# Patient Record
Sex: Male | Born: 1950 | Race: Black or African American | Hispanic: No | Marital: Single | Smoking: Current every day smoker
Health system: Southern US, Community
[De-identification: ages and names within clinical notes are randomized; demographics above are authoritative.]

## PROBLEM LIST (undated history)

## (undated) DIAGNOSIS — I10 Essential (primary) hypertension: Secondary | ICD-10-CM

## (undated) DIAGNOSIS — Z972 Presence of dental prosthetic device (complete) (partial): Secondary | ICD-10-CM

## (undated) DIAGNOSIS — E119 Type 2 diabetes mellitus without complications: Secondary | ICD-10-CM

## (undated) DIAGNOSIS — Z8673 Personal history of transient ischemic attack (TIA), and cerebral infarction without residual deficits: Secondary | ICD-10-CM

## (undated) DIAGNOSIS — E785 Hyperlipidemia, unspecified: Secondary | ICD-10-CM

## (undated) DIAGNOSIS — J449 Chronic obstructive pulmonary disease, unspecified: Secondary | ICD-10-CM

## (undated) DIAGNOSIS — K219 Gastro-esophageal reflux disease without esophagitis: Secondary | ICD-10-CM

## (undated) DIAGNOSIS — G4733 Obstructive sleep apnea (adult) (pediatric): Secondary | ICD-10-CM

## (undated) HISTORY — DX: Essential (primary) hypertension: I10

## (undated) HISTORY — DX: Gastro-esophageal reflux disease without esophagitis: K21.9

## (undated) HISTORY — DX: Type 2 diabetes mellitus without complications: E11.9

## (undated) HISTORY — DX: Obstructive sleep apnea (adult) (pediatric): G47.33

## (undated) HISTORY — DX: Personal history of transient ischemic attack (TIA), and cerebral infarction without residual deficits: Z86.73

## (undated) HISTORY — DX: Hyperlipidemia, unspecified: E78.5

---

## 2001-08-03 DIAGNOSIS — Z8673 Personal history of transient ischemic attack (TIA), and cerebral infarction without residual deficits: Secondary | ICD-10-CM

## 2001-08-03 HISTORY — DX: Personal history of transient ischemic attack (TIA), and cerebral infarction without residual deficits: Z86.73

## 2002-04-27 ENCOUNTER — Inpatient Hospital Stay (HOSPITAL_COMMUNITY): Admission: EM | Admit: 2002-04-27 | Discharge: 2002-05-02 | Payer: Self-pay | Admitting: Emergency Medicine

## 2002-04-27 ENCOUNTER — Encounter: Payer: Self-pay | Admitting: *Deleted

## 2002-04-27 ENCOUNTER — Encounter: Payer: Self-pay | Admitting: Emergency Medicine

## 2002-04-28 ENCOUNTER — Encounter: Payer: Self-pay | Admitting: Neurology

## 2002-05-01 ENCOUNTER — Encounter: Payer: Self-pay | Admitting: *Deleted

## 2002-05-02 ENCOUNTER — Inpatient Hospital Stay (HOSPITAL_COMMUNITY)
Admission: RE | Admit: 2002-05-02 | Discharge: 2002-05-10 | Payer: Self-pay | Admitting: Physical Medicine & Rehabilitation

## 2003-03-07 ENCOUNTER — Encounter: Payer: Self-pay | Admitting: Emergency Medicine

## 2003-03-07 ENCOUNTER — Emergency Department (HOSPITAL_COMMUNITY): Admission: EM | Admit: 2003-03-07 | Discharge: 2003-03-07 | Payer: Self-pay | Admitting: Emergency Medicine

## 2004-04-11 ENCOUNTER — Ambulatory Visit: Payer: Self-pay | Admitting: Internal Medicine

## 2004-04-29 ENCOUNTER — Ambulatory Visit: Payer: Self-pay | Admitting: Internal Medicine

## 2004-05-28 ENCOUNTER — Encounter
Admission: RE | Admit: 2004-05-28 | Discharge: 2004-08-26 | Payer: Self-pay | Admitting: Physical Medicine & Rehabilitation

## 2004-05-30 ENCOUNTER — Ambulatory Visit: Payer: Self-pay | Admitting: Physical Medicine & Rehabilitation

## 2004-06-09 ENCOUNTER — Ambulatory Visit: Payer: Self-pay | Admitting: Physical Medicine & Rehabilitation

## 2004-07-08 ENCOUNTER — Ambulatory Visit: Payer: Self-pay | Admitting: Internal Medicine

## 2004-09-30 ENCOUNTER — Ambulatory Visit: Payer: Self-pay | Admitting: Internal Medicine

## 2005-04-21 ENCOUNTER — Ambulatory Visit (HOSPITAL_COMMUNITY): Admission: RE | Admit: 2005-04-21 | Discharge: 2005-04-21 | Payer: Self-pay | Admitting: Gastroenterology

## 2005-05-20 ENCOUNTER — Encounter: Admission: RE | Admit: 2005-05-20 | Discharge: 2005-05-20 | Payer: Self-pay | Admitting: Gastroenterology

## 2005-06-04 ENCOUNTER — Ambulatory Visit (HOSPITAL_COMMUNITY): Admission: RE | Admit: 2005-06-04 | Discharge: 2005-06-04 | Payer: Self-pay | Admitting: Gastroenterology

## 2010-03-20 ENCOUNTER — Encounter: Admission: RE | Admit: 2010-03-20 | Discharge: 2010-03-20 | Payer: Self-pay | Admitting: Internal Medicine

## 2010-12-19 NOTE — Group Therapy Note (Signed)
Terry Andrews is a 60 year old adult male referred to this office by  [facility], specifically Dr. Philipp Deputy, for evaluation and treatment of left  foot numbness.  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  A April 28, 2002, MRI scan showed a moderate sized,  acute, non-hemorrhagic infarct of the right pons.  He was hospitalized  April 27, 2002 through May 10, 2002.  He was discharged on aspirin  and Plavix.  He has not been seen in this office since June 09, 2002.  The patient has been receiving health care through [facility] with Dr.  Philipp Deputy.  He has persisted with lateral foot numbness on the left side since  the time of the stroke.  He was started on Neurontin, by Dr. Philipp Deputy,  approximately five weeks ago and has increased to 300 mg t.i.d.  That has  been present for about a month now but his numbness is questionably  improved.  He is really not a good historian in terms of noting any  improvement with the Neurontin medication.  He is interested in evaluation  for that numbness.  He is also interested in possible nerve conduction  studies, although he is not interested in an EMG study.  He has been told by  Dr. Philipp Deputy that the nerve conduction study could be done.  He seems to have  an understanding that the nerve conduction study would be a therapeutic  test, but I explained to him that it is only diagnostic.  The numbness is  located over his left lateral foot, over the dorsum of his foot, and he  reports that it is somewhat bothersome.  Again, he is not sure if he has had  any improvement on the Neurontin medication used 900 mg a day.  He has had a  recent hemoglobin A1c measured at 6.6, November 2004.   PAST MEDICAL HISTORY:  1.  Hypertension.  2.  Positive tobacco abuse.  3.  Positive drug use.  4.  Borderline diabetes mellitus.   MEDICATIONS:  1.  Neurontin 300 mg t.i.d.  2.  Plavix 75 mg every day.  3.  Norvasc 10 mg every  day.  4.  Enalapril 10 mg every day.  5.  Hydrochlorothiazide 25 mg every day.  6.  Aspirin 81 mg every day.   SOCIAL HISTORY:  The patient lives with his fiancee in [place].  He was  independent prior to this stroke.  He is not employed at the present time.  He previously installed floors.   ALLERGIES:  No known drug allergies.   PHYSICAL EXAMINATION:  GENERAL:  A well-appearing adult male.  VITAL SIGNS:  Blood pressure 154/81, with a pulse of 75, respiratory rate  16, and O2 saturation 97% on room air.  MUSCULOSKELETAL:  Right sided strength was 5/5 throughout.  Bulk and tone  were normal and reflexes were 2+ and symmetrical.  Left arm strength was 5-  to 5 over 5 throughout.  Bulk and tone were normal and reflexes were 2+ and  symmetrical.  Left lower extremity strength was 5-/5 in hip flexion and knee  extension.  Sensation was intact to light touch throughout the entire left  lower extremity  with the exception of the dorsum of his left foot.  Reflexes were 2+ and symmetrical.  He ambulates without any assistive  device.   IMPRESSION:  Left lateral foot numbness, questionably related to prior  stroke with borderline diabetes mellitus noted.  At  the present time we have  requested a nerve conduction study of his left lower extremity  to rule out  small fiber neuropathy.  He does have a hemoglobin A1c with only mildly  borderline back in November.  It does appear that the numbness has been  present since the time of the stroke and probably is related to that.   In any event, we will proceed with treatment at the present time.  I have  asked him to stop the Neurontin, and we are going to try Lyrica at 75 mg  twice a day.  It is a new medication that may help him with his neuropathy,  may help the numbness.  We will see how he does, and I have asked him to  monitor the status of his numbness well over the next month to see if it is  any better or worse compared to the Neurontin  medication.  No other  treatment is recommended at the present time.  We will see what the results  of the nerve conduction study are.  We have not suggested EMG study as he is  not in need of the study and would not tolerate that well.  We have given  him samples of the Lyrica in the office today.  This should cover him for  the next month and we will plan on seeing him in followup in approximately  one to two month's time.      DC/MedQ  D:  05/30/2004 12:24:00  T:  05/30/2004 15:38:59  Job #:  [id number]

## 2013-06-05 ENCOUNTER — Ambulatory Visit
Admission: RE | Admit: 2013-06-05 | Discharge: 2013-06-05 | Disposition: A | Discharge status: Home / Self Care | Payer: Medicare Other | Source: Ambulatory Visit | Attending: Internal Medicine | Admitting: Internal Medicine

## 2013-06-05 ENCOUNTER — Other Ambulatory Visit: Payer: Self-pay | Admitting: Internal Medicine

## 2013-06-05 DIAGNOSIS — R05 Cough: Secondary | ICD-10-CM

## 2014-09-19 DIAGNOSIS — R7309 Other abnormal glucose: Secondary | ICD-10-CM | POA: Diagnosis not present

## 2014-09-19 DIAGNOSIS — F17200 Nicotine dependence, unspecified, uncomplicated: Secondary | ICD-10-CM | POA: Diagnosis not present

## 2014-09-19 DIAGNOSIS — I119 Hypertensive heart disease without heart failure: Secondary | ICD-10-CM | POA: Diagnosis not present

## 2014-10-15 DIAGNOSIS — I10 Essential (primary) hypertension: Secondary | ICD-10-CM | POA: Diagnosis not present

## 2014-11-15 ENCOUNTER — Encounter: Payer: Self-pay | Admitting: Neurology

## 2014-11-15 ENCOUNTER — Ambulatory Visit (INDEPENDENT_AMBULATORY_CARE_PROVIDER_SITE_OTHER): Payer: Medicare Other | Admitting: Neurology

## 2014-11-15 VITALS — BP 122/80 | HR 74 | Resp 18 | Ht 75.0 in | Wt 268.0 lb

## 2014-11-15 DIAGNOSIS — Z720 Tobacco use: Secondary | ICD-10-CM

## 2014-11-15 DIAGNOSIS — I6350 Cerebral infarction due to unspecified occlusion or stenosis of unspecified cerebral artery: Secondary | ICD-10-CM | POA: Diagnosis not present

## 2014-11-15 DIAGNOSIS — F17200 Nicotine dependence, unspecified, uncomplicated: Secondary | ICD-10-CM

## 2014-11-15 DIAGNOSIS — E669 Obesity, unspecified: Secondary | ICD-10-CM | POA: Diagnosis not present

## 2014-11-15 DIAGNOSIS — G4733 Obstructive sleep apnea (adult) (pediatric): Secondary | ICD-10-CM

## 2014-11-15 NOTE — Patient Instructions (Addendum)
You have obstructive sleep apnea or OSA, and I think we should proceed with a sleep study to determine how severe it is. If you have more than mild OSA, I want you to consider continuation with CPAP. Please remember, the risks and ramifications of moderate to severe obstructive sleep apnea or OSA are: Cardiovascular disease, including congestive heart failure, stroke, difficult to control hypertension, arrhythmias, and even type 2 diabetes has been linked to untreated OSA. Sleep apnea causes disruption of sleep and sleep deprivation in most cases, which, in turn, can cause recurrent headaches, problems with memory, mood, concentration, focus, and vigilance. Most people with untreated sleep apnea report excessive daytime sleepiness, which can affect their ability to drive. Please do not drive if you feel sleepy.  I will see you back after your sleep study to go over the test results and where to go from there. We will call you after your sleep study and to set up an appointment at the time.   Please remember to try to maintain good sleep hygiene, which means: Keep a regular sleep and wake schedule, try not to exercise or have a meal within 2 hours of your bedtime, try to keep your bedroom conducive for sleep, that is, cool and dark, without light distractors such as an illuminated alarm clock, and refrain from watching TV right before sleep or in the middle of the night and do not keep the TV or radio on during the night. Also, try not to use or play on electronic devices at bedtime, such as your cell phone, tablet PC or laptop. If you like to read at bedtime on an electronic device, try to dim the background light as much as possible. Do not eat in the middle of the night.

## 2014-11-15 NOTE — Progress Notes (Signed)
Subjective:    [name] is a 64 y.o. male.  HPI     Star Age, MD, PhD [facility] [address], [address] [place], [place] [address]  Dear Ulice Dash,   I saw your patient, Terry Andrews, upon your kind request in my neurologic clinic today for initial consultation of his obstructive sleep apnea which has been treated with CPAP. The patient is unaccompanied today. As you know, Terry Andrews is a very pleasant 64 year old right-handed gentleman with an underlying medical history of reflux disease, hypertension, smoking, heart disease and history of right pontine stroke in 2003 in the context of medical noncompliance and cocaine use (per patient), with no residual weakness, as well as obesity, who was diagnosed with obstructive sleep apnea about 10 years ago. He was placed on CPAP therapy. He has been compliant with treatment. About 2 or 3 months ago he started noticing that the machine was making a lot of noise and he actually quit using it. You encouraged him to restart using it and he also had a bad cough and bout of congestion which since then has improved but it made it hard for him to be compliant with CPAP therapy at the time. All in all he estimates that he has over the course of 10 years always been compliant with treatment about 85% of the time. He uses nasal pillows. He did not bring his machine today.  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 He is single. He has 2 grown daughters. He is the youngest of a total of 7 children. One sister died of breast cancer. Another sister has breast cancer. His brother has sleep apnea and encouraged him to get tested in the past. He still smokes and is not motivated to quit. He lost some weight with your encouragement. He does not have a set bedtime and wake time routine. He drinks caffeine in the form of coffee, tea and sodas throughout the day. He has  a TV on at night throughout the night typically. He likes to sleep on his sides. He likes to take a nap in the afternoons. He is not known to have any parasomnias. He denies restless leg symptoms, morning headaches and nocturia. He wakes up typically quite well rested when he is able to use CPAP but again lately, his machine has been defective.  His DME company is [facility].   Prior sleep test results are not available for my review today. I reviewed your office note from 10/15/2014 which you kindly included. Prior test results were also reviewed including carotid Doppler studies from 04/13/2011 which showed no significant stenosis of either internal carotid artery. Echocardiogram from 04/16/2011 showed normal EF, moderate LVH, grade 2 diastolic dysfunction. According to your office note he was diagnosed with obstructive sleep apnea on 05/21/2015 with a sleep study. Labs from 06/07/2014 showed normal TSH, A1c of 6.6, total cholesterol of 130, LDL at 71, CMP and CBC normal. EKG from 05/03/2014 showed normal sinus rhythm with borderline first-degree AV block and some PVCs. His CPAP has been nearly 62 years old and he has not had re-evaluation of his OSA and nearly 10 years.  His Past Medical History Is Significant For: Past Medical History  Diagnosis Date  . Hypertension   . GERD (gastroesophageal reflux disease)   . OSA (obstructive sleep apnea)   . H/O: CVA (cerebrovascular accident)  His Past Surgical History Is Significant For: No past surgical history on file.  His Family History Is Significant For: Family History  Problem Relation Age of Onset  . Cancer Mother   . Stroke Father   . Heart disease Father     His Social History Is Significant For: History   Social History  . Marital Status: Single    Spouse Name: N/A  . Number of Children: 2  . Years of Education: [employer]   Social History Main Topics  . Smoking status: Current Every Day Smoker  . Smokeless tobacco:  Not on file  . Alcohol Use: 0.0 oz/week    0 Standard drinks or equivalent per week     Comment: Occasional   . Drug Use: No  . Sexual Activity: Not on file   Other Topics Concern  . None   Social History Narrative   Some caffeine use     His Allergies Are:  No Known Allergies:   His Current Medications Are:  Outpatient Encounter Prescriptions as of 11/15/2014  Medication Sig  . amLODipine (NORVASC) 10 MG tablet   . aspirin 81 MG tablet Take 81 mg by mouth daily.  . Azilsartan-Chlorthalidone 40-25 MG TABS Take by mouth.  . metoprolol succinate (TOPROL-XL) 25 MG 24 hr tablet Take 25 mg by mouth daily.  Marland Kitchen spironolactone (ALDACTONE) 25 MG tablet Take 25 mg by mouth daily.  . [DISCONTINUED] acebutolol (SECTRAL) 400 MG capsule Take 400 mg by mouth 2 (two) times daily.  . [DISCONTINUED] amLODipine-benazepril (LOTREL) 10-20 MG per capsule Take 1 capsule by mouth daily.  . [DISCONTINUED] lansoprazole (PREVACID) 30 MG capsule Take 30 mg by mouth daily at 12 noon.  . [DISCONTINUED] Multiple Vitamin (MULTIVITAMIN) capsule Take 1 capsule by mouth daily.  . [DISCONTINUED] Omega-3 Fatty Acids (FISH OIL PO) Take by mouth.  . [DISCONTINUED] verapamil (COVERA HS) 240 MG (CO) 24 hr tablet Take 240 mg by mouth at bedtime.  :  Review of Systems:  Out of a complete 14 point review of systems, all are reviewed and negative with the exception of these symptoms as listed below:   Review of Systems  Respiratory:       Snoring   Cardiovascular:       Heart murmur    Objective:  Neurologic Exam  Physical Exam Physical Examination:   Filed Vitals:   11/15/14 0832  BP: 122/80  Pulse: 74  Resp: 18   General Examination: The patient is a very pleasant 64 y.o. male in no acute distress. He appears well-developed and well-nourished and well groomed.   HEENT: Normocephalic, atraumatic, pupils are equal, round and reactive to light and accommodation. Funduscopic exam is normal with sharp disc  margins noted. Extraocular tracking is good without limitation to gaze excursion or nystagmus noted. Normal smooth pursuit is noted. Hearing is grossly intact. Tympanic membranes are clear bilaterally. Face is symmetric with normal facial animation and normal facial sensation. Speech is clear with no dysarthria noted. There is no hypophonia. There is no lip, neck/head, jaw or voice tremor. Neck is supple with full range of passive and active motion. There are no carotid bruits on auscultation. Oropharynx exam reveals: mild mouth dryness, adequate dental hygiene and marked airway crowding, due to large tongue, thick soft palate and large uvula. He has a sensitive gag and tonsils are not visualized. Mallampati is class III. Tongue protrudes centrally and palate elevates symmetrically. Neck size is 18.25 inches. He has a Mild overbite. Nasal inspection reveals  no significant nasal mucosal bogginess or redness and no septal deviation.   Chest: Clear to auscultation without wheezing, rhonchi or crackles noted.  Heart: S1+S2+0, regular and normal without murmurs, rubs or gallops noted.   Abdomen: Soft, non-tender and non-distended with normal bowel sounds appreciated on auscultation.  Extremities: There is no pitting edema in the distal lower extremities bilaterally. Pedal pulses are intact.  Skin: Warm and dry without trophic changes noted. There are no varicose veins. Signs of acne/rosacea.  Musculoskeletal: exam reveals no obvious joint deformities, tenderness or joint swelling or erythema.   Neurologically:  Mental status: The patient is awake, alert and oriented in all 4 spheres. His immediate and remote memory, attention, language skills and fund of knowledge are appropriate. There is no evidence of aphasia, agnosia, apraxia or anomia. Speech is clear with normal prosody and enunciation. Thought process is linear. Mood is normal and affect is normal.  Cranial nerves II - XII are as described above  under HEENT exam. In addition: shoulder shrug is normal with equal shoulder height noted. Motor exam: Normal bulk, strength and tone is noted. There is no drift, tremor or rebound. Romberg is negative. Reflexes are 2+ throughout. Babinski: Toes are flexor bilaterally. Fine motor skills and coordination: intact with normal finger taps, normal hand movements, normal rapid alternating patting, normal foot taps and normal foot agility.  Cerebellar testing: No dysmetria or intention tremor on finger to nose testing. Heel to shin is unremarkable bilaterally. There is no truncal or gait ataxia.  Sensory exam: intact to light touch, pinprick, vibration, temperature sense in the upper and lower extremities.  Gait, station and balance: He stands easily. No veering to one side is noted. No leaning to one side is noted. Posture is age-appropriate and stance is narrow based. Gait shows normal stride length and normal pace. No problems turning are noted. He turns en bloc. Tandem walk is  slightly difficult for him  Assessment and Plan:   In summary, Terry Andrews is a very pleasant 64 y.o.-year old male with an underlying medical history of reflux disease, hypertension, smoking, heart disease and history of right pontine stroke in 2003 in the context of medical noncompliance and cocaine use (per patient), with no residual weakness, as well as obesity, who was diagnosed with obstructive sleep apnea about 10 years ago. He was placed on CPAP therapy. He has been compliant with treatment. However, his machinist effective and he needs a new evaluation since he has not had a sleep study and almost 10 years and a new machine because it is almost 62 years old. He is accustomed to using nasal pillows.  I had a long chat with the patient about my findings and the diagnosis of OSA, its prognosis and treatment options. We talked about medical treatments, surgical interventions and non-pharmacological approaches. I explained in  particular the risks and ramifications of untreated moderate to severe OSA, especially with respect to developing cardiovascular disease down the Road, including congestive heart failure, difficult to treat hypertension, cardiac arrhythmias, or stroke. Even type 2 diabetes has, in part, been linked to untreated OSA. Symptoms of untreated OSA include daytime sleepiness, memory problems, mood irritability and mood disorder such as depression and anxiety, lack of energy, as well as recurrent headaches, especially morning headaches. We talked about smoking cessation and trying to maintain a healthy lifestyle in general, as well as the importance of weight control. I encouraged the patient to eat healthy, exercise daily and keep well hydrated, to  keep a scheduled bedtime and wake time routine, to not skip any meals and eat healthy snacks in between meals. I advised the patient not to drive when feeling sleepy.  I recommended the following at this time: sleep study with potential positive airway pressure titration. (We will score hypopneas at 4% and split the sleep study into diagnostic and treatment portion, if the estimated. 2 hour AHI is >20/h).   I explained the sleep test procedure to the patient and also outlined possible surgical and non-surgical treatment options of OSA, including the use of a custom-made dental device (which would require a referral to a specialist dentist or oral surgeon), upper airway surgical options, such as pillar implants, radiofrequency surgery, tongue base surgery, and UPPP (which would involve a referral to an ENT surgeon). Rarely, jaw surgery such as mandibular advancement may be considered.  I also explained the CPAP treatment option to the patient, who indicated that he would be willing to continue with CPAP therapy. I explained the importance of being compliant with PAP treatment, not only for insurance purposes but primarily to improve His symptoms, and for the patient's long  term health benefit, including to reduce His cardiovascular risks. I answered all his questions today and the patient was in agreement. I would like to see him back after the sleep study is completed and encouraged him to call with any interim questions, concerns, problems or updates.   Thank you very much for allowing me to participate in the care of this nice patient. If I can be of any further assistance to you please do not hesitate to call me at [phone].  Sincerely,   Star Age, MD, PhD

## 2014-11-22 ENCOUNTER — Ambulatory Visit (INDEPENDENT_AMBULATORY_CARE_PROVIDER_SITE_OTHER): Payer: Medicare Other | Admitting: Neurology

## 2014-11-22 VITALS — BP 135/73 | HR 86 | Ht 75.0 in | Wt 268.0 lb

## 2014-11-22 DIAGNOSIS — G4733 Obstructive sleep apnea (adult) (pediatric): Secondary | ICD-10-CM | POA: Diagnosis not present

## 2014-11-22 DIAGNOSIS — G4720 Circadian rhythm sleep disorder, unspecified type: Secondary | ICD-10-CM

## 2014-11-22 DIAGNOSIS — G478 Other sleep disorders: Secondary | ICD-10-CM

## 2014-11-22 DIAGNOSIS — G479 Sleep disorder, unspecified: Secondary | ICD-10-CM

## 2014-11-22 DIAGNOSIS — R9431 Abnormal electrocardiogram [ECG] [EKG]: Secondary | ICD-10-CM

## 2014-11-23 NOTE — Sleep Study (Signed)
See scanned document in Encounters tab

## 2014-12-05 ENCOUNTER — Telehealth: Payer: Self-pay | Admitting: Neurology

## 2014-12-05 NOTE — Telephone Encounter (Signed)
Please call and notify the patient that the recent sleep study did not show any significant obstructive sleep apnea. Please inform patient that I would like to go over the details of the study during a follow up appointment and if not already previously scheduled, arrange a followup appointment (please utilize a followu-up slot). Also, route or fax report to Dr. Einar Gip.  Once you have spoken to patient, you can close this encounter.   Thanks,  Star Age, MD, PhD [facility])

## 2014-12-06 NOTE — Telephone Encounter (Signed)
I spoke with patient. He is aware of results and made appt for 6/3. I will fax report to Dr. Einar Gip.

## 2015-01-04 ENCOUNTER — Ambulatory Visit (INDEPENDENT_AMBULATORY_CARE_PROVIDER_SITE_OTHER): Payer: Medicare Other | Admitting: Neurology

## 2015-01-04 ENCOUNTER — Encounter: Payer: Self-pay | Admitting: Neurology

## 2015-01-04 VITALS — BP 128/72 | HR 78 | Resp 16 | Ht 75.0 in | Wt 259.0 lb

## 2015-01-04 DIAGNOSIS — E669 Obesity, unspecified: Secondary | ICD-10-CM

## 2015-01-04 DIAGNOSIS — G4733 Obstructive sleep apnea (adult) (pediatric): Secondary | ICD-10-CM

## 2015-01-04 DIAGNOSIS — I6350 Cerebral infarction due to unspecified occlusion or stenosis of unspecified cerebral artery: Secondary | ICD-10-CM

## 2015-01-04 DIAGNOSIS — Z9989 Dependence on other enabling machines and devices: Principal | ICD-10-CM

## 2015-01-04 NOTE — Patient Instructions (Signed)
I will prescribe a new CPAP machine for you. I have placed an order in the chart and we will fax the order to [facility].   I will see you back in 3 month check up.   Please bring your SD card for [name] to read next week. Leave it with the front desk and put Attn: Dr. Rexene Alberts and Beverlee Nims.

## 2015-01-04 NOTE — Progress Notes (Signed)
Subjective:    [name] is a 64 y.o. male.  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 The patient is unaccompanied today. I first met him on 11/15/2014 at the request of his cardiologist, at which time the patient reported a prior diagnosis of obstructive sleep apnea and treatment with CPAP for the past 10 years. He had noticed problems with his machine. Prior sleep test results were not available for me. His sleep study was over 10 years ago. I suggested he return for a potential split-night sleep study. He ended up having a baseline sleep study on 11/22/2014 and I went over his test results with him in detail today. His sleep efficiency was reduced markedly at 53%. Of note the patient had difficulty maintaining sleep and particularly had a hard time going back to sleep after 2:15 AM. Sleep latency was normal but wake after sleep onset was nearly 3 hours. There was mild to moderate sleep fragmentation. He had an increased percentage of stage II sleep, and a decreased percentage of REM sleep at 9.7%. He had no significant PLMS. He had occasional PVCs on single-lead EKG. He had mild intermittent snoring. Total AHI was borderline at 4.5 per hour, rising to 32.2 per hour during REM sleep and 6.7 per hour in the supine position. He slept mostly in the supine position. Average oxygen saturation was 90% but there were faulty readings as well on the pulse oximeter throughout the night.   Today, 01/04/2015: He reports feeling about the same. He did not bring his machine or SD card. He has no new sleep  related complaints. He has lost a few pounds. He feels better using CPAP. His brother has severe OSA and uses a CPAP too.   Previously:   He was diagnosed with obstructive sleep apnea about 10 years ago. He was placed on CPAP therapy. He has been compliant with treatment. About 2 or 3 months ago he started noticing that the machine was making a lot of noise and he actually quit using it. You encouraged him to restart using it and he also had a bad cough and bout of congestion which since then has improved but it made it hard for him to be compliant with CPAP therapy at the time. All in all he estimates that he has over the course of 10 years always been compliant with treatment about 85% of the time. He uses nasal pillows. He did not bring his machine today.   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 He is single. He has 2 grown daughters. He is the youngest of a total of 7 children. One sister died of breast cancer. Another sister has breast cancer. His brother has sleep apnea and encouraged him to get tested in the past. He still smokes and is not motivated to quit. He lost some weight with your encouragement. He does not have a set bedtime and wake time routine. He drinks caffeine in the form of coffee, tea and sodas throughout the day. He has a TV  on at night throughout the night typically. He likes to sleep on his sides. He likes to take a nap in the afternoons. He is not known to have any parasomnias. He denies restless leg symptoms, morning headaches and nocturia. He wakes up typically quite well rested when he is able to use CPAP but again lately, his machine has been defective.   His DME company is [facility].   Prior sleep test results are not available for my review today. I reviewed your office note from 10/15/2014 which you kindly included. Prior test results were also reviewed including carotid Doppler studies from 04/13/2011  which showed no significant stenosis of either internal carotid artery. Echocardiogram from 04/16/2011 showed normal EF, moderate LVH, grade 2 diastolic dysfunction. According to your office note he was diagnosed with obstructive sleep apnea on 05/21/2015 with a sleep study. Labs from 06/07/2014 showed normal TSH, A1c of 6.6, total cholesterol of 130, LDL at 71, CMP and CBC normal. EKG from 05/03/2014 showed normal sinus rhythm with borderline first-degree AV block and some PVCs. His CPAP has been nearly 62 years old and he has not had re-evaluation of his OSA and nearly 10 years.  His Past Medical History Is Significant For: Past Medical History  Diagnosis Date  . Hypertension   . GERD (gastroesophageal reflux disease)   . OSA (obstructive sleep apnea)   . H/O: CVA (cerebrovascular accident)     Her Past Surgical History Is Significant For: No past surgical history on file.  His Family History Is Significant For: Family History  Problem Relation Age of Onset  . Cancer Mother   . Stroke Father   . Heart disease Father     His Social History Is Significant For: History   Social History  . Marital Status: Single    Spouse Name: N/A  . Number of Children: 2  . Years of Education: [employer]   Social History Main Topics  . Smoking status: Current Every Day Smoker  . Smokeless tobacco: Not on file  . Alcohol Use: 0.0 oz/week    0 Standard drinks or equivalent per week     Comment: Occasional   . Drug Use: No  . Sexual Activity: Not on file   Other Topics Concern  . None   Social History Narrative   Some caffeine use     His Allergies Are:  No Known Allergies:   His Current Medications Are:  Outpatient Encounter Prescriptions as of 01/04/2015  Medication Sig  . amLODipine (NORVASC) 10 MG tablet   . aspirin 81 MG tablet Take 81 mg by mouth daily.  . Azilsartan-Chlorthalidone 40-25 MG TABS Take by mouth.  . multivitamin-iron-minerals-folic acid (CENTRUM) chewable tablet  Chew 1 tablet by mouth daily.  Marland Kitchen spironolactone (ALDACTONE) 25 MG tablet Take 25 mg by mouth daily.  . verapamil (CALAN-SR) 240 MG CR tablet   . [DISCONTINUED] metoprolol succinate (TOPROL-XL) 25 MG 24 hr tablet Take 25 mg by mouth daily.   No facility-administered encounter medications on file as of 01/04/2015.  :  Review of Systems:  Out of a complete 14 point review of systems, all are reviewed and negative with the exception of these symptoms as listed below:  Review of Systems  All other systems reviewed and are negative.   Objective:  Neurologic Exam  Physical Exam Physical Examination:   Filed Vitals:   01/04/15 1137  BP: 128/72  Pulse: 78  Resp: 16   General Examination: The patient is a very  pleasant 64 y.o. male in no acute distress. He appears well-developed and well-nourished and well groomed. He is in good spirits today.  HEENT: Normocephalic, atraumatic, pupils are equal, round and reactive to light and accommodation. Funduscopic exam is normal with sharp disc margins noted. Extraocular tracking is good without limitation to gaze excursion or nystagmus noted. Normal smooth pursuit is noted. Hearing is grossly intact. Face is symmetric with normal facial animation and normal facial sensation. Speech is clear with no dysarthria noted. There is no hypophonia. There is no lip, neck/head, jaw or voice tremor. Neck is supple with full range of passive and active motion. There are no carotid bruits on auscultation. Oropharynx exam reveals: mild mouth dryness, adequate dental hygiene and marked airway crowding, due to large tongue, thick soft palate and large uvula. He has a sensitive gag and tonsils are not visualized. Mallampati is class III. Tongue protrudes centrally and palate elevates symmetrically. He has a Mild overbite.   Chest: Clear to auscultation without wheezing, rhonchi or crackles noted.  Heart: S1+S2+0, regular and normal without murmurs, rubs or gallops noted.    Abdomen: Soft, non-tender and non-distended with normal bowel sounds appreciated on auscultation.  Extremities: There is no pitting edema in the distal lower extremities bilaterally. Pedal pulses are intact.  Skin: Warm and dry without trophic changes noted. There are no varicose veins. Signs of acne/rosacea.  Musculoskeletal: exam reveals no obvious joint deformities, tenderness or joint swelling or erythema.   Neurologically:  Mental status: The patient is awake, alert and oriented in all 4 spheres. His immediate and remote memory, attention, language skills and fund of knowledge are appropriate. There is no evidence of aphasia, agnosia, apraxia or anomia. Speech is clear with normal prosody and enunciation. Thought process is linear. Mood is normal and affect is normal.  Cranial nerves II - XII are as described above under HEENT exam. In addition: shoulder shrug is normal with equal shoulder height noted. Motor exam: Normal bulk, strength and tone is noted. There is no drift, tremor or rebound. Romberg is negative. Reflexes are 2+ throughout. Babinski: Toes are flexor bilaterally. Fine motor skills and coordination: intact with normal finger taps, normal hand movements, normal rapid alternating patting, normal foot taps and normal foot agility.  Cerebellar testing: No dysmetria or intention tremor on finger to nose testing. Heel to shin is unremarkable bilaterally. There is no truncal or gait ataxia.  Sensory exam: intact to light touch.  Gait, station and balance: He stands easily. No veering to one side is noted. No leaning to one side is noted. Posture is age-appropriate and stance is narrow based. Gait shows normal stride length and normal pace. No problems turning are noted. He turns en bloc. Tandem walk is  slightly difficult for him, unchanged.  Assessment and Plan:   In summary, Terry Andrews is a very pleasant 64 year old male with an underlying medical history of reflux disease,  hypertension, smoking, heart disease and history of right pontine stroke in 2003 in the context of medical noncompliance and cocaine use (per patient's report), without evidence of residual weakness, as well as obesity, who  Presents for follow-up consultation of his obstructive sleep apnea. This was originally diagnosed over 10 years ago. He was issued a CPAP machine at the time and he still has the original machine. While he has been compliant with treatment, his machine no longer works properly. He needs a replacement machine. Unfortunately, his latest sleep study did not show enough sleep and  we could not titrate him with CPAP during the study. Today, I suggested we placed him on AutoPap therapy at home and I will see him back in follow-up with a compliance download. I will see him back in about 3 months, sooner if the need arises. I answered all his questions today and he was in agreement. I spent 20 minutes in total face-to-face time with the patient, more than 50% of which was spent in counseling and coordination of care, reviewing test results, reviewing medication and discussing or reviewing the diagnosis of OSA, its prognosis and treatment options.

## 2015-01-08 ENCOUNTER — Encounter: Payer: Self-pay | Admitting: Neurology

## 2015-04-17 ENCOUNTER — Ambulatory Visit: Payer: Medicare Other | Admitting: Neurology

## 2015-05-06 ENCOUNTER — Ambulatory Visit (INDEPENDENT_AMBULATORY_CARE_PROVIDER_SITE_OTHER): Payer: Medicare Other | Admitting: Neurology

## 2015-05-06 ENCOUNTER — Encounter: Payer: Self-pay | Admitting: Neurology

## 2015-05-06 VITALS — BP 122/70 | HR 76 | Resp 18 | Ht 75.0 in | Wt 270.0 lb

## 2015-05-06 DIAGNOSIS — E669 Obesity, unspecified: Secondary | ICD-10-CM | POA: Diagnosis not present

## 2015-05-06 DIAGNOSIS — Z720 Tobacco use: Secondary | ICD-10-CM | POA: Diagnosis not present

## 2015-05-06 DIAGNOSIS — Z9989 Dependence on other enabling machines and devices: Principal | ICD-10-CM

## 2015-05-06 DIAGNOSIS — G4733 Obstructive sleep apnea (adult) (pediatric): Secondary | ICD-10-CM | POA: Diagnosis not present

## 2015-05-06 DIAGNOSIS — F17200 Nicotine dependence, unspecified, uncomplicated: Secondary | ICD-10-CM

## 2015-05-06 DIAGNOSIS — IMO0001 Reserved for inherently not codable concepts without codable children: Secondary | ICD-10-CM

## 2015-05-06 NOTE — Progress Notes (Signed)
Subjective:    [name] is a 64 y.o. male.  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 The patient is unaccompanied today. I last saw him on 01/04/2015, at which time he reported feeling about the same. He did not bring his machine or SD card. He had no new sleep related complaints. He had lost a few pounds. He was feeling better with CPAP and was using his old machine. I suggested we start him on AutoPap therapy and provide him with a new machine. He reported that his brother has severe OSA and uses a CPAP too.   Today, 05/06/2015: I reviewed his CPAP compliance data from 04/01/2015 through 04/30/2015 which is a total of 30 days during which time he used his machine every night except for 1, with percent used days greater than 4 hours at 97%, indicating excellent compliance with an average usage of 6 hours and 38 minutes, residual AHI low at 0.5 per hour, leak acceptable with the 95th percentile at 21.9 L/m, borderline, and 95th percentile of pressure at 8.8 cm, setting of AutoPap pap with a range of 5-15 cm with EPR.  Today, 05/06/2015: He reports doing rather well. He loves his new machine. He is compliant with treatment. He has no new complaints regarding his sleep. He has joint pain particularly in both knees and lower back. He does not see an orthopedic doctor. He sees a Restaurant manager, fast food regularly and his primary care physician he states. He recently saw his cardiologist, Dr. Einar Gip in August and was told to follow-up in one year. Unfortunately, he still smokes. He also smoked some marijuana and  drinks liquor almost on a daily basis but does not get drunk and does not drink during the day he states. He is not ready to quit smoking he states.   Previously:   I first met him on 11/15/2014 at the request of his cardiologist, at which time the patient reported a prior diagnosis of obstructive sleep apnea and treatment with CPAP for the past 10 years. He had noticed problems with his machine. Prior sleep test results were not available for me. His sleep study was over 10 years ago. I suggested he return for a potential split-night sleep study. He ended up having a baseline sleep study on 11/22/2014 and I went over his test results with him in detail today. His sleep efficiency was reduced markedly at 53%. Of note the patient had difficulty maintaining sleep and particularly had a hard time going back to sleep after 2:15 AM. Sleep latency was normal but wake after sleep onset was nearly 3 hours. There was mild to moderate sleep fragmentation. He had an increased percentage of stage II sleep, and a decreased percentage of REM sleep at 9.7%. He had no significant PLMS. He had occasional PVCs on single-lead EKG. He had mild intermittent snoring. Total AHI was borderline at 4.5 per hour, rising to 32.2 per hour during REM sleep and 6.7 per hour in the supine position. He slept mostly in the supine position. Average oxygen saturation was 90% but there were faulty readings as well on the  pulse oximeter throughout the night.    He was diagnosed with obstructive sleep apnea about 10 years ago. He was placed on CPAP therapy. He has been compliant with treatment. About 2 or 3 months ago he started noticing that the machine was making a lot of noise and he actually quit using it. You encouraged him to restart using it and he also had a bad cough and bout of congestion which since then has improved but it made it hard for him to be compliant with CPAP therapy at the time. All in all he estimates that he has over the  course of 10 years always been compliant with treatment about 85% of the time. He uses nasal pillows. He did not bring his machine today.   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 He is single. He has 2 grown daughters. He is the youngest of a total of 7 children. One sister died of breast cancer. Another sister has breast cancer. His brother has sleep apnea and encouraged him to get tested in the past. He still smokes and is not motivated to quit. He lost some weight with your encouragement. He does not have a set bedtime and wake time routine. He drinks caffeine in the form of coffee, tea and sodas throughout the day. He has a TV on at night throughout the night typically. He likes to sleep on his sides. He likes to take a nap in the afternoons. He is not known to have any parasomnias. He denies restless leg symptoms, morning headaches and nocturia. He wakes up typically quite well rested when he is able to use CPAP but again lately, his machine has been defective.   His DME company is [facility].    Prior sleep test results are not available for my review today. I reviewed your office note from 10/15/2014 which you kindly included. Prior test results were also reviewed including carotid Doppler studies from 04/13/2011 which showed no significant stenosis of either internal carotid artery. Echocardiogram from 04/16/2011 showed normal EF, moderate LVH, grade 2 diastolic dysfunction. According to your office note he was diagnosed with obstructive sleep apnea on 05/21/2015 with a sleep study. Labs from 06/07/2014 showed normal TSH, A1c of 6.6, total cholesterol of 130, LDL at 71, CMP and CBC normal. EKG from 05/03/2014 showed normal sinus rhythm with borderline first-degree AV block and some PVCs. His CPAP has been nearly 62 years old and he has not had re-evaluation of his OSA and nearly 10 years.  His Past Medical History Is Significant  For: Past Medical History  Diagnosis Date  . Hypertension   . GERD (gastroesophageal reflux disease)   . OSA (obstructive sleep apnea)   . H/O: CVA (cerebrovascular accident)     His Past Surgical History Is Significant For: No past surgical history on file.  His Family History Is Significant For: Family History  Problem Relation Age of Onset  . Cancer Mother   . Stroke Father   . Heart disease Father     His Social History Is Significant For: Social History   Social History  . Marital Status: Single    Spouse Name: N/A  . Number of Children: 2  . Years of Education: [employer]   Social History Main Topics  . Smoking status: Current Every Day Smoker  . Smokeless tobacco: None  . Alcohol Use: 0.0 oz/week    0 Standard  drinks or equivalent per week     Comment: Occasional   . Drug Use: No  . Sexual Activity: Not Asked   Other Topics Concern  . None   Social History Narrative   Some caffeine use     His Allergies Are:  No Known Allergies:   His Current Medications Are:  Outpatient Encounter Prescriptions as of 05/06/2015  Medication Sig  . aspirin 81 MG tablet Take 81 mg by mouth daily.  . Azilsartan-Chlorthalidone (EDARBYCLOR) 40-25 MG TABS Take by mouth.  . multivitamin-iron-minerals-folic acid (CENTRUM) chewable tablet Chew 1 tablet by mouth daily.  Marland Kitchen spironolactone (ALDACTONE) 25 MG tablet Take 25 mg by mouth daily.  . verapamil (CALAN-SR) 240 MG CR tablet   . [DISCONTINUED] amLODipine (NORVASC) 10 MG tablet   . [DISCONTINUED] Azilsartan-Chlorthalidone 40-25 MG TABS Take by mouth.   No facility-administered encounter medications on file as of 05/06/2015.  :  Review of Systems:  Out of a complete 14 point review of systems, all are reviewed and negative with the exception of these symptoms as listed below:   Review of Systems  Musculoskeletal:       Bilateral knee pain   Neurological:       Patient states that he has gotten a new CPAP machine and is  doing real well on it. No new complaints.     Objective:  Neurologic Exam  Physical Exam Physical Examination:   Filed Vitals:   05/06/15 1049  BP: 122/70  Pulse: 76  Resp: 18   General Examination: The patient is a very pleasant 64 y.o. male in no acute distress. He appears well-developed and well-nourished and well groomed. He is in good spirits today.  HEENT: Normocephalic, atraumatic, pupils are equal, round and reactive to light and accommodation. Extraocular tracking is good without limitation to gaze excursion or nystagmus noted. Normal smooth pursuit is noted. Hearing is grossly intact. Face is symmetric with normal facial animation and normal facial sensation. Speech is clear with no dysarthria noted. There is no hypophonia. There is no lip, neck/head, jaw or voice tremor. Neck is supple with full range of passive and active motion. There are no carotid bruits on auscultation. Oropharynx exam reveals: mild mouth dryness, adequate dental hygiene and marked airway crowding, due to large tongue, thick soft palate and large uvula. He has a sensitive gag and tonsils are not visualized. Mallampati is class III. Tongue protrudes centrally and palate elevates symmetrically. He has a Mild overbite.   Chest: Clear to auscultation without wheezing, rhonchi or crackles noted.  Heart: S1+S2+0, regular and normal without murmurs, rubs or gallops noted.   Abdomen: Soft, non-tender and non-distended with normal bowel sounds appreciated on auscultation.  Extremities: There is no pitting edema in the distal lower extremities bilaterally. Pedal pulses are intact.  Skin: Warm and dry without trophic changes noted. There are no varicose veins. Signs of acne/rosacea.  Musculoskeletal: exam reveals bilateral knee pain. He wears Velcro braces. He has a compression sleeve around his left elbow as he has tenderness in his left elbow he states.    Neurologically:  Mental status: The patient is awake,  alert and oriented in all 4 spheres. His immediate and remote memory, attention, language skills and fund of knowledge are appropriate. There is no evidence of aphasia, agnosia, apraxia or anomia. Speech is clear with normal prosody and enunciation. Thought process is linear. Mood is normal and affect is normal.  Cranial nerves II - XII are as described above under  HEENT exam. In addition: shoulder shrug is normal with equal shoulder height noted. Motor exam: Normal bulk, strength and tone is noted. There is no drift, tremor or rebound. Romberg is negative. Reflexes are 2+ throughout. Fine motor skills and coordination: intact. There is no truncal or gait ataxia.  Sensory exam: intact to light touch.  Gait, station and balance: He stands with mild difficulty secondary to left more than right knee pain. He can stand narrow based. Gait shows normal stride length and normal pace, but a slight limp in the beginning on the left.   Assessment and Plan:   In summary, ABDIAZIZ KLAHN is a very pleasant 64 year old male with an underlying medical history of reflux disease, hypertension, smoking, heart disease and history of right pontine stroke in 2003 in the context of medical noncompliance and cocaine use (per patient's report), without evidence of residual weakness, as well as obesity, who presents for follow-up consultation of his obstructive sleep apnea, now on treatment with AutoPap with a new machine. He was issued to CPAP machine in the past but needed a replacement machine. He had a sleep study in April. He has been compliant with treatment. His physical exam is stable. He is again counseled extensively today. In particular, he is advised to refrain from illicit drugs, stop smoking, drink more water, stay active physically, and be cautious with alcohol use. He is advised to continue using his machine regularly and he is commended for being compliant with treatment. At this juncture, I can see him back in a  year from now. He is encouraged to call with any interim questions or concerns. I answered all his questions today and he was in agreement.  I spent 20 minutes in total face-to-face time with the patient, more than 50% of which was spent in counseling and coordination of care, reviewing test results, reviewing medication and discussing or reviewing the diagnosis of OSA, its prognosis and treatment options.

## 2015-05-06 NOTE — Patient Instructions (Addendum)
Please continue using your auto-PAP regularly. While your insurance requires that you use PAP at least 4 hours each night on 70% of the nights, I recommend, that you not skip any nights and use it throughout the night if you can. Getting used to CPAP and staying with the treatment long term does take time and patience and discipline. Untreated obstructive sleep apnea when it is moderate to severe can have an adverse impact on cardiovascular health and raise her risk for heart disease, arrhythmias, hypertension, congestive heart failure, stroke and diabetes. Untreated obstructive sleep apnea causes sleep disruption, nonrestorative sleep, and sleep deprivation. This can have an impact on your day to day functioning and cause daytime sleepiness and impairment of cognitive function, memory loss, mood disturbance, and problems focussing. Using PAP regularly can improve these symptoms.  Keep up the good work! I will see you back in 12 months for sleep apnea check up.   Please stop smoking, and refrain from illicit drugs, be cautious with alcohol.

## 2015-05-19 DIAGNOSIS — G4733 Obstructive sleep apnea (adult) (pediatric): Secondary | ICD-10-CM | POA: Diagnosis not present

## 2015-06-06 DIAGNOSIS — H2513 Age-related nuclear cataract, bilateral: Secondary | ICD-10-CM | POA: Diagnosis not present

## 2015-06-06 DIAGNOSIS — H43812 Vitreous degeneration, left eye: Secondary | ICD-10-CM | POA: Diagnosis not present

## 2015-06-06 DIAGNOSIS — H25013 Cortical age-related cataract, bilateral: Secondary | ICD-10-CM | POA: Diagnosis not present

## 2015-06-12 DIAGNOSIS — I119 Hypertensive heart disease without heart failure: Secondary | ICD-10-CM | POA: Diagnosis not present

## 2015-06-12 DIAGNOSIS — E669 Obesity, unspecified: Secondary | ICD-10-CM | POA: Diagnosis not present

## 2015-06-12 DIAGNOSIS — Z6835 Body mass index (BMI) 35.0-35.9, adult: Secondary | ICD-10-CM | POA: Diagnosis not present

## 2015-06-12 DIAGNOSIS — R7303 Prediabetes: Secondary | ICD-10-CM | POA: Diagnosis not present

## 2015-06-19 DIAGNOSIS — G4733 Obstructive sleep apnea (adult) (pediatric): Secondary | ICD-10-CM | POA: Diagnosis not present

## 2015-07-19 DIAGNOSIS — G4733 Obstructive sleep apnea (adult) (pediatric): Secondary | ICD-10-CM | POA: Diagnosis not present

## 2015-08-16 DIAGNOSIS — L03312 Cellulitis of back [any part except buttock]: Secondary | ICD-10-CM | POA: Diagnosis not present

## 2015-08-16 DIAGNOSIS — L0390 Cellulitis, unspecified: Secondary | ICD-10-CM | POA: Diagnosis not present

## 2015-08-19 DIAGNOSIS — G4733 Obstructive sleep apnea (adult) (pediatric): Secondary | ICD-10-CM | POA: Diagnosis not present

## 2015-09-13 DIAGNOSIS — R7309 Other abnormal glucose: Secondary | ICD-10-CM | POA: Diagnosis not present

## 2015-09-13 DIAGNOSIS — I119 Hypertensive heart disease without heart failure: Secondary | ICD-10-CM | POA: Diagnosis not present

## 2015-09-13 DIAGNOSIS — R7303 Prediabetes: Secondary | ICD-10-CM | POA: Diagnosis not present

## 2015-09-19 DIAGNOSIS — G4733 Obstructive sleep apnea (adult) (pediatric): Secondary | ICD-10-CM | POA: Diagnosis not present

## 2015-10-17 DIAGNOSIS — G4733 Obstructive sleep apnea (adult) (pediatric): Secondary | ICD-10-CM | POA: Diagnosis not present

## 2015-11-17 DIAGNOSIS — G4733 Obstructive sleep apnea (adult) (pediatric): Secondary | ICD-10-CM | POA: Diagnosis not present

## 2015-12-17 DIAGNOSIS — G4733 Obstructive sleep apnea (adult) (pediatric): Secondary | ICD-10-CM | POA: Diagnosis not present

## 2016-01-02 DIAGNOSIS — I10 Essential (primary) hypertension: Secondary | ICD-10-CM | POA: Diagnosis not present

## 2016-01-02 DIAGNOSIS — E669 Obesity, unspecified: Secondary | ICD-10-CM | POA: Diagnosis not present

## 2016-01-02 DIAGNOSIS — Z0000 Encounter for general adult medical examination without abnormal findings: Secondary | ICD-10-CM | POA: Diagnosis not present

## 2016-01-02 DIAGNOSIS — I119 Hypertensive heart disease without heart failure: Secondary | ICD-10-CM | POA: Diagnosis not present

## 2016-01-02 DIAGNOSIS — R7303 Prediabetes: Secondary | ICD-10-CM | POA: Diagnosis not present

## 2016-02-03 DIAGNOSIS — G4733 Obstructive sleep apnea (adult) (pediatric): Secondary | ICD-10-CM | POA: Diagnosis not present

## 2016-03-13 DIAGNOSIS — I10 Essential (primary) hypertension: Secondary | ICD-10-CM | POA: Diagnosis not present

## 2016-05-05 ENCOUNTER — Encounter: Payer: Self-pay | Admitting: Neurology

## 2016-05-05 ENCOUNTER — Ambulatory Visit (INDEPENDENT_AMBULATORY_CARE_PROVIDER_SITE_OTHER): Payer: Medicare Other | Admitting: Neurology

## 2016-05-05 VITALS — BP 120/62 | HR 70 | Resp 18 | Ht 75.0 in | Wt 268.0 lb

## 2016-05-05 DIAGNOSIS — Z9989 Dependence on other enabling machines and devices: Secondary | ICD-10-CM | POA: Diagnosis not present

## 2016-05-05 DIAGNOSIS — F17200 Nicotine dependence, unspecified, uncomplicated: Secondary | ICD-10-CM

## 2016-05-05 DIAGNOSIS — G4733 Obstructive sleep apnea (adult) (pediatric): Secondary | ICD-10-CM

## 2016-05-05 DIAGNOSIS — I6350 Cerebral infarction due to unspecified occlusion or stenosis of unspecified cerebral artery: Secondary | ICD-10-CM | POA: Diagnosis not present

## 2016-05-05 DIAGNOSIS — E669 Obesity, unspecified: Secondary | ICD-10-CM

## 2016-05-05 NOTE — Patient Instructions (Signed)

## 2016-05-05 NOTE — Progress Notes (Signed)
Subjective:    [name] is a 65 y.o. male.  HPI     Interim history:   Terry Andrews is a very pleasant 65 year old right-handed gentleman with an underlying medical history of reflux disease, hypertension, smoking, heart disease and history of right pontine stroke in 2003 in the context of medical noncompliance and cocaine use (per patient, was hospitalized in 2003), with no residual weakness, as well as obesity, who presents for follow-up consultation of his OSA, on treatment with AutoPap therapy at home. The patient is unaccompanied today. I last saw him on 05/06/2015, at which time he reported doing well. He liked his new CPAP machine. He had some issues with knee pain and back pain. He was seen a chiropractor for this. His cardiologist wanted to see him in one year. He was not quite ready to quit smoking. He was fully compliant with CPAP therapy.  Today, 05/05/2016: I reviewed his autoPAP compliance data from 04/03/2016 through 05/02/2016 which is a total of 30 days, during which time he used his machine 29 days with percent used days greater than 4 hours at 97%, indicating excellent compliance with an average usage of 7 hours and 15 minutes, residual AHI low at 0.3 per hour. 95th percentile pressure at 8.6 cm, leak acceptable with the 95th percentile at 19.1 L/m, pressure setting of 5-15 cm with EPR.  Today, 05/05/2016: He reports doing okay, no problems with autoPAP. Saw Dr. Einar Gip a couple of months ago, good to go for a year.Still smokes about half a pack per day, states he will quit when he is ready. He also smokes marijuana and drinks alcohol regularly, nearly daily. Had a checkup with his primary care physician, is stable, limited medications, takes his baby aspirin. No new stroke symptoms or warning signs. Has lost a little bit of weight. Is working on weight loss.     Previously:    I saw him on 01/04/2015, at which time he reported feeling about the same. He did not  bring his machine or SD card. He had no new sleep related complaints. He had lost a few pounds. He was feeling better with CPAP and was using his old machine. I suggested we start him on AutoPap therapy and provide him with a new machine. He reported that his brother has severe OSA and uses a CPAP too.    I reviewed his CPAP compliance data from 04/01/2015 through 04/30/2015 which is a total of 30 days during which time he used his machine every night except for 1, with percent used days greater than 4 hours at 97%, indicating excellent compliance with an average usage of 6 hours and 38 minutes, residual AHI low at 0.5 per hour, leak acceptable with the 95th percentile at 21.9 L/m, borderline, and 95th percentile of pressure at 8.8 cm, setting of AutoPap pap with a range of 5-15 cm with EPR.     I first met him on 11/15/2014 at the request of his cardiologist, at which time the patient reported a prior diagnosis of obstructive sleep apnea and treatment with CPAP for the past 10 years. He had noticed problems with his machine. Prior sleep test results were not available for me. His sleep study was over 10 years ago. I suggested he return for a potential split-night sleep study. He ended up having a baseline sleep study on 11/22/2014 and I went over his test results with him in detail today. His sleep efficiency was reduced markedly at  53%. Of note the patient had difficulty maintaining sleep and particularly had a hard time going back to sleep after 2:15 AM. Sleep latency was normal but wake after sleep onset was nearly 3 hours. There was mild to moderate sleep fragmentation. He had an increased percentage of stage II sleep, and a decreased percentage of REM sleep at 9.7%. He had no significant PLMS. He had occasional PVCs on single-lead EKG. He had mild intermittent snoring. Total AHI was borderline at 4.5 per hour, rising to 32.2 per hour during REM sleep and 6.7 per hour in the supine position. He slept  mostly in the supine position. Average oxygen saturation was 90% but there were faulty readings as well on the pulse oximeter throughout the night.      He was diagnosed with obstructive sleep apnea about 10 years ago. He was placed on CPAP therapy. He has been compliant with treatment. About 2 or 3 months ago he started noticing that the machine was making a lot of noise and he actually quit using it. You encouraged him to restart using it and he also had a bad cough and bout of congestion which since then has improved but it made it hard for him to be compliant with CPAP therapy at the time. All in all he estimates that he has over the course of 10 years always been compliant with treatment about 85% of the time. He uses nasal pillows. He did not bring his machine today.   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 He is single. He has 2 grown daughters. He is the youngest of a total of 7 children. One sister died of breast cancer. Another sister has breast cancer. His brother has sleep apnea and encouraged him to get tested in the past. He still smokes and is not motivated to quit. He lost some weight with your encouragement. He does not have a set bedtime and wake time routine. He drinks caffeine in the form of coffee, tea and sodas throughout the day. He has a TV on at night throughout the night typically. He likes to sleep on his sides. He likes to take a nap in the afternoons. He is not known to have any parasomnias. He denies restless leg symptoms, morning headaches and nocturia. He wakes up typically quite well rested when he is able to use CPAP but again lately, his machine has been defective.   His DME company is [facility].    Prior sleep test results are not available for my review today. I reviewed your office note from 10/15/2014 which you kindly included. Prior test results were also reviewed including carotid Doppler studies  from 04/13/2011 which showed no significant stenosis of either internal carotid artery. Echocardiogram from 04/16/2011 showed normal EF, moderate LVH, grade 2 diastolic dysfunction. According to your office note he was diagnosed with obstructive sleep apnea on 05/21/2015 with a sleep study. Labs from 06/07/2014 showed normal TSH, A1c of 6.6, total cholesterol of 130, LDL at 71, CMP and CBC normal. EKG from 05/03/2014 showed normal sinus rhythm with borderline first-degree AV block and some PVCs. His CPAP has been nearly 63 years old and he has not had re-evaluation of his OSA and nearly 10 years.   His Past Medical History Is Significant For: Past Medical History:  Diagnosis Date  . GERD (gastroesophageal reflux disease)   . H/O: CVA (cerebrovascular accident)   .  Hypertension   . OSA (obstructive sleep apnea)     His Past Surgical History Is Significant For: No past surgical history on file.  His Family History Is Significant For: Family History  Problem Relation Age of Onset  . Cancer Mother   . Stroke Father   . Heart disease Father     His Social History Is Significant For: Social History   Social History  . Marital status: Single    Spouse name: N/A  . Number of children: 2  . Years of education: College   Social History Main Topics  . Smoking status: Current Every Day Smoker  . Smokeless tobacco: Not on file  . Alcohol use 0.0 oz/week     Comment: Occasional   . Drug use: No  . Sexual activity: Not on file   Other Topics Concern  . Not on file   Social History Narrative   Some caffeine use     His Allergies Are:  No Known Allergies:   His Current Medications Are:  Outpatient Encounter Prescriptions as of 05/05/2016  Medication Sig  . aspirin 81 MG tablet Take 81 mg by mouth daily.  . Azilsartan-Chlorthalidone (EDARBYCLOR) 40-25 MG TABS Take by mouth.  . multivitamin-iron-minerals-folic acid (CENTRUM) chewable tablet Chew 1 tablet by mouth daily.  Marland Kitchen  spironolactone (ALDACTONE) 25 MG tablet Take 25 mg by mouth daily.  . verapamil (CALAN-SR) 240 MG CR tablet    No facility-administered encounter medications on file as of 05/05/2016.   :  Review of Systems:  Out of a complete 14 point review of systems, all are reviewed and negative with the exception of these symptoms as listed below:  Review of Systems  Neurological:       Patient states that he is doing well with new CPAP machine. No new concerns.     Objective:  Neurologic Exam  Physical Exam Physical Examination:   Vitals:   05/05/16 0944  BP: 120/62  Pulse: 70  Resp: 18   General Examination: The patient is a very pleasant 65 y.o. male in no acute distress. He appears well-developed and well-nourished and well groomed. He is in good spirits today.  HEENT: Normocephalic, atraumatic, pupils are equal, round and reactive to light and accommodation. Extraocular tracking is good without limitation to gaze excursion or nystagmus noted. Normal smooth pursuit is noted. Hearing is grossly intact. Face is symmetric with normal facial animation and normal facial sensation. Speech is clear with no dysarthria noted. There is no hypophonia. There is no lip, neck/head, jaw or voice tremor. Neck is supple with full range of passive and active motion. There are no carotid bruits on auscultation. Oropharynx exam reveals: mild mouth dryness, adequate dental hygiene and marked airway crowding, due to large tongue, thick soft palate and large uvula. He has a sensitive gag and tonsils are not visualized. Mallampati is class III. Tongue protrudes centrally and palate elevates symmetrically. He has a Mild overbite.   Chest: Clear to auscultation without wheezing, rhonchi or crackles noted.  Heart: S1+S2+0, regular and normal without murmurs, rubs or gallops noted.   Abdomen: Soft, non-tender and non-distended with normal bowel sounds appreciated on auscultation.  Extremities: There is no pitting  edema in the distal lower extremities bilaterally. Pedal pulses are intact.  Skin: Warm and dry without trophic changes noted. There are no varicose veins. Signs of acne/rosacea.  Musculoskeletal: exam reveals no significant joint pain, some back pain.   Neurologically:  Mental status: The patient is awake,  alert and oriented in all 4 spheres. His immediate and remote memory, attention, language skills and fund of knowledge are appropriate. There is no evidence of aphasia, agnosia, apraxia or anomia. Speech is clear with normal prosody and enunciation. Thought process is linear. Mood is normal and affect is normal.  Cranial nerves II - XII are as described above under HEENT exam. In addition: shoulder shrug is normal with equal shoulder height noted. Motor exam: Normal bulk, strength and tone is noted. There is no drift, tremor or rebound. Romberg is negative. Reflexes are 2+ throughout. Fine motor skills and coordination: intact. There is no truncal or gait ataxia.  Sensory exam: intact to light touch.  Gait, station and balance: He stands with mild difficulty. He can stand narrow based. Gait shows normal stride length and normal pace, difficulty with tandem walk. .   Assessment and Plan:   In summary, CURTISS MAHMOOD is a very pleasant 65 year old male with an underlying medical history of reflux disease, hypertension, smoking, heart disease and history of right pontine stroke in 2003 in the context of medical noncompliance and cocaine use (per patient's own report), without evidence of residual weakness, as well as obesity, who presents for follow-up consultation of his obstructive sleep apnea, well-established on AutoPap with a new machine since 2016. He was issued to CPAP machine in the past but needed a replacement machine. He had a sleep study in April 2016. He has been compliant with treatment. His physical exam is stable. He is again counseled extensively today. In particular, he is advised to  refrain from illicit drugs, stop smoking, drink more water, stay active physically, and be cautious with alcohol use - all discussed in the past and again today. He is advised to continue using his machine regularly and he is commended for being compliant with treatment.  he is advised to follow-up with me in one year. I renewed his CPAP supply order. He is encouraged to call with any interim questions or concerns. I answered all his questions today and he was in agreement.  I spent 25 minutes in total face-to-face time with the patient, more than 50% of which was spent in counseling and coordination of care, reviewing test results, reviewing medication and discussing or reviewing the diagnosis of OSA, its prognosis and treatment options.

## 2016-07-03 DIAGNOSIS — Z720 Tobacco use: Secondary | ICD-10-CM | POA: Diagnosis not present

## 2016-07-03 DIAGNOSIS — R7303 Prediabetes: Secondary | ICD-10-CM | POA: Diagnosis not present

## 2016-07-03 DIAGNOSIS — I10 Essential (primary) hypertension: Secondary | ICD-10-CM | POA: Diagnosis not present

## 2016-07-03 DIAGNOSIS — R7309 Other abnormal glucose: Secondary | ICD-10-CM | POA: Diagnosis not present

## 2016-08-21 DIAGNOSIS — E876 Hypokalemia: Secondary | ICD-10-CM | POA: Diagnosis not present

## 2016-08-28 DIAGNOSIS — H25013 Cortical age-related cataract, bilateral: Secondary | ICD-10-CM | POA: Diagnosis not present

## 2016-08-28 DIAGNOSIS — H43812 Vitreous degeneration, left eye: Secondary | ICD-10-CM | POA: Diagnosis not present

## 2016-08-28 DIAGNOSIS — H2513 Age-related nuclear cataract, bilateral: Secondary | ICD-10-CM | POA: Diagnosis not present

## 2016-11-06 DIAGNOSIS — G4733 Obstructive sleep apnea (adult) (pediatric): Secondary | ICD-10-CM | POA: Diagnosis not present

## 2017-01-04 DIAGNOSIS — J069 Acute upper respiratory infection, unspecified: Secondary | ICD-10-CM | POA: Diagnosis not present

## 2017-01-04 DIAGNOSIS — Z1211 Encounter for screening for malignant neoplasm of colon: Secondary | ICD-10-CM | POA: Diagnosis not present

## 2017-01-04 DIAGNOSIS — Z0000 Encounter for general adult medical examination without abnormal findings: Secondary | ICD-10-CM | POA: Diagnosis not present

## 2017-01-04 DIAGNOSIS — R7309 Other abnormal glucose: Secondary | ICD-10-CM | POA: Diagnosis not present

## 2017-01-04 DIAGNOSIS — I10 Essential (primary) hypertension: Secondary | ICD-10-CM | POA: Diagnosis not present

## 2017-02-01 ENCOUNTER — Telehealth: Payer: Self-pay | Admitting: Neurology

## 2017-02-01 NOTE — Telephone Encounter (Signed)
Pt said the top of his CPAP broke about 4 days ago, [facility] said they sent a request. Pt is wanting to know if could be expedited bc he has not slept since it broke. Please call the pt

## 2017-02-01 NOTE — Telephone Encounter (Signed)
Order faxed back to [facility]. Received a receipt of confirmation.

## 2017-02-02 DIAGNOSIS — G4733 Obstructive sleep apnea (adult) (pediatric): Secondary | ICD-10-CM | POA: Diagnosis not present

## 2017-02-18 ENCOUNTER — Encounter (HOSPITAL_COMMUNITY): Payer: Self-pay | Admitting: Emergency Medicine

## 2017-02-18 ENCOUNTER — Ambulatory Visit (HOSPITAL_COMMUNITY)
Admission: EM | Admit: 2017-02-18 | Discharge: 2017-02-18 | Disposition: A | Discharge status: Home / Self Care | Payer: Medicare Other | Attending: Family Medicine | Admitting: Family Medicine

## 2017-02-18 DIAGNOSIS — W260XXA Contact with knife, initial encounter: Secondary | ICD-10-CM | POA: Diagnosis not present

## 2017-02-18 DIAGNOSIS — S61011A Laceration without foreign body of right thumb without damage to nail, initial encounter: Secondary | ICD-10-CM

## 2017-02-18 DIAGNOSIS — Z23 Encounter for immunization: Secondary | ICD-10-CM

## 2017-02-18 MED ORDER — TETANUS-DIPHTH-ACELL PERTUSSIS 5-2.5-18.5 LF-MCG/0.5 IM SUSP
INTRAMUSCULAR | Status: AC
  Filled 2017-02-18: qty 0.5

## 2017-02-18 MED ORDER — TETANUS-DIPHTH-ACELL PERTUSSIS 5-2.5-18.5 LF-MCG/0.5 IM SUSP
0.5000 mL | Freq: Once | INTRAMUSCULAR | Status: AC
  Administered 2017-02-18: 0.5 mL via INTRAMUSCULAR

## 2017-02-18 NOTE — Discharge Instructions (Signed)
Please return for suture removal in 7 days. You may use over the counter ibuprofen or acetaminophen as needed.   You received a tetanus shot today.

## 2017-02-18 NOTE — ED Provider Notes (Addendum)
  [place]   [id number] 02/18/17 Arrival Time: 1202  ASSESSMENT & PLAN:  1. Laceration of right thumb without foreign body without damage to nail, initial encounter    #3 5-0 Prolene sutures to close wound. Wound care instructions given. Td given. To return for suture removal in 7 days.  Also notice increased BP. Will f/u with PCP.  Reviewed expectations re: course of current medical issues. Questions answered. Outlined signs and symptoms indicating need for more acute intervention. Patient verbalized understanding. After Visit Summary given.   SUBJECTIVE:  Terry Andrews is a 66 y.o. male who presents with complaint of R thumb laceration approx 45 min ago. Cutting food and knife slipped. Moderate bleeding. Takes daily ASA. No ROM loss reported. No sensation changes reported. Flushed with water at home. Last Td approx 30 years go.  ROS: As per HPI.   OBJECTIVE:  Vitals:   02/18/17 1229  BP: (!) 148/104  Pulse: 85  Temp: 98.3 F (36.8 C)  TempSrc: Oral  SpO2: 95%     General appearance: alert; no distress R thumb with approx 1 cm proximal laceration. FROM. No foreign body.  Procedure: Verbal consent was obtained. Patient provided with risks and alternatives to the procedure. Wound copiously irrigated with NS then cleansed with betadine. Anesthetized with 2 mL of lidocaine without epinephrine. Wound carefully explored and no foreign body, tendon injury, or nonviable tissue were noted. Using sterile technique 3 interrupted 5-0 Prolene sutures were placed to reapproximate the wound. Patient tolerated procedure well. No complications. Minimal bleeding. Patient advised to look for and return for any signs of infection such as redness, swelling, discharge, or worsening pain. Patient advised to return for suture removal within 7 days.  No Known Allergies  PMHx, SurgHx, SocialHx, Medications, and Allergies were reviewed in the Visit Navigator and updated as  appropriate.      Vanessa Kick, MD 02/18/17 [place]    Vanessa Kick, MD 02/18/17 1255

## 2017-02-18 NOTE — ED Triage Notes (Signed)
Pt states he was cutting ribs with a knife when he sliced his thumb.  He states this happened about 45 min ago.

## 2017-02-25 ENCOUNTER — Encounter (HOSPITAL_COMMUNITY): Payer: Self-pay

## 2017-02-25 ENCOUNTER — Ambulatory Visit (HOSPITAL_COMMUNITY)
Admission: EM | Admit: 2017-02-25 | Discharge: 2017-02-25 | Disposition: A | Discharge status: Home / Self Care | Payer: Medicare Other

## 2017-02-25 DIAGNOSIS — Z4802 Encounter for removal of sutures: Secondary | ICD-10-CM | POA: Diagnosis not present

## 2017-02-25 DIAGNOSIS — S61011A Laceration without foreign body of right thumb without damage to nail, initial encounter: Secondary | ICD-10-CM | POA: Diagnosis not present

## 2017-02-25 NOTE — ED Provider Notes (Signed)
CSN: [id number]     Arrival date & time 02/25/17  1210 History   None    Chief Complaint  Patient presents with  . Suture / Staple Removal   (Consider location/radiation/quality/duration/timing/severity/associated sxs/prior Treatment) Laceration right thumb with sutures and patient is here for suture removal.   The history is provided by the patient.  Suture / Staple Removal  This is a new problem. The problem occurs constantly. The problem has not changed since onset.Nothing aggravates the symptoms.    Past Medical History:  Diagnosis Date  . GERD (gastroesophageal reflux disease)   . H/O: CVA (cerebrovascular accident)   . Hypertension   . OSA (obstructive sleep apnea)    History reviewed. No pertinent surgical history. Family History  Problem Relation Age of Onset  . Cancer Mother   . Stroke Father   . Heart disease Father    Social History  Substance Use Topics  . Smoking status: Current Every Day Smoker    Packs/day: 0.50    Types: Cigarettes  . Smokeless tobacco: Never Used  . Alcohol use 0.0 oz/week     Comment: Occasional     Review of Systems  Constitutional: Negative.   HENT: Negative.   Eyes: Negative.   Respiratory: Negative.   Cardiovascular: Negative.   Gastrointestinal: Negative.   Endocrine: Negative.   Genitourinary: Negative.   Musculoskeletal: Negative.   Skin: Positive for wound.  Allergic/Immunologic: Negative.   Neurological: Negative.   Hematological: Negative.   Psychiatric/Behavioral: Negative.     Allergies  Patient has no known allergies.  Home Medications   Prior to Admission medications   Medication Sig Start Date End Date Taking? Authorizing Provider  aspirin 81 MG tablet Take 81 mg by mouth daily.   Yes [provider]  Azilsartan-Chlorthalidone (EDARBYCLOR) 40-25 MG TABS Take by mouth.   Yes [provider]  multivitamin-iron-minerals-folic acid (CENTRUM) chewable tablet Chew 1 tablet by mouth daily.    Yes [provider]  spironolactone (ALDACTONE) 25 MG tablet Take 25 mg by mouth daily.   Yes [provider]  verapamil (CALAN-SR) 240 MG CR tablet  11/28/14  Yes [provider]   Meds Ordered and Administered this Visit  Medications - No data to display  BP 126/67 (BP Location: Left Arm)   Pulse 86   Temp 99.7 F (37.6 C) (Oral)   Resp 16   Ht 6\' 3"  (1.905 m)   Wt 271 lb (122.9 kg)   SpO2 100%   BMI 33.87 kg/m  No data found.   Physical Exam  Constitutional: He appears well-developed and well-nourished.  HENT:  Head: Normocephalic and atraumatic.  Eyes: Pupils are equal, round, and reactive to light. Conjunctivae and EOM are normal.  Neck: Normal range of motion. Neck supple.  Cardiovascular: Normal rate, regular rhythm and normal heart sounds.   Pulmonary/Chest: Effort normal and breath sounds normal.  Skin:  Laceration right thumb well approximated with sutures.  Nursing note and vitals reviewed.   Urgent Care Course     Procedures (including critical care time)  Labs Review Labs Reviewed - No data to display  Imaging Review No results found.   Visual Acuity Review  Right Eye Distance:   Left Eye Distance:   Bilateral Distance:    Right Eye Near:   Left Eye Near:    Bilateral Near:         MDM   1. Laceration of right thumb without foreign body without damage to nail, initial  encounter    3 sutures removed by Nurse      Lysbeth Penner, FNP 02/25/17 1244

## 2017-02-25 NOTE — Discharge Instructions (Addendum)
Please return for any new or worsening symptoms.

## 2017-02-25 NOTE — ED Triage Notes (Signed)
Pt. Here for suture removal from laceration 7 days ago. Pt. Had 3 sutures and 1 fell out on its own. Laceration wound edges approximated and healed. Pt. Denies pain.

## 2017-02-25 NOTE — ED Notes (Signed)
2x sutures removed by RN from left thumb.

## 2017-03-18 DIAGNOSIS — R7309 Other abnormal glucose: Secondary | ICD-10-CM | POA: Diagnosis not present

## 2017-03-18 DIAGNOSIS — I119 Hypertensive heart disease without heart failure: Secondary | ICD-10-CM | POA: Diagnosis not present

## 2017-03-18 DIAGNOSIS — I10 Essential (primary) hypertension: Secondary | ICD-10-CM | POA: Diagnosis not present

## 2017-04-15 DIAGNOSIS — R9431 Abnormal electrocardiogram [ECG] [EKG]: Secondary | ICD-10-CM | POA: Diagnosis not present

## 2017-04-15 DIAGNOSIS — I10 Essential (primary) hypertension: Secondary | ICD-10-CM | POA: Diagnosis not present

## 2017-05-04 ENCOUNTER — Telehealth: Payer: Self-pay

## 2017-05-04 NOTE — Progress Notes (Signed)
[facility]  PATIENT: Terry Andrews DOB: 06-Jul-1951   REASON FOR VISIT: Obstructive sleep apnea here for CPAPcompliance HISTORY FROM: Patient    HISTORY OF PRESENT ILLNESS:UPDATE 05/05/2017 CM Terry Andrews, 66 year old male returns for follow-up. He has obstructive sleep apnea and is using CPAP. 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  On treatment with AutoPap therapy at home. Compliance data dated 04/04/2017- 05/03/2017 shows greater than 4 hours at 87%. Average usage 7 hours 27 minutes. Pressure setting 5-15 cm of water. EPR 3. AHI 0.5. 95 percentile leak at 19.4 which is acceptable. He returns for reevaluation  SAToday, 05/05/2016: I reviewed his autoPAP compliance data from 04/03/2016 through 05/02/2016 which is a total of 30 days, during which time he used his machine 29 days with percent used days greater than 4 hours at 97%, indicating excellent compliance with an average usage of 7 hours and 15 minutes, residual AHI low at 0.3 per hour. 95th percentile pressure at 8.6 cm, leak acceptable with the 95th percentile at 19.1 L/m, pressure setting of 5-15 cm with EPR.  Today, 05/05/2016: He reports doing okay, no problems with autoPAP. Saw Dr. Einar Gip a couple of months ago, good to go for a year.Still smokes about half a pack per day, states he will quit when he is ready. He also smokes marijuana and drinks alcohol regularly, nearly daily. Had a checkup with his primary care physician, is stable, limited medications, takes his baby aspirin. No new stroke symptoms or warning signs. Has lost a little bit of weight. Is working on weight loss.    REVIEW OF SYSTEMS: Full 14 system review of systems performed and notable only for those listed, all others are neg:  Constitutional: neg  Cardiovascular:  neg Ear/Nose/Throat: neg  Skin: neg Eyes: neg Respiratory: neg Gastroitestinal: neg  Hematology/Lymphatic: neg  Endocrine: neg Musculoskeletal:neg Allergy/Immunology: neg Neurological: neg Psychiatric: neg Sleep : Obstructive sleep apnea with CPAP   ALLERGIES: No Known Allergies  HOME MEDICATIONS: Outpatient Medications Prior to Visit  Medication Sig Dispense Refill  . aspirin 81 MG tablet Take 81 mg by mouth daily.    . Azilsartan-Chlorthalidone (EDARBYCLOR) 40-25 MG TABS Take by mouth.    . multivitamin-iron-minerals-folic acid (CENTRUM) chewable tablet Chew 1 tablet by mouth daily.    Marland Kitchen spironolactone (ALDACTONE) 25 MG tablet Take 25 mg by mouth daily.    . verapamil (CALAN-SR) 240 MG CR tablet      No facility-administered medications prior to visit.     PAST MEDICAL HISTORY: Past Medical History:  Diagnosis Date  . GERD (gastroesophageal reflux disease)   . H/O: CVA (cerebrovascular accident)   . Hypertension   . OSA (obstructive sleep apnea)     PAST SURGICAL HISTORY: History reviewed. No pertinent surgical history.  FAMILY HISTORY: Family History  Problem Relation Age of Onset  . Cancer Mother   . Stroke Father   . Heart disease Father     SOCIAL HISTORY: Social History   Social History  . Marital status: Single    Spouse name: N/A  . Number of children: 2  . Years of education: College   Occupational History  . Not on file.   Social History Main Topics  . Smoking status: Current Every Day Smoker    Packs/day: 0.45    Types: Cigarettes  .  Smokeless tobacco: Never Used  . Alcohol use 0.0 oz/week     Comment: Occasional   . Drug use: Yes    Types: Marijuana     Comment: every once in a while  . Sexual activity: Not on file   Other Topics Concern  . Not on file   Social History Narrative   Some caffeine use      PHYSICAL EXAM  Vitals:   05/05/17 0836  BP: 127/76  Pulse: 86  Weight: 265 lb (120.2 kg)  Height: 5\' 11"  (1.803 m)    Body mass index is 36.96 kg/m.  Generalized: Well developed, in no acute distress  Head: normocephalic and atraumatic,. Oropharynx benign  Neck: Supple, no carotid bruits  Cardiac: Regular rate rhythm, no murmur  Musculoskeletal: No deformity   Neurological examination   Mentation: Alert oriented to time, place, history taking. Attention span and concentration appropriate. Recent and remote memory intact.  Follows all commands speech and language fluent. ESS 10. FSS 19  Cranial nerve II-XII: Pupils were equal round reactive to light extraocular movements were full, visual field were full on confrontational test. Facial sensation and strength were normal. hearing was intact to finger rubbing bilaterally. Uvula tongue midline. head turning and shoulder shrug were normal and symmetric.Tongue protrusion into cheek strength was normal. Motor: normal bulk and tone, full strength in the BUE, BLE, fine finger movements normal,  Sensory: normal and symmetric to light touch, in the upper and lower extremity Coordination: finger-nose-finger, heel-to-shin bilaterally, no dysmetria Reflexes: Symmetric upper and lower, plantar responses were flexor bilaterally. Gait and Station: Rising up from seated position without assistance, normal stance,  moderate stride, good arm swing, smooth turning, able to perform tiptoe, and heel walking without difficulty. Tandem gait is steady  DIAGNOSTIC DATA (LABS, IMAGING, TESTING) - I reviewed patient records, labs, notes, testing and imaging myself where available.     ASSESSMENT AND PLAN  66 y.o. year old male  has a past medical history of GERD (gastroesophageal reflux disease); H/O: CVA (cerebrovascular accident); Hypertension; and OSA (obstructive sleep apnea). here  to follow-up for CPAP compliance.Compliance data dated 04/04/2017- 05/03/2017 shows greater than 4 hours at 87%. Average usage 7 hours 27 minutes. Pressure setting 5-15 cm of water. EPR 3. AHI  0.5. 95 percentile leak at 19.4 which is acceptable. The patient is a current patient of Dr. Rexene Alberts  who is out of the office today . This note is sent to the work in doctor.      CPAP compliance 87% reviewed with patient  Follow-up yearly and when necessary I spent 20 min in total face to face time with the patient more than 50% of which was spent counseling and coordination of care, reviewing test results reviewing medications and discussing and reviewing the diagnosis of obstructive sleep apnea and compliance report Dennie Bible, [facility], [facility], APRN  [facility] [address] [place], [place] [address] [phone]

## 2017-05-04 NOTE — Telephone Encounter (Signed)
I called Terry Andrews. I advised him that Dr. Rexene Alberts is out of the office sick tomorrow and his appt will need to be rescheduled. Terry Andrews is agreeable to seeing [name], NP tomorrow for his cpap follow up. Terry Andrews verbalized understanding of his new appt date and time and to arrive at 8:30am.

## 2017-05-05 ENCOUNTER — Encounter: Payer: Self-pay | Admitting: Nurse Practitioner

## 2017-05-05 ENCOUNTER — Ambulatory Visit (INDEPENDENT_AMBULATORY_CARE_PROVIDER_SITE_OTHER): Payer: Medicare Other | Admitting: Nurse Practitioner

## 2017-05-05 ENCOUNTER — Ambulatory Visit: Payer: Medicare Other | Admitting: Neurology

## 2017-05-05 DIAGNOSIS — G4733 Obstructive sleep apnea (adult) (pediatric): Secondary | ICD-10-CM | POA: Diagnosis not present

## 2017-05-05 DIAGNOSIS — Z9989 Dependence on other enabling machines and devices: Secondary | ICD-10-CM | POA: Diagnosis not present

## 2017-05-05 NOTE — Patient Instructions (Signed)
CPAP compliance 87% Follow-up yearly and when necessary

## 2017-05-18 NOTE — Progress Notes (Signed)
I reviewed note and agree with plan.   Kaesha Kirsch R. Xaidyn Kepner, MD  Certified in Neurology, Neurophysiology and Neuroimaging  [facility] [address], [address] [place],  [address] [phone]   

## 2017-05-26 DIAGNOSIS — R7309 Other abnormal glucose: Secondary | ICD-10-CM | POA: Diagnosis not present

## 2017-05-26 DIAGNOSIS — E876 Hypokalemia: Secondary | ICD-10-CM | POA: Diagnosis not present

## 2017-05-26 DIAGNOSIS — R21 Rash and other nonspecific skin eruption: Secondary | ICD-10-CM | POA: Diagnosis not present

## 2017-06-09 DIAGNOSIS — Z720 Tobacco use: Secondary | ICD-10-CM | POA: Diagnosis not present

## 2017-06-09 DIAGNOSIS — R21 Rash and other nonspecific skin eruption: Secondary | ICD-10-CM | POA: Diagnosis not present

## 2017-07-07 DIAGNOSIS — I10 Essential (primary) hypertension: Secondary | ICD-10-CM | POA: Diagnosis not present

## 2017-10-06 DIAGNOSIS — I10 Essential (primary) hypertension: Secondary | ICD-10-CM | POA: Diagnosis not present

## 2017-10-06 DIAGNOSIS — K219 Gastro-esophageal reflux disease without esophagitis: Secondary | ICD-10-CM | POA: Diagnosis not present

## 2017-10-06 DIAGNOSIS — Z86010 Personal history of colonic polyps: Secondary | ICD-10-CM | POA: Diagnosis not present

## 2017-10-28 DIAGNOSIS — L299 Pruritus, unspecified: Secondary | ICD-10-CM | POA: Diagnosis not present

## 2017-10-28 DIAGNOSIS — L309 Dermatitis, unspecified: Secondary | ICD-10-CM | POA: Diagnosis not present

## 2017-10-28 DIAGNOSIS — L308 Other specified dermatitis: Secondary | ICD-10-CM | POA: Diagnosis not present

## 2017-11-02 DIAGNOSIS — K635 Polyp of colon: Secondary | ICD-10-CM | POA: Diagnosis not present

## 2017-11-02 DIAGNOSIS — Z86010 Personal history of colonic polyps: Secondary | ICD-10-CM | POA: Diagnosis not present

## 2017-11-02 DIAGNOSIS — K5730 Diverticulosis of large intestine without perforation or abscess without bleeding: Secondary | ICD-10-CM | POA: Diagnosis not present

## 2017-11-02 DIAGNOSIS — D120 Benign neoplasm of cecum: Secondary | ICD-10-CM | POA: Diagnosis not present

## 2017-11-12 DIAGNOSIS — L309 Dermatitis, unspecified: Secondary | ICD-10-CM | POA: Diagnosis not present

## 2017-11-12 DIAGNOSIS — G4733 Obstructive sleep apnea (adult) (pediatric): Secondary | ICD-10-CM | POA: Diagnosis not present

## 2017-11-12 DIAGNOSIS — L299 Pruritus, unspecified: Secondary | ICD-10-CM | POA: Diagnosis not present

## 2017-12-10 DIAGNOSIS — L299 Pruritus, unspecified: Secondary | ICD-10-CM | POA: Diagnosis not present

## 2017-12-10 DIAGNOSIS — L309 Dermatitis, unspecified: Secondary | ICD-10-CM | POA: Diagnosis not present

## 2018-05-05 ENCOUNTER — Encounter: Payer: Self-pay | Admitting: Neurology

## 2018-05-05 ENCOUNTER — Ambulatory Visit: Payer: Medicare Other | Admitting: Neurology

## 2018-05-05 VITALS — BP 123/71 | HR 74 | Ht 75.0 in | Wt 257.0 lb

## 2018-05-05 DIAGNOSIS — G4733 Obstructive sleep apnea (adult) (pediatric): Secondary | ICD-10-CM | POA: Diagnosis not present

## 2018-05-05 DIAGNOSIS — Z9989 Dependence on other enabling machines and devices: Secondary | ICD-10-CM

## 2018-05-05 NOTE — Progress Notes (Signed)
Subjective:    [name] is a 67 y.o. male.  HPI     Interim history:   Terry Andrews is a very pleasant 67 year old right-handed gentleman with an underlying medical history of reflux disease, hypertension, smoking, heart disease, remote Hx of right pontine stroke in 2003, remote history of substance abuse (2003), and obesity, who presents for follow-up consultation of his OSA, on treatment with AutoPap therapy at home. The patient is unaccompanied today. I last saw Terry Andrews on 05/05/2016, at which time he was compliant with his AutoPap. He was advised to be fully compliant with AutoPap, and encouraged to quit smoking cigarettes and Marijuana, and limit his alcohol intake.  He saw Cecille Rubin, nurse practitioner in the interim on 05/05/2017, at which time he was compliant with his AutoPap and advised to follow-up in one year routinely.  Today, 05/05/2018: I reviewed his AutoPap compliance data from 04/03/2018 through 05/02/2018 which is a total of 30 days, during which time he used his AutoPap 28 days with percent used days greater than 4 hours at 90%, indicating excellent compliance with an average usage of 8 hours and 8 minutes, residual AHI at goal at 0.4 per hour, leak high with the 95th percentile at 28.1 L/m, 95th percentile of pressure at 7.4 cm with a range of 5 cm to 15 cm with EPR. He reports doing well with his CPAP. Had a yearly check up with cardiologist. Is trying to lose weight and has lost a little bit of weight. Has had some recent stressor, fianc had a hemorrhagic stroke and is currently in inpatient rehabilitation at [facility]. Thankfully, she is doing better.  Previously:    I saw Terry Andrews on 05/06/2015, at which time he reported doing well. He liked his new CPAP machine. He had some issues with knee pain and back pain. He was seen a chiropractor for this. His cardiologist wanted to see Terry Andrews in one year. He was not quite ready to quit smoking. He was fully compliant  with CPAP therapy.   05/05/2016: I reviewed his autoPAP compliance data from 04/03/2016 through 05/02/2016 which is a total of 30 days, during which time he used his machine 29 days with percent used days greater than 4 hours at 97%, indicating excellent compliance with an average usage of 7 hours and 15 minutes, residual AHI low at 0.3 per hour. 95th percentile pressure at 8.6 cm, leak acceptable with the 95th percentile at 19.1 L/m, pressure setting of 5-15 cm with EPR.       I saw Terry Andrews on 01/04/2015, at which time he reported feeling about the same. He did not bring his machine or SD card. He had no new sleep related complaints. He had lost a few pounds. He was feeling better with CPAP and was using his old machine. I suggested we start Terry Andrews on AutoPap therapy and provide Terry Andrews with a new machine. He reported that his brother has severe OSA and uses a CPAP too.    I reviewed his CPAP compliance data from 04/01/2015 through 04/30/2015 which is a total of 30 days during which time he used his machine every night except for 1, with percent used days greater than 4 hours at 97%, indicating excellent compliance with an average usage of 6 hours and 38 minutes, residual AHI low at 0.5 per hour, leak acceptable with the 95th percentile at 21.9 L/m, borderline, and 95th percentile of pressure at 8.8 cm, setting of AutoPap pap with a range  of 5-15 cm with EPR.     I first met Terry Andrews on 11/15/2014 at the request of his cardiologist, at which time the patient reported a prior diagnosis of obstructive sleep apnea and treatment with CPAP for the past 10 years. He had noticed problems with his machine. Prior sleep test results were not available for me. His sleep study was over 10 years ago. I suggested he return for a potential split-night sleep study. He ended up having a baseline sleep study on 11/22/2014 and I went over his test results with Terry Andrews in detail today. His sleep efficiency was reduced markedly at 53%. Of note  the patient had difficulty maintaining sleep and particularly had a hard time going back to sleep after 2:15 AM. Sleep latency was normal but wake after sleep onset was nearly 3 hours. There was mild to moderate sleep fragmentation. He had an increased percentage of stage II sleep, and a decreased percentage of REM sleep at 9.7%. He had no significant PLMS. He had occasional PVCs on single-lead EKG. He had mild intermittent snoring. Total AHI was borderline at 4.5 per hour, rising to 32.2 per hour during REM sleep and 6.7 per hour in the supine position. He slept mostly in the supine position. Average oxygen saturation was 90% but there were faulty readings as well on the pulse oximeter throughout the night.      He was diagnosed with obstructive sleep apnea about 10 years ago. He was placed on CPAP therapy. He has been compliant with treatment. About 2 or 3 months ago he started noticing that the machine was making a lot of noise and he actually quit using it. You encouraged Terry Andrews to restart using it and he also had a bad cough and bout of congestion which since then has improved but it made it hard for Terry Andrews to be compliant with CPAP therapy at the time. All in all he estimates that he has over the course of 10 years always been compliant with treatment about 85% of the time. He uses nasal pillows. He did not bring his machine today.   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 He is single. He has 2 grown daughters. He is the youngest of a total of 7 children. One sister died of breast cancer. Another sister has breast cancer. His brother has sleep apnea and encouraged Terry Andrews to get tested in the past. He still smokes and is not motivated to quit. He lost some weight with your encouragement. He does not have a set bedtime and wake time routine. He drinks caffeine in the form of coffee, tea and sodas throughout the day. He has a TV on at night throughout the  night typically. He likes to sleep on his sides. He likes to take a nap in the afternoons. He is not known to have any parasomnias. He denies restless leg symptoms, morning headaches and nocturia. He wakes up typically quite well rested when he is able to use CPAP but again lately, his machine has been defective.   His DME company is [facility].    Prior sleep test results are not available for my review today. I reviewed your office note from 10/15/2014 which you kindly included. Prior test results were also reviewed including carotid Doppler studies from 04/13/2011 which showed no significant stenosis of either internal carotid artery. Echocardiogram from 04/16/2011 showed normal EF, moderate LVH, grade 2  diastolic dysfunction. According to your office note he was diagnosed with obstructive sleep apnea on 05/21/2015 with a sleep study. Labs from 06/07/2014 showed normal TSH, A1c of 6.6, total cholesterol of 130, LDL at 71, CMP and CBC normal. EKG from 05/03/2014 showed normal sinus rhythm with borderline first-degree AV block and some PVCs. His CPAP has been nearly 65 years old and he has not had re-evaluation of his OSA and nearly 10 years.   His Past Medical History Is Significant For: Past Medical History:  Diagnosis Date  . GERD (gastroesophageal reflux disease)   . H/O: CVA (cerebrovascular accident)   . Hypertension   . OSA (obstructive sleep apnea)     His Past Surgical History Is Significant For: No past surgical history on file.  His Family History Is Significant For: Family History  Problem Relation Age of Onset  . Cancer Mother   . Stroke Father   . Heart disease Father     His Social History Is Significant For: Social History   Socioeconomic History  . Marital status: Single    Spouse name: Not on file  . Number of children: 2  . Years of education: College  . Highest education level: Not on file  Occupational History  . Not on file  Social Needs  .  Financial resource strain: Not on file  . Food insecurity:    Worry: Not on file    Inability: Not on file  . Transportation needs:    Medical: Not on file    Non-medical: Not on file  Tobacco Use  . Smoking status: Current Every Day Smoker    Packs/day: 0.45    Types: Cigarettes  . Smokeless tobacco: Never Used  Substance and Sexual Activity  . Alcohol use: Yes    Alcohol/week: 0.0 standard drinks    Comment: Occasional   . Drug use: Yes    Types: Marijuana    Comment: every once in a while  . Sexual activity: Not on file  Lifestyle  . Physical activity:    Days per week: Not on file    Minutes per session: Not on file  . Stress: Not on file  Relationships  . Social connections:    Talks on phone: Not on file    Gets together: Not on file    Attends religious service: Not on file    Active member of club or organization: Not on file    Attends meetings of clubs or organizations: Not on file    Relationship status: Not on file  Other Topics Concern  . Not on file  Social History Narrative   Some caffeine use     His Allergies Are:  No Known Allergies:   His Current Medications Are:  Outpatient Encounter Medications as of 05/05/2018  Medication Sig  . aspirin 81 MG tablet Take 81 mg by mouth daily.  . Azilsartan-Chlorthalidone (EDARBYCLOR) 40-25 MG TABS Take by mouth.  . multivitamin-iron-minerals-folic acid (CENTRUM) chewable tablet Chew 1 tablet by mouth daily.  Marland Kitchen spironolactone (ALDACTONE) 25 MG tablet Take 25 mg by mouth daily.  . verapamil (CALAN-SR) 240 MG CR tablet    No facility-administered encounter medications on file as of 05/05/2018.   :  Review of Systems:  Out of a complete 14 point review of systems, all are reviewed and negative with the exception of these symptoms as listed below:  Review of Systems  Neurological:       Pt presents today to discuss his cpap.  Pt reports that it is going well.    Objective:  Neurological Exam  Physical  Exam Physical Examination:   Vitals:   05/05/18 0912  BP: 123/71  Pulse: 74   General Examination: The patient is a very pleasant 67 y.o. male in no acute distress. He appears well-developed and well-nourished and well groomed.   HEENT: Normocephalic, atraumatic, pupils are equal, round and reactive to light and accommodation. Extraocular tracking is good without limitation to gaze excursion or nystagmus noted. Normal smooth pursuit is noted. Hearing is grossly intact. Face is symmetric with normal facial animation and normal facial sensation. Speech is clear with no dysarthria noted. There is no hypophonia. There is no lip, neck/head, jaw or voice tremor. Neck with FROM. Oropharynx exam reveals: mild mouth dryness, adequate dental hygiene and marked airway crowding. Tongue protrudes centrally and palate elevates symmetrically.   Chest: Clear to auscultation without wheezing, rhonchi or crackles noted.  Heart: S1+S2+0, regular and normal without murmurs, rubs or gallops noted.   Abdomen: Soft, non-tender and non-distended with normal bowel sounds appreciated on auscultation.  Extremities: There is no pitting edema in the distal lower extremities bilaterally.   Skin: Warm and dry without trophic changes noted. There are no varicose veins. Signs of acne/rosacea.  Musculoskeletal: exam reveals no significant joint pain.  Neurologically:  Mental status: The patient is awake, alert and oriented in all 4 spheres. His immediate and remote memory, attention, language skills and fund of knowledge are appropriate. There is no evidence of aphasia, agnosia, apraxia or anomia. Speech is clear with normal prosody and enunciation. Thought process is linear. Mood is normal and affect is normal.  Cranial nerves II - XII are as described above under HEENT exam.  Motor exam: Normal bulk, strength and tone is noted. There is no tremor or rebound, Romberg is negative. Fine motor skills and coordination:  intact. There is no truncal or gait ataxia.  Sensory exam: intact to light touch.  Gait, station and balance: He stands with mild difficulty. He can stand narrow based. Gait shows normal stride length and normal pace, difficulty with tandem walk.   Assessment and Plan:   In summary, DONTRAE MORINI is a very pleasant 67 year old male with an underlying medical history of reflux disease, hypertension, smoking, heart disease, remote Hx of right pontine stroke in 2003, remote history of substance abuse (2003), and obesity, who presents for follow-up consultation of his obstructive sleep apnea, well-established on AutoPap (new machine since 2016). He was issued to CPAP machine in the past but needed a replacement machine. He had a sleep study in April 2016. He has been compliant with treatment. His physical exam is stable. He is commended for his treatment adherence. He is advised to continue to strive for weight loss, stable hydrated and well rested. He is advised to follow-up routinely in one year, I renewed his prescription for CPAP supplies. I answered all his questions today and he was in agreement. I spent 20 minutes in total face-to-face time with the patient, more than 50% of which was spent in counseling and coordination of care, reviewing test results, reviewing medication and discussing or reviewing the diagnosis of OSA, its prognosis and treatment options. Pertinent laboratory and imaging test results that were available during this visit with the patient were reviewed by me and considered in my medical decision making (see chart for details).

## 2018-05-05 NOTE — Patient Instructions (Addendum)
Please continue using your CPAP regularly. While your insurance requires that you use CPAP at least 4 hours each night on 70% of the nights, I recommend, that you not skip any nights and use it throughout the night if you can. Getting used to CPAP and staying with the treatment long term does take time and patience and discipline. Untreated obstructive sleep apnea when it is moderate to severe can have an adverse impact on cardiovascular health and raise her risk for heart disease, arrhythmias, hypertension, congestive heart failure, stroke and diabetes. Untreated obstructive sleep apnea causes sleep disruption, nonrestorative sleep, and sleep deprivation. This can have an impact on your day to day functioning and cause daytime sleepiness and impairment of cognitive function, memory loss, mood disturbance, and problems focussing. Using CPAP regularly can improve these symptoms.  Keep up the good work! I will see you back in one year! 

## 2018-08-09 DIAGNOSIS — L0101 Non-bullous impetigo: Secondary | ICD-10-CM | POA: Diagnosis not present

## 2018-08-09 DIAGNOSIS — L0109 Other impetigo: Secondary | ICD-10-CM | POA: Diagnosis not present

## 2018-08-09 DIAGNOSIS — L308 Other specified dermatitis: Secondary | ICD-10-CM | POA: Diagnosis not present

## 2018-08-09 DIAGNOSIS — L0889 Other specified local infections of the skin and subcutaneous tissue: Secondary | ICD-10-CM | POA: Diagnosis not present

## 2018-08-11 ENCOUNTER — Encounter: Payer: Self-pay | Admitting: Internal Medicine

## 2018-08-11 ENCOUNTER — Ambulatory Visit (INDEPENDENT_AMBULATORY_CARE_PROVIDER_SITE_OTHER): Payer: Medicare Other | Admitting: Internal Medicine

## 2018-08-11 VITALS — BP 112/68 | HR 93 | Temp 98.7°F | Ht 75.0 in | Wt 259.8 lb

## 2018-08-11 DIAGNOSIS — I110 Hypertensive heart disease with heart failure: Secondary | ICD-10-CM

## 2018-08-11 DIAGNOSIS — Z9989 Dependence on other enabling machines and devices: Secondary | ICD-10-CM

## 2018-08-11 DIAGNOSIS — F17210 Nicotine dependence, cigarettes, uncomplicated: Secondary | ICD-10-CM

## 2018-08-11 DIAGNOSIS — G4733 Obstructive sleep apnea (adult) (pediatric): Secondary | ICD-10-CM | POA: Diagnosis not present

## 2018-08-11 DIAGNOSIS — Z6832 Body mass index (BMI) 32.0-32.9, adult: Secondary | ICD-10-CM

## 2018-08-11 DIAGNOSIS — E6609 Other obesity due to excess calories: Secondary | ICD-10-CM

## 2018-08-11 DIAGNOSIS — R7309 Other abnormal glucose: Secondary | ICD-10-CM | POA: Diagnosis not present

## 2018-08-11 DIAGNOSIS — I509 Heart failure, unspecified: Secondary | ICD-10-CM

## 2018-08-11 NOTE — Patient Instructions (Signed)
Prediabetes Eating Plan  Prediabetes is a condition that causes blood sugar (glucose) levels to be higher than normal. This increases the risk for developing diabetes. In order to prevent diabetes from developing, your health care provider may recommend a diet and other lifestyle changes to help you:  · Control your blood glucose levels.  · Improve your cholesterol levels.  · Manage your blood pressure.  Your health care provider may recommend working with a diet and nutrition specialist (dietitian) to make a meal plan that is best for you.  What are tips for following this plan?  Lifestyle  · Set weight loss goals with the help of your health care team. It is recommended that most people with prediabetes lose 7% of their current body weight.  · Exercise for at least 30 minutes at least 5 days a week.  · Attend a support group or seek ongoing support from a mental health counselor.  · Take over-the-counter and prescription medicines only as told by your health care provider.  Reading food labels  · Read food labels to check the amount of fat, salt (sodium), and sugar in prepackaged foods. Avoid foods that have:  ? Saturated fats.  ? Trans fats.  ? Added sugars.  · Avoid foods that have more than 300 milligrams (mg) of sodium per serving. Limit your daily sodium intake to less than 2,300 mg each day.  Shopping  · Avoid buying pre-made and processed foods.  Cooking  · Cook with olive oil. Do not use butter, lard, or ghee.  · Bake, broil, grill, or boil foods. Avoid frying.  Meal planning    · Work with your dietitian to develop an eating plan that is right for you. This may include:  ? Tracking how many calories you take in. Use a food diary, notebook, or mobile application to track what you eat at each meal.  ? Using the glycemic index (GI) to plan your meals. The index tells you how quickly a food will raise your blood glucose. Choose low-GI foods. These foods take a longer time to raise blood glucose.  · Consider  following a Mediterranean diet. This diet includes:  ? Several servings each day of fresh fruits and vegetables.  ? Eating fish at least twice a week.  ? Several servings each day of whole grains, beans, nuts, and seeds.  ? Using olive oil instead of other fats.  ? Moderate alcohol consumption.  ? Eating small amounts of red meat and whole-fat dairy.  · If you have high blood pressure, you may need to limit your sodium intake or follow a diet such as the DASH eating plan. DASH is an eating plan that aims to lower high blood pressure.  What foods are recommended?  The items listed below may not be a complete list. Talk with your dietitian about what dietary choices are best for you.  Grains  Whole grains, such as whole-wheat or whole-grain breads, crackers, cereals, and pasta. Unsweetened oatmeal. Bulgur. Barley. Quinoa. Brown rice. Corn or whole-wheat flour tortillas or taco shells.  Vegetables  Lettuce. Spinach. Peas. Beets. Cauliflower. Cabbage. Broccoli. Carrots. Tomatoes. Squash. Eggplant. Herbs. Peppers. Onions. Cucumbers. Brussels sprouts.  Fruits  Berries. Bananas. Apples. Oranges. Grapes. Papaya. Mango. Pomegranate. Kiwi. Grapefruit. Cherries.  Meats and other protein foods  Seafood. Poultry without skin. Lean cuts of pork and beef. Tofu. Eggs. Nuts. Beans.  Dairy  Low-fat or fat-free dairy products, such as yogurt, cottage cheese, and cheese.  Beverages  Water.   Tea. Coffee. Sugar-free or diet soda. Seltzer water. Lowfat or no-fat milk. Milk alternatives, such as soy or almond milk.  Fats and oils  Olive oil. Canola oil. Sunflower oil. Grapeseed oil. Avocado. Walnuts.  Sweets and desserts  Sugar-free or low-fat pudding. Sugar-free or low-fat ice cream and other frozen treats.  Seasoning and other foods  Herbs. Sodium-free spices. Mustard. Relish. Low-fat, low-sugar ketchup. Low-fat, low-sugar barbecue sauce. Low-fat or fat-free mayonnaise.  What foods are not recommended?  The items listed below may not be a  complete list. Talk with your dietitian about what dietary choices are best for you.  Grains  Refined white flour and flour products, such as bread, pasta, snack foods, and cereals.  Vegetables  Canned vegetables. Frozen vegetables with butter or cream sauce.  Fruits  Fruits canned with syrup.  Meats and other protein foods  Fatty cuts of meat. Poultry with skin. Breaded or fried meat. Processed meats.  Dairy  Full-fat yogurt, cheese, or milk.  Beverages  Sweetened drinks, such as sweet iced tea and soda.  Fats and oils  Butter. Lard. Ghee.  Sweets and desserts  Baked goods, such as cake, cupcakes, pastries, cookies, and cheesecake.  Seasoning and other foods  Spice mixes with added salt. Ketchup. Barbecue sauce. Mayonnaise.  Summary  · To prevent diabetes from developing, you may need to make diet and other lifestyle changes to help control blood sugar, improve cholesterol levels, and manage your blood pressure.  · Set weight loss goals with the help of your health care team. It is recommended that most people with prediabetes lose 7 percent of their current body weight.  · Consider following a Mediterranean diet that includes plenty of fresh fruits and vegetables, whole grains, beans, nuts, seeds, fish, lean meat, low-fat dairy, and healthy oils.  This information is not intended to replace advice given to you by your health care provider. Make sure you discuss any questions you have with your health care provider.  Document Released: 12/04/2014 Document Revised: 09/23/2016 Document Reviewed: 09/23/2016  Elsevier Interactive Patient Education © 2019 Elsevier Inc.

## 2018-08-12 LAB — CMP14+EGFR
ALT: 14 IU/L (ref 0–44)
AST: 14 IU/L (ref 0–40)
Albumin/Globulin Ratio: 1 — ABNORMAL LOW (ref 1.2–2.2)
Albumin: 3.4 g/dL — ABNORMAL LOW (ref 3.6–4.8)
Alkaline Phosphatase: 53 IU/L (ref 39–117)
BUN/Creatinine Ratio: 20 (ref 10–24)
BUN: 24 mg/dL (ref 8–27)
Bilirubin Total: <0.2 mg/dL (ref 0.0–1.2)
CO2: 25 mmol/L (ref 20–29)
Calcium: 9.6 mg/dL (ref 8.6–10.2)
Chloride: 103 mmol/L (ref 96–106)
Creatinine, Ser: 1.21 mg/dL (ref 0.76–1.27)
GFR calc Af Amer: 72 mL/min/{1.73_m2} (ref >59)
GFR calc non Af Amer: 62 mL/min/{1.73_m2} (ref >59)
Globulin, Total: 3.3 g/dL (ref 1.5–4.5)
Glucose: 101 mg/dL — ABNORMAL HIGH (ref 65–99)
Potassium: 3.2 mmol/L — ABNORMAL LOW (ref 3.5–5.2)
Sodium: 145 mmol/L — ABNORMAL HIGH (ref 134–144)
Total Protein: 6.7 g/dL (ref 6.0–8.5)

## 2018-08-12 LAB — LIPID PANEL
Chol/HDL Ratio: 2.3 ratio (ref 0.0–5.0)
Cholesterol, Total: 129 mg/dL (ref 100–199)
HDL: 57 mg/dL (ref >39)
LDL Calculated: 62 mg/dL (ref 0–99)
Triglycerides: 48 mg/dL (ref 0–149)
VLDL Cholesterol Cal: 10 mg/dL (ref 5–40)

## 2018-08-12 LAB — HEMOGLOBIN A1C
Est. average glucose Bld gHb Est-mCnc: 117 mg/dL
Hgb A1c MFr Bld: 5.7 % — ABNORMAL HIGH (ref 4.8–5.6)

## 2018-08-12 LAB — HEPATITIS C ANTIBODY: Hep C Virus Ab: <0.1 s/co ratio (ref 0.0–0.9)

## 2018-08-22 NOTE — Progress Notes (Signed)
Subjective:     [name] , male    DOB: 01-14-1951 , 68 y.o.   MRN: [id number]   Chief Complaint  Patient presents with  . Hypertension    HPI  Hypertension  This is a chronic problem. The current episode started more than 1 year ago. The problem has been gradually improving since onset. The problem is controlled. Pertinent negatives include no blurred vision, chest pain, palpitations or shortness of breath. Risk factors for coronary artery disease include smoking/tobacco exposure, obesity, male gender and dyslipidemia.     Past Medical History:  Diagnosis Date  . GERD (gastroesophageal reflux disease)   . H/O: CVA (cerebrovascular accident)   . Hypertension   . OSA (obstructive sleep apnea)      Family History  Problem Relation Age of Onset  . Cancer Mother   . Stroke Father   . Heart disease Father      Current Outpatient Medications:  .  aspirin 81 MG tablet, Take 81 mg by mouth daily., Disp: , Rfl:  .  Azilsartan-Chlorthalidone (EDARBYCLOR) 40-25 MG TABS, Take by mouth., Disp: , Rfl:  .  spironolactone (ALDACTONE) 25 MG tablet, Take 25 mg by mouth daily., Disp: , Rfl:  .  verapamil (CALAN-SR) 240 MG CR tablet, , Disp: , Rfl:  .  multivitamin-iron-minerals-folic acid (CENTRUM) chewable tablet, Chew 1 tablet by mouth daily., Disp: , Rfl:    No Known Allergies   Review of Systems  Constitutional: Negative.   Eyes: Negative for blurred vision.  Respiratory: Negative.  Negative for shortness of breath.   Cardiovascular: Negative.  Negative for chest pain and palpitations.  Gastrointestinal: Negative.   Neurological: Negative.   Psychiatric/Behavioral: Negative.      Today's Vitals   08/11/18 0856  BP: 112/68  Pulse: 93  Temp: 98.7 F (37.1 C)  TempSrc: Oral  Weight: 259 lb 12.8 oz (117.8 kg)  Height: '6\' 3"'  (1.905 m)   Body mass index is 32.47 kg/m.   Objective:  Physical Exam Vitals signs and nursing note reviewed.  Constitutional:       Appearance: Normal appearance. He is obese.  HENT:     Head: Normocephalic and atraumatic.  Cardiovascular:     Rate and Rhythm: Normal rate and regular rhythm.     Heart sounds: Normal heart sounds.  Pulmonary:     Effort: Pulmonary effort is normal.     Breath sounds: Normal breath sounds.  Skin:    General: Skin is warm.  Neurological:     General: No focal deficit present.     Mental Status: He is alert.         Assessment And Plan:     1. Hypertensive heart failure ([place])  Well controlled. She will continue with current meds. She is encouraged to avoid adding salt to her foods.   - Hepatitis C antibody - CMP14+EGFR - Lipid Profile  2. Obstructive sleep apnea treated with continuous positive airway pressure (CPAP)  Chronic. He is encouraged to wear CPAP at least four hours per night. Importance of compliance was discussed with the patient.   3. Other abnormal glucose  HIS A1C HAS BEEN ELEVATED IN THE PAST. I WILL CHECK AN A1C, BMET TODAY. HE IS ENCOURAGED TO AVOID SUGARY BEVERAGES AND PROCESSED FOODS INCLUDNG BREADS, RICE AND PASTA.  - Hemoglobin A1c  4. Cigarette nicotine dependence without complication  Importance of smoking cessation was discussed with the patient for greater than 5 minutes.  He is  now smoking 5 cigs per day. He has been smoking for greater than 40 years.   5. Class 1 obesity due to excess calories with serious comorbidity and body mass index (BMI) of 32.0 to 32.9 in adult  He is encouraged to strive for BMI less than 29 to decrease cardiac risk. He is advised to incorporate more exercise into his daily routine. He is also encouraged to avoid sugary beverages including sodas, juices and sweetened teas.   [name], MD

## 2018-09-13 DIAGNOSIS — L720 Epidermal cyst: Secondary | ICD-10-CM | POA: Diagnosis not present

## 2018-09-13 DIAGNOSIS — L810 Postinflammatory hyperpigmentation: Secondary | ICD-10-CM | POA: Diagnosis not present

## 2018-09-13 DIAGNOSIS — L2089 Other atopic dermatitis: Secondary | ICD-10-CM | POA: Diagnosis not present

## 2018-10-25 ENCOUNTER — Other Ambulatory Visit: Payer: Self-pay

## 2018-10-25 NOTE — Patient Outreach (Signed)
[facility]) Care Management  10/25/2018  REAL CONA Jul 19, 1951 [id number]   Medication Adherence call to [name] left a message for patient to call back patient is due on Edarbyclor 40/25 mg. [place] said patient last pick up was on 09/24/18 for a 30 days supply. Terry Andrews is showing past due under [place].    [place] Management Direct Dial [phone]  Fax [phone] [email or web address]

## 2018-11-07 DIAGNOSIS — G4733 Obstructive sleep apnea (adult) (pediatric): Secondary | ICD-10-CM | POA: Diagnosis not present

## 2018-11-14 ENCOUNTER — Other Ambulatory Visit: Payer: Self-pay | Admitting: Cardiology

## 2018-12-13 ENCOUNTER — Encounter: Payer: Self-pay | Admitting: Internal Medicine

## 2018-12-13 ENCOUNTER — Other Ambulatory Visit: Payer: Self-pay

## 2018-12-13 ENCOUNTER — Ambulatory Visit (INDEPENDENT_AMBULATORY_CARE_PROVIDER_SITE_OTHER): Payer: Medicare Other | Admitting: Internal Medicine

## 2018-12-13 VITALS — BP 112/70 | HR 68 | Temp 98.5°F | Ht 75.0 in | Wt 263.8 lb

## 2018-12-13 DIAGNOSIS — R7309 Other abnormal glucose: Secondary | ICD-10-CM | POA: Diagnosis not present

## 2018-12-13 DIAGNOSIS — Z9989 Dependence on other enabling machines and devices: Secondary | ICD-10-CM

## 2018-12-13 DIAGNOSIS — E6609 Other obesity due to excess calories: Secondary | ICD-10-CM

## 2018-12-13 DIAGNOSIS — I10 Essential (primary) hypertension: Secondary | ICD-10-CM | POA: Diagnosis not present

## 2018-12-13 DIAGNOSIS — G4733 Obstructive sleep apnea (adult) (pediatric): Secondary | ICD-10-CM

## 2018-12-13 DIAGNOSIS — Z6832 Body mass index (BMI) 32.0-32.9, adult: Secondary | ICD-10-CM

## 2018-12-13 DIAGNOSIS — F17210 Nicotine dependence, cigarettes, uncomplicated: Secondary | ICD-10-CM | POA: Diagnosis not present

## 2018-12-13 NOTE — Progress Notes (Signed)
Subjective:     [name] , male    DOB: 1950/08/28 , 68 y.o.   MRN: [id number]   Chief Complaint  Patient presents with  . Hypertension    HPI  Hypertension  This is a chronic problem. The current episode started more than 1 year ago. The problem has been gradually improving since onset. The problem is controlled. Pertinent negatives include no blurred vision, chest pain, palpitations or shortness of breath. Risk factors for coronary artery disease include smoking/tobacco exposure, obesity, male gender and dyslipidemia. Compliance problems include exercise.  Identifiable causes of hypertension include sleep apnea.     Past Medical History:  Diagnosis Date  . GERD (gastroesophageal reflux disease)   . H/O: CVA (cerebrovascular accident)   . Hypertension   . OSA (obstructive sleep apnea)      Family History  Problem Relation Age of Onset  . Cancer Mother   . Stroke Father   . Heart disease Father      Current Outpatient Medications:  .  aspirin 81 MG tablet, Take 81 mg by mouth daily., Disp: , Rfl:  .  EDARBYCLOR 40-25 MG TABS, TAKE (1) TABLET DAILY IN THE MORNING., Disp: 90 tablet, Rfl: 1 .  multivitamin-iron-minerals-folic acid (CENTRUM) chewable tablet, Chew 1 tablet by mouth daily., Disp: , Rfl:  .  spironolactone (ALDACTONE) 25 MG tablet, Take 25 mg by mouth daily., Disp: , Rfl:  .  verapamil (CALAN-SR) 240 MG CR tablet, 2 (two) times daily. , Disp: , Rfl:    No Known Allergies   Review of Systems  Constitutional: Negative.   Eyes: Negative for blurred vision.  Respiratory: Negative.  Negative for shortness of breath.   Cardiovascular: Negative.  Negative for chest pain and palpitations.  Gastrointestinal: Negative.   Neurological: Negative.   Psychiatric/Behavioral: Negative.      Today's Vitals   12/13/18 0836  BP: 112/70  Pulse: 68  Temp: 98.5 F (36.9 C)  TempSrc: Oral  Weight: 263 lb 12.8 oz (119.7 kg)  Height: _0  (1.905 m)   PainSc: 0-No pain   Body mass index is 32.97 kg/m.   Objective:  Physical Exam Vitals signs and nursing note reviewed.  Constitutional:      Appearance: Normal appearance.  Cardiovascular:     Rate and Rhythm: Normal rate and regular rhythm.     Heart sounds: Normal heart sounds.  Pulmonary:     Effort: Pulmonary effort is normal.     Breath sounds: Normal breath sounds.  Skin:    General: Skin is warm.  Neurological:     General: No focal deficit present.     Mental Status: He is alert.  Psychiatric:        Mood and Affect: Mood normal.         Assessment And Plan:     1. Essential hypertension, benign  Well controlled. He will continue with current meds. He is encouraged to avoid adding salt to his foods. I will request his most recent echocardiogram from his cardiologist, Dr. Einar Gip. He will rto in August 2020 for his next AWV.   - BMP8+EGFR  2. Obstructive sleep apnea treated with continuous positive airway pressure (CPAP)  Chronic. He reports compliance with CPAP.   3. Other abnormal glucose  I will recheck his a1c at his next visit. He is encouraged to incorporate more exercise into his daily routine.   4. Cigarette nicotine dependence without complication  He reports a pack now lasts  him 3-5 days. He has cut back on the number of cigs smoked per day.   5. Class 1 obesity due to excess calories with serious comorbidity and body mass index (BMI) of 32.0 to 32.9 in adult  Importance of achieving optimal weight to decrease risk of cardiovascular disease and cancers was discussed with the patient in full detail. He is encouraged to start slowly - start with 10 minutes twice daily at least three to four days per week and to gradually build to 30 minutes five days weekly. He was given tips to incorporate more activity into her daily routine - take stairs when possible, park farther away from grocery stores, etc.    [name], MD    THE PATIENT IS  ENCOURAGED TO PRACTICE SOCIAL DISTANCING DUE TO THE COVID-19 PANDEMIC.

## 2018-12-13 NOTE — Patient Instructions (Signed)

## 2018-12-14 LAB — BMP8+EGFR
BUN/Creatinine Ratio: 15 (ref 10–24)
BUN: 13 mg/dL (ref 8–27)
CO2: 25 mmol/L (ref 20–29)
Calcium: 9.5 mg/dL (ref 8.6–10.2)
Chloride: 101 mmol/L (ref 96–106)
Creatinine, Ser: 0.86 mg/dL (ref 0.76–1.27)
GFR calc Af Amer: 105 mL/min/{1.73_m2} (ref >59)
GFR calc non Af Amer: 91 mL/min/{1.73_m2} (ref >59)
Glucose: 155 mg/dL — ABNORMAL HIGH (ref 65–99)
Potassium: 3.3 mmol/L — ABNORMAL LOW (ref 3.5–5.2)
Sodium: 139 mmol/L (ref 134–144)

## 2018-12-27 ENCOUNTER — Ambulatory Visit: Payer: Medicare Other

## 2019-01-10 ENCOUNTER — Other Ambulatory Visit: Payer: Self-pay

## 2019-01-10 ENCOUNTER — Ambulatory Visit (INDEPENDENT_AMBULATORY_CARE_PROVIDER_SITE_OTHER): Payer: Medicare Other

## 2019-01-10 VITALS — Ht 75.0 in | Wt 263.0 lb

## 2019-01-10 DIAGNOSIS — Z0000 Encounter for general adult medical examination without abnormal findings: Secondary | ICD-10-CM | POA: Diagnosis not present

## 2019-01-10 NOTE — Progress Notes (Signed)
Subjective:   Terry Andrews is a 68 y.o. male who presents for Medicare Annual/Subsequent preventive examination.  This visit type was conducted due to national recommendations for restrictions regarding the COVID-19 Pandemic (e.g. social distancing). This format is felt to be most appropriate for this patient at this time. All issues noted in this document were discussed and addressed. No physical exam was performed (except for noted visual exam findings with Video Visits). This patient, Terry Andrews, has given permission to perform this visit via telephone. Vital signs may be absent or patient reported.  Patient location:  At home  Nurse location:  [place] office     Review of Systems:  n/a Cardiac Risk Factors include: advanced age (>34men, >54 women);hypertension;male gender;smoking/ tobacco exposure;sedentary lifestyle;obesity (BMI >30kg/m2)     Objective:    Vitals: Ht 6\' 3"  (1.905 m) Comment: per patient  Wt 263 lb (119.3 kg) Comment: per patient  BMI 32.87 kg/m   Body mass index is 32.87 kg/m.  Advanced Directives 01/10/2019  Does Patient Have a Medical Advance Directive? Yes  Type of Advance Directive Healthcare Power of Attorney    Tobacco Social History   Tobacco Use  Smoking Status Current Every Day Smoker  . Packs/day: 0.25  . Years: 30.00  . Pack years: 7.50  . Types: Cigarettes  Smokeless Tobacco Never Used     Ready to quit: No Counseling given: Yes   Clinical Intake:  Pre-visit preparation completed: Yes  Pain : No/denies pain Pain Score: 0-No pain     Nutritional Status: BMI > 30  Obese Nutritional Risks: None Diabetes: No  How often do you need to have someone help you when you read instructions, pamphlets, or other written materials from your doctor or pharmacy?: 1 - Never What is the last grade level you completed in school?: some college  Interpreter Needed?: No  Information entered by :: NAllen LPN  Past Medical History:   Diagnosis Date  . GERD (gastroesophageal reflux disease)   . H/O: CVA (cerebrovascular accident)   . Hypertension   . OSA (obstructive sleep apnea)    History reviewed. No pertinent surgical history. Family History  Problem Relation Age of Onset  . Cancer Mother   . Stroke Father   . Heart disease Father    Social History   Socioeconomic History  . Marital status: Single    Spouse name: Not on file  . Number of children: 2  . Years of education: College  . Highest education level: Not on file  Occupational History  . Occupation: retired  Scientific laboratory technician  . Financial resource strain: Not hard at all  . Food insecurity:    Worry: Never true    Inability: Never true  . Transportation needs:    Medical: No    Non-medical: No  Tobacco Use  . Smoking status: Current Every Day Smoker    Packs/day: 0.25    Years: 30.00    Pack years: 7.50    Types: Cigarettes  . Smokeless tobacco: Never Used  Substance and Sexual Activity  . Alcohol use: Yes    Alcohol/week: 0.0 standard drinks    Comment: Occasional   . Drug use: Yes    Types: Marijuana    Comment: every once in a while  . Sexual activity: Yes  Lifestyle  . Physical activity:    Days per week: 0 days    Minutes per session: 0 min  . Stress: Not at all  Relationships  .  Social connections:    Talks on phone: Not on file    Gets together: Not on file    Attends religious service: Not on file    Active member of club or organization: Not on file    Attends meetings of clubs or organizations: Not on file    Relationship status: Not on file  Other Topics Concern  . Not on file  Social History Narrative   Some caffeine use     Outpatient Encounter Medications as of 01/10/2019  Medication Sig  . aspirin 81 MG tablet Take 81 mg by mouth daily.  Marland Kitchen EDARBYCLOR 40-25 MG TABS TAKE (1) TABLET DAILY IN THE MORNING.  . multivitamin-iron-minerals-folic acid (CENTRUM) chewable tablet Chew 1 tablet by mouth daily.  Marland Kitchen  spironolactone (ALDACTONE) 25 MG tablet Take 25 mg by mouth daily.  . verapamil (CALAN-SR) 240 MG CR tablet 2 (two) times daily.    No facility-administered encounter medications on file as of 01/10/2019.     Activities of Daily Living In your present state of health, do you have any difficulty performing the following activities: 01/10/2019  Hearing? N  Vision? N  Difficulty concentrating or making decisions? N  Walking or climbing stairs? N  Dressing or bathing? N  Doing errands, shopping? N  Preparing Food and eating ? N  Using the Toilet? N  In the past six months, have you accidently leaked urine? N  Do you have problems with loss of bowel control? N  Managing your Medications? N  Managing your Finances? N  Housekeeping or managing your Housekeeping? N  Some recent data might be hidden    Patient Care Team: Glendale Chard, MD as PCP - General (Internal Medicine) Adrian Prows, MD as PCP - Cardiology (Cardiology)   Assessment:   This is a routine wellness examination for [name].  Exercise Activities and Dietary recommendations Current Exercise Habits: The patient does not participate in regular exercise at present  Goals    . Patient Stated     Stay alive        Fall Risk Fall Risk  01/10/2019 12/13/2018 08/11/2018  Falls in the past year? 0 0 0  Risk for fall due to : Medication side effect - -  Follow up Falls prevention discussed - -   Is the patient's home free of loose throw rugs in walkways, pet beds, electrical cords, etc?   yes      Grab bars in the bathroom? no      Handrails on the stairs?   yes      Adequate lighting?   yes  Timed Get Up and Go Performed: n/a  Depression Screen PHQ 2/9 Scores 01/10/2019 12/13/2018 08/11/2018  PHQ - 2 Score 0 0 0  PHQ- 9 Score 0 - -    Cognitive Function     6CIT Screen 01/10/2019  What Year? 0 points  What month? 0 points  What time? 3 points  Count back from 20 0 points  Months in reverse 0 points  Repeat phrase 0 points   Total Score 3    Immunization History  Administered Date(s) Administered  . Tdap 02/18/2017    Qualifies for Shingles Vaccine? yes  Screening Tests Health Maintenance  Topic Date Due  . HIV Screening  08/12/2019 (Originally 06/01/1966)  . PNA vac Low Risk Adult (1 of 2 - PCV13) 01/10/2020 (Originally 06/01/2018)  . INFLUENZA VACCINE  03/04/2019  . TETANUS/TDAP  02/19/2027  . COLONOSCOPY  11/03/2027  . Hepatitis  C Screening  Completed   Cancer Screenings: Lung: Low Dose CT Chest recommended if Age 54-80 years, 30 pack-year currently smoking OR have quit w/in 15years. Patient does not qualify. Colorectal: up to date  Additional Screenings:  Hepatitis C Screening: 08/11/2018      Plan:    Declines vaccinations.  I have personally reviewed and noted the following in the patient's chart:   . Medical and social history . Use of alcohol, tobacco or illicit drugs  . Current medications and supplements . Functional ability and status . Nutritional status . Physical activity . Advanced directives . List of other physicians . Hospitalizations, surgeries, and ER visits in previous 12 months . Vitals . Screenings to include cognitive, depression, and falls . Referrals and appointments  In addition, I have reviewed and discussed with patient certain preventive protocols, quality metrics, and best practice recommendations. A written personalized care plan for preventive services as well as general preventive health recommendations were provided to patient.     Kellie Simmering, LPN  10/07/1060

## 2019-01-10 NOTE — Patient Instructions (Signed)
Terry Andrews , Thank you for taking time to come for your Medicare Wellness Visit. I appreciate your ongoing commitment to your health goals. Please review the following plan we discussed and let me know if I can assist you in the future.   Screening recommendations/referrals: Colonoscopy: 11/2017 Recommended yearly ophthalmology/optometry visit for glaucoma screening and checkup Recommended yearly dental visit for hygiene and checkup  Vaccinations: Influenza vaccine: declines Pneumococcal vaccine: declines Tdap vaccine: 01/2017 Shingles vaccine: discussed    Advanced directives: copy in chart  Conditions/risks identified: obesity  Next appointment: 03/08/2019 at 9:00  Preventive Care 17 Years and Older, Male Preventive care refers to lifestyle choices and visits with your health care provider that can promote health and wellness. What does preventive care include?  A yearly physical exam. This is also called an annual well check.  Dental exams once or twice a year.  Routine eye exams. Ask your health care provider how often you should have your eyes checked.  Personal lifestyle choices, including:  Daily care of your teeth and gums.  Regular physical activity.  Eating a healthy diet.  Avoiding tobacco and drug use.  Limiting alcohol use.  Practicing safe sex.  Taking low doses of aspirin every day.  Taking vitamin and mineral supplements as recommended by your health care provider. What happens during an annual well check? The services and screenings done by your health care provider during your annual well check will depend on your age, overall health, lifestyle risk factors, and family history of disease. Counseling  Your health care provider may ask you questions about your:  Alcohol use.  Tobacco use.  Drug use.  Emotional well-being.  Home and relationship well-being.  Sexual activity.  Eating habits.  History of falls.  Memory and ability to  understand (cognition).  Work and work Statistician. Screening  You may have the following tests or measurements:  Height, weight, and BMI.  Blood pressure.  Lipid and cholesterol levels. These may be checked every 5 years, or more frequently if you are over 68 years old.  Skin check.  Lung cancer screening. You may have this screening every year starting at age 16 if you have a 30-pack-year history of smoking and currently smoke or have quit within the past 15 years.  Fecal occult blood test (FOBT) of the stool. You may have this test every year starting at age 33.  Flexible sigmoidoscopy or colonoscopy. You may have a sigmoidoscopy every 5 years or a colonoscopy every 10 years starting at age 72.  Prostate cancer screening. Recommendations will vary depending on your family history and other risks.  Hepatitis C blood test.  Hepatitis B blood test.  Sexually transmitted disease (STD) testing.  Diabetes screening. This is done by checking your blood sugar (glucose) after you have not eaten for a while (fasting). You may have this done every 1-3 years.  Abdominal aortic aneurysm (AAA) screening. You may need this if you are a current or former smoker.  Osteoporosis. You may be screened starting at age 40 if you are at high risk. Talk with your health care provider about your test results, treatment options, and if necessary, the need for more tests. Vaccines  Your health care provider may recommend certain vaccines, such as:  Influenza vaccine. This is recommended every year.  Tetanus, diphtheria, and acellular pertussis (Tdap, Td) vaccine. You may need a Td booster every 10 years.  Zoster vaccine. You may need this after age 42.  Pneumococcal 13-valent conjugate (  PCV13) vaccine. One dose is recommended after age 36.  Pneumococcal polysaccharide (PPSV23) vaccine. One dose is recommended after age 39. Talk to your health care provider about which screenings and vaccines you  need and how often you need them. This information is not intended to replace advice given to you by your health care provider. Make sure you discuss any questions you have with your health care provider. Document Released: 08/16/2015 Document Revised: 04/08/2016 Document Reviewed: 05/21/2015 Elsevier Interactive Patient Education  2017 [employer] Prevention in the Home Falls can cause injuries. They can happen to people of all ages. There are many things you can do to make your home safe and to help prevent falls. What can I do on the outside of my home?  Regularly fix the edges of walkways and driveways and fix any cracks.  Remove anything that might make you trip as you walk through a door, such as a raised step or threshold.  Trim any bushes or trees on the path to your home.  Use bright outdoor lighting.  Clear any walking paths of anything that might make someone trip, such as rocks or tools.  Regularly check to see if handrails are loose or broken. Make sure that both sides of any steps have handrails.  Any raised decks and porches should have guardrails on the edges.  Have any leaves, snow, or ice cleared regularly.  Use sand or salt on walking paths during winter.  Clean up any spills in your garage right away. This includes oil or grease spills. What can I do in the bathroom?  Use night lights.  Install grab bars by the toilet and in the tub and shower. Do not use towel bars as grab bars.  Use non-skid mats or decals in the tub or shower.  If you need to sit down in the shower, use a plastic, non-slip stool.  Keep the floor dry. Clean up any water that spills on the floor as soon as it happens.  Remove soap buildup in the tub or shower regularly.  Attach bath mats securely with double-sided non-slip rug tape.  Do not have throw rugs and other things on the floor that can make you trip. What can I do in the bedroom?  Use night lights.  Make sure  that you have a light by your bed that is easy to reach.  Do not use any sheets or blankets that are too big for your bed. They should not hang down onto the floor.  Have a firm chair that has side arms. You can use this for support while you get dressed.  Do not have throw rugs and other things on the floor that can make you trip. What can I do in the kitchen?  Clean up any spills right away.  Avoid walking on wet floors.  Keep items that you use a lot in easy-to-reach places.  If you need to reach something above you, use a strong step stool that has a grab bar.  Keep electrical cords out of the way.  Do not use floor polish or wax that makes floors slippery. If you must use wax, use non-skid floor wax.  Do not have throw rugs and other things on the floor that can make you trip. What can I do with my stairs?  Do not leave any items on the stairs.  Make sure that there are handrails on both sides of the stairs and use them. Fix handrails that  are broken or loose. Make sure that handrails are as long as the stairways.  Check any carpeting to make sure that it is firmly attached to the stairs. Fix any carpet that is loose or worn.  Avoid having throw rugs at the top or bottom of the stairs. If you do have throw rugs, attach them to the floor with carpet tape.  Make sure that you have a light switch at the top of the stairs and the bottom of the stairs. If you do not have them, ask someone to add them for you. What else can I do to help prevent falls?  Wear shoes that:  Do not have high heels.  Have rubber bottoms.  Are comfortable and fit you well.  Are closed at the toe. Do not wear sandals.  If you use a stepladder:  Make sure that it is fully opened. Do not climb a closed stepladder.  Make sure that both sides of the stepladder are locked into place.  Ask someone to hold it for you, if possible.  Clearly mark and make sure that you can see:  Any grab bars or  handrails.  First and last steps.  Where the edge of each step is.  Use tools that help you move around (mobility aids) if they are needed. These include:  Canes.  Walkers.  Scooters.  Crutches.  Turn on the lights when you go into a dark area. Replace any light bulbs as soon as they burn out.  Set up your furniture so you have a clear path. Avoid moving your furniture around.  If any of your floors are uneven, fix them.  If there are any pets around you, be aware of where they are.  Review your medicines with your doctor. Some medicines can make you feel dizzy. This can increase your chance of falling. Ask your doctor what other things that you can do to help prevent falls. This information is not intended to replace advice given to you by your health care provider. Make sure you discuss any questions you have with your health care provider. Document Released: 05/16/2009 Document Revised: 12/26/2015 Document Reviewed: 08/24/2014 Elsevier Interactive Patient Education  2017 [employer].

## 2019-02-10 DIAGNOSIS — G4733 Obstructive sleep apnea (adult) (pediatric): Secondary | ICD-10-CM | POA: Diagnosis not present

## 2019-02-19 ENCOUNTER — Other Ambulatory Visit: Payer: Self-pay | Admitting: Cardiology

## 2019-03-08 ENCOUNTER — Ambulatory Visit: Payer: Medicare Other

## 2019-03-08 ENCOUNTER — Other Ambulatory Visit: Payer: Self-pay

## 2019-03-08 ENCOUNTER — Encounter: Payer: Self-pay | Admitting: Internal Medicine

## 2019-03-08 ENCOUNTER — Encounter: Payer: Medicare Other | Admitting: Internal Medicine

## 2019-03-08 ENCOUNTER — Ambulatory Visit (INDEPENDENT_AMBULATORY_CARE_PROVIDER_SITE_OTHER): Payer: Medicare Other | Admitting: Internal Medicine

## 2019-03-08 VITALS — BP 112/68 | HR 64 | Temp 98.9°F | Ht 73.8 in | Wt 266.0 lb

## 2019-03-08 DIAGNOSIS — R195 Other fecal abnormalities: Secondary | ICD-10-CM | POA: Diagnosis not present

## 2019-03-08 DIAGNOSIS — R7309 Other abnormal glucose: Secondary | ICD-10-CM | POA: Diagnosis not present

## 2019-03-08 DIAGNOSIS — Z125 Encounter for screening for malignant neoplasm of prostate: Secondary | ICD-10-CM | POA: Diagnosis not present

## 2019-03-08 DIAGNOSIS — Z0000 Encounter for general adult medical examination without abnormal findings: Secondary | ICD-10-CM | POA: Diagnosis not present

## 2019-03-08 DIAGNOSIS — I10 Essential (primary) hypertension: Secondary | ICD-10-CM | POA: Diagnosis not present

## 2019-03-08 DIAGNOSIS — Z6832 Body mass index (BMI) 32.0-32.9, adult: Secondary | ICD-10-CM

## 2019-03-08 DIAGNOSIS — E66811 Obesity, class 1: Secondary | ICD-10-CM

## 2019-03-08 DIAGNOSIS — E6609 Other obesity due to excess calories: Secondary | ICD-10-CM

## 2019-03-08 DIAGNOSIS — F17210 Nicotine dependence, cigarettes, uncomplicated: Secondary | ICD-10-CM

## 2019-03-08 LAB — POCT URINALYSIS DIPSTICK
Bilirubin, UA: NEGATIVE
Blood, UA: NEGATIVE
Glucose, UA: NEGATIVE
Ketones, UA: NEGATIVE
Leukocytes, UA: NEGATIVE
Nitrite, UA: NEGATIVE
Protein, UA: NEGATIVE
Spec Grav, UA: 1.02 (ref 1.010–1.025)
Urobilinogen, UA: 0.2 E.U./dL
pH, UA: 7 (ref 5.0–8.0)

## 2019-03-08 LAB — POCT UA - MICROALBUMIN
Albumin/Creatinine Ratio, Urine, POC: <30
Creatinine, POC: 100 mg/dL
Microalbumin Ur, POC: 10 mg/L

## 2019-03-08 LAB — POC HEMOCCULT BLD/STL (OFFICE/1-CARD/DIAGNOSTIC)
Card #1 Date: 8052020
Fecal Occult Blood, POC: POSITIVE — AB

## 2019-03-08 NOTE — Patient Instructions (Signed)

## 2019-03-08 NOTE — Progress Notes (Signed)
Subjective:     [name] , male    DOB: 1951/04/27 , 68 y.o.   MRN: [id number]   Chief Complaint  Patient presents with  . Annual Exam  . Hypertension    HPI  He is here today for a full physical examination.  He denies having any specific concerns or complaints at this time.   Hypertension This is a chronic problem. The current episode started more than 1 year ago. The problem has been gradually improving since onset. The problem is controlled. Pertinent negatives include no blurred vision, chest pain, palpitations or shortness of breath. Risk factors for coronary artery disease include obesity, sedentary lifestyle, smoking/tobacco exposure and male gender. Past treatments include diuretics and angiotensin blockers. Compliance problems include exercise.      Past Medical History:  Diagnosis Date  . GERD (gastroesophageal reflux disease)   . H/O: CVA (cerebrovascular accident)   . Hypertension   . OSA (obstructive sleep apnea)      Family History  Problem Relation Age of Onset  . Cancer Mother   . Stroke Father   . Heart disease Father      Current Outpatient Medications:  .  aspirin 81 MG tablet, Take 81 mg by mouth daily., Disp: , Rfl:  .  EDARBYCLOR 40-25 MG TABS, TAKE (1) TABLET DAILY IN THE MORNING., Disp: 90 tablet, Rfl: 1 .  Omega-3 Fatty Acids (FISH OIL PO), Take by mouth. 1 daily, Disp: , Rfl:  .  spironolactone (ALDACTONE) 25 MG tablet, TAKE 1 TABLET BY MOUTH  EVERY MORNING, Disp: 90 tablet, Rfl: 0 .  verapamil (CALAN-SR) 240 MG CR tablet, 2 (two) times daily. , Disp: , Rfl:    No Known Allergies   Men's preventive visit. Patient Health Questionnaire (PHQ-2) is    Office Visit from 03/08/2019 in [facility]  PHQ-2 Total Score  0    . Patient is on a "healthy" diet. Marital status: Single. Relevant history for alcohol use is:  Social History   Substance and Sexual Activity  Alcohol Use Yes  . Alcohol/week: 0.0  standard drinks   Comment: Occasional   . Relevant history for tobacco use is:  Social History   Tobacco Use  Smoking Status Current Every Day Smoker  . Packs/day: 0.25  . Years: 30.00  . Pack years: 7.50  . Types: Cigarettes  Smokeless Tobacco Never Used  Tobacco Comment   He is not ready to quit.   .  Review of Systems  Constitutional: Negative.   HENT: Negative.   Eyes: Negative.  Negative for blurred vision.  Respiratory: Negative.  Negative for shortness of breath.   Cardiovascular: Negative.  Negative for chest pain and palpitations.  Endocrine: Negative.   Genitourinary: Negative.   Musculoskeletal: Negative.   Skin: Negative.   Allergic/Immunologic: Negative.   Neurological: Negative.   Hematological: Negative.   Psychiatric/Behavioral: Negative.      Today's Vitals   03/08/19 0916  BP: 112/68  Pulse: 64  Temp: 98.9 F (37.2 C)  TempSrc: Oral  Weight: 266 lb (120.7 kg)  Height: 6' 1.8" (1.875 m)   Body mass index is 34.34 kg/m.   Objective:  Physical Exam Vitals signs and nursing note reviewed.  Constitutional:      Appearance: Normal appearance. He is obese.  HENT:     Head: Normocephalic and atraumatic.     Right Ear: Tympanic membrane, ear canal and external ear normal.     Left Ear:  Tympanic membrane, ear canal and external ear normal.     Nose: Nose normal.     Mouth/Throat:     Mouth: Mucous membranes are moist.     Pharynx: Oropharynx is clear.  Eyes:     Extraocular Movements: Extraocular movements intact.     Conjunctiva/sclera: Conjunctivae normal.     Pupils: Pupils are equal, round, and reactive to light.  Neck:     Musculoskeletal: Normal range of motion and neck supple.  Cardiovascular:     Rate and Rhythm: Normal rate and regular rhythm.     Pulses: Normal pulses.     Heart sounds: Normal heart sounds.  Pulmonary:     Effort: Pulmonary effort is normal.     Breath sounds: Normal breath sounds.  Chest:     Breasts:         Right: Normal. No swelling, bleeding, inverted nipple, mass or nipple discharge.        Left: Normal. No swelling, bleeding, inverted nipple, mass or nipple discharge.  Abdominal:     General: Bowel sounds are normal.     Palpations: Abdomen is soft.  Genitourinary:    Prostate: Normal.     Rectum: Normal. Guaiac result positive.  Musculoskeletal: Normal range of motion.  Skin:    General: Skin is warm.     Comments: Scattered areas of hyperpigmentation  Neurological:     General: No focal deficit present.     Mental Status: He is alert.  Psychiatric:        Mood and Affect: Mood normal.        Behavior: Behavior normal.         Assessment And Plan:     1. Routine general medical examination at health care facility  A full exam was performed. DRE performed, stool is heme positive.  PATIENT HAS BEEN ADVISED TO GET 30-45 MINUTES REGULAR EXERCISE NO LESS THAN FOUR TO FIVE DAYS PER WEEK - BOTH WEIGHTBEARING EXERCISES AND AEROBIC ARE RECOMMENDED.  HE IS ADVISED TO FOLLOW A HEALTHY DIET WITH AT LEAST SIX FRUITS/VEGGIES PER DAY, DECREASE INTAKE OF RED MEAT, AND TO INCREASE FISH INTAKE TO TWO DAYS PER WEEK.  MEATS/FISH SHOULD NOT BE FRIED, BAKED OR BROILED IS PREFERABLE.  I SUGGEST WEARING SPF 50 SUNSCREEN ON EXPOSED PARTS AND ESPECIALLY WHEN IN THE DIRECT SUNLIGHT FOR AN EXTENDED PERIOD OF TIME.  PLEASE AVOID FAST FOOD RESTAURANTS AND INCREASE YOUR WATER INTAKE.  - POC Hemoccult Bld/Stl (1-Cd Office Dx)  2. Essential hypertension, benign  Chronic, well controlled. He will continue with current meds. He is encouraged to avoid adding salt to his foods. EKG performed, NSR w/ 1st degree AVB, nonspecific QRS widening. I plan to forward copy of EKGs to his cardiologist, DR. Ganji. Pt is without chest pain and shortness of breath.   - EKG 12-Lead - CMP14+EGFR - CBC - Lipid panel  3. Other abnormal glucose  HIS A1C HAS BEEN ELEVATED IN THE PAST. I WILL CHECK AN A1C, BMET TODAY. HE WAS  ENCOURAGED TO AVOID SUGARY BEVERAGES AND PROCESSED FOODS INCLUDNG BREADS, RICE AND PASTA.  - Hemoglobin A1c  4. Cigarette nicotine dependence without complication  Chronic. He does not wish to quit at this time. Importance of smoking cessation was discussed for greater than 3 minutes. He is encouraged to cut back on number of cigs smoked per day. He is encouraged to avoid smoking inside the home and in his car.    5. Heme positive stool  Last  colonoscopy was April 2019. He does not wish to have GI evaluation at this time. He is encouraged to let me know if his sx recur. I will check CBC today.  6. Class 1 obesity due to excess calories with serious comorbidity and body mass index (BMI) of 32.0 to 32.9 in adult  He is encouraged to strive for BMI less than 29 to decrease cardiac risk. He is encouraged to start walking 30 minutes four to five days per week.  7. Encounter for prostate cancer screening  - PSA        [name], MD    THE PATIENT IS ENCOURAGED TO PRACTICE SOCIAL DISTANCING DUE TO THE COVID-19 PANDEMIC.

## 2019-03-09 LAB — CMP14+EGFR
ALT: 15 IU/L (ref 0–44)
AST: 16 IU/L (ref 0–40)
Albumin/Globulin Ratio: 1.2 (ref 1.2–2.2)
Albumin: 3.7 g/dL — ABNORMAL LOW (ref 3.8–4.8)
Alkaline Phosphatase: 50 IU/L (ref 39–117)
BUN/Creatinine Ratio: 11 (ref 10–24)
BUN: 11 mg/dL (ref 8–27)
Bilirubin Total: <0.2 mg/dL (ref 0.0–1.2)
CO2: 27 mmol/L (ref 20–29)
Calcium: 10 mg/dL (ref 8.6–10.2)
Chloride: 99 mmol/L (ref 96–106)
Creatinine, Ser: 1.01 mg/dL (ref 0.76–1.27)
GFR calc Af Amer: 90 mL/min/{1.73_m2} (ref >59)
GFR calc non Af Amer: 78 mL/min/{1.73_m2} (ref >59)
Globulin, Total: 3 g/dL (ref 1.5–4.5)
Glucose: 102 mg/dL — ABNORMAL HIGH (ref 65–99)
Potassium: 3.4 mmol/L — ABNORMAL LOW (ref 3.5–5.2)
Sodium: 141 mmol/L (ref 134–144)
Total Protein: 6.7 g/dL (ref 6.0–8.5)

## 2019-03-09 LAB — CBC
Hematocrit: 41.6 % (ref 37.5–51.0)
Hemoglobin: 14 g/dL (ref 13.0–17.7)
MCH: 31.3 pg (ref 26.6–33.0)
MCHC: 33.7 g/dL (ref 31.5–35.7)
MCV: 93 fL (ref 79–97)
Platelets: 259 10*3/uL (ref 150–450)
RBC: 4.48 x10E6/uL (ref 4.14–5.80)
RDW: 13.2 % (ref 11.6–15.4)
WBC: 5.8 10*3/uL (ref 3.4–10.8)

## 2019-03-09 LAB — LIPID PANEL
Chol/HDL Ratio: 2.7 ratio (ref 0.0–5.0)
Cholesterol, Total: 128 mg/dL (ref 100–199)
HDL: 47 mg/dL (ref >39)
LDL Calculated: 72 mg/dL (ref 0–99)
Triglycerides: 47 mg/dL (ref 0–149)
VLDL Cholesterol Cal: 9 mg/dL (ref 5–40)

## 2019-03-09 LAB — PSA: Prostate Specific Ag, Serum: 1.2 ng/mL (ref 0.0–4.0)

## 2019-03-09 LAB — HEMOGLOBIN A1C
Est. average glucose Bld gHb Est-mCnc: 128 mg/dL
Hgb A1c MFr Bld: 6.1 % — ABNORMAL HIGH (ref 4.8–5.6)

## 2019-03-10 DIAGNOSIS — H2513 Age-related nuclear cataract, bilateral: Secondary | ICD-10-CM | POA: Diagnosis not present

## 2019-03-10 DIAGNOSIS — H43812 Vitreous degeneration, left eye: Secondary | ICD-10-CM | POA: Diagnosis not present

## 2019-03-10 DIAGNOSIS — H25013 Cortical age-related cataract, bilateral: Secondary | ICD-10-CM | POA: Diagnosis not present

## 2019-03-22 ENCOUNTER — Encounter: Payer: Self-pay | Admitting: Cardiology

## 2019-03-23 ENCOUNTER — Encounter: Payer: Self-pay | Admitting: Cardiology

## 2019-03-23 ENCOUNTER — Ambulatory Visit (INDEPENDENT_AMBULATORY_CARE_PROVIDER_SITE_OTHER): Payer: Medicare Other | Admitting: Cardiology

## 2019-03-23 ENCOUNTER — Other Ambulatory Visit: Payer: Self-pay

## 2019-03-23 VITALS — BP 131/79 | HR 63 | Ht 75.0 in | Wt 262.2 lb

## 2019-03-23 DIAGNOSIS — Z136 Encounter for screening for cardiovascular disorders: Secondary | ICD-10-CM | POA: Diagnosis not present

## 2019-03-23 DIAGNOSIS — G4733 Obstructive sleep apnea (adult) (pediatric): Secondary | ICD-10-CM | POA: Diagnosis not present

## 2019-03-23 DIAGNOSIS — I10 Essential (primary) hypertension: Secondary | ICD-10-CM

## 2019-03-23 DIAGNOSIS — I119 Hypertensive heart disease without heart failure: Secondary | ICD-10-CM

## 2019-03-23 DIAGNOSIS — Z9989 Dependence on other enabling machines and devices: Secondary | ICD-10-CM

## 2019-03-23 DIAGNOSIS — E876 Hypokalemia: Secondary | ICD-10-CM

## 2019-03-23 MED ORDER — POTASSIUM CHLORIDE CRYS ER 20 MEQ PO TBCR: 20.0000 meq | EXTENDED_RELEASE_TABLET | Freq: Every day | ORAL | 3 refills | Status: DC

## 2019-03-23 NOTE — Progress Notes (Signed)
Primary Physician/Referring:  Glendale Chard, MD  [name], male    DOB: 10/15/1950, 68 y.o.   MRN: [id number]  Chief Complaint  Patient presents with  . Hypertension   HPI:    HPI: Terry Andrews  is a 68 y.o. African-American male with hypertension, history of remote stroke in 2003 with no residual defects, who presents here for follow-up of hypertension.   Patient presents for 1 year follow up for hypertension. Presently he is doing well and denies any chest pain, shortness of breath, PND or orthopnea. He denies any syncope, neurological weaknesses, tingling or numbness in the extremities. He is tolerating the medications without any side effects. He continues to smoke and does not wish to quit smoking. He is using CPAP nightly.  Past Medical History:  Diagnosis Date  . GERD (gastroesophageal reflux disease)   . H/O: CVA (cerebrovascular accident)   . Hypertension   . OSA (obstructive sleep apnea)    History reviewed. No pertinent surgical history. Social History   Socioeconomic History  . Marital status: Single    Spouse name: Not on file  . Number of children: 2  . Years of education: College  . Highest education level: Not on file  Occupational History  . Occupation: retired  Scientific laboratory technician  . Financial resource strain: Not hard at all  . Food insecurity    Worry: Never true    Inability: Never true  . Transportation needs    Medical: No    Non-medical: No  Tobacco Use  . Smoking status: Current Every Day Smoker    Packs/day: 0.25    Years: 30.00    Pack years: 7.50    Types: Cigarettes  . Smokeless tobacco: Never Used  . Tobacco comment: He is not ready to quit.   Substance and Sexual Activity  . Alcohol use: Yes    Alcohol/week: 0.0 standard drinks    Comment: Occasional   . Drug use: Yes    Types: Marijuana    Comment: every once in a while  . Sexual activity: Yes  Lifestyle  . Physical activity    Days per week: 0 days     Minutes per session: 0 min  . Stress: Not at all  Relationships  . Social Herbalist on phone: Not on file    Gets together: Not on file    Attends religious service: Not on file    Active member of club or organization: Not on file    Attends meetings of clubs or organizations: Not on file    Relationship status: Not on file  . Intimate partner violence    Fear of current or ex partner: No    Emotionally abused: No    Physically abused: No    Forced sexual activity: No  Other Topics Concern  . Not on file  Social History Narrative   Some caffeine use    ROS  Review of Systems  Constitution: Negative for decreased appetite, malaise/fatigue, weight gain and weight loss.  Eyes: Negative for visual disturbance.  Cardiovascular: Negative for chest pain, claudication, dyspnea on exertion, leg swelling, orthopnea, palpitations and syncope.  Respiratory: Negative for hemoptysis and wheezing.   Endocrine: Negative for cold intolerance and heat intolerance.  Hematologic/Lymphatic: Does not bruise/bleed easily.  Skin: Negative for nail changes.  Musculoskeletal: Positive for back pain (mild and chronic). Negative for muscle weakness and myalgias.  Gastrointestinal: Negative for abdominal pain, change in bowel habit,  nausea and vomiting.  Neurological: Negative for difficulty with concentration, dizziness, focal weakness and headaches.  Psychiatric/Behavioral: Negative for altered mental status and suicidal ideas.  All other systems reviewed and are negative.  Objective  Blood pressure 131/79, pulse 63, height 6\' 3"  (1.905 m), weight 262 lb 3.2 oz (118.9 kg), SpO2 98 %. Body mass index is 32.77 kg/m.   Physical Exam  Constitutional: He is oriented to person, place, and time. Vital signs are normal. He appears well-developed and well-nourished.  Mildly obese  HENT:  Head: Normocephalic and atraumatic.  Neck: Normal range of motion.  Cardiovascular: Normal rate, regular  rhythm, intact distal pulses and normal pulses.  Murmur heard.  Midsystolic murmur is present with a grade of 2/6 at the upper right sternal border. Pulmonary/Chest: Effort normal and breath sounds normal. No accessory muscle usage. No respiratory distress.  Abdominal: Soft. Bowel sounds are normal.  Musculoskeletal: Normal range of motion.  Neurological: He is alert and oriented to person, place, and time.  Skin: Skin is warm and dry.  Vitals reviewed.  Radiology: No results found.  Laboratory examination:   Recent Labs    08/11/18 0952 12/13/18 1239 03/08/19 1053  NA 145* 139 141  K 3.2* 3.3* 3.4*  CL 103 101 99  CO2 25 25 27   GLUCOSE 101* 155* 102*  BUN 24 13 11   CREATININE 1.21 0.86 1.01  CALCIUM 9.6 9.5 10.0  GFRNONAA 62 91 78  GFRAA 72 105 90   CMP Latest Ref Rng & Units 03/08/2019 12/13/2018 08/11/2018  Glucose 65 - 99 mg/dL 102(H) 155(H) 101(H)  BUN 8 - 27 mg/dL 11 13 24   Creatinine 0.76 - 1.27 mg/dL 1.01 0.86 1.21  Sodium 134 - 144 mmol/L 141 139 145(H)  Potassium 3.5 - 5.2 mmol/L 3.4(L) 3.3(L) 3.2(L)  Chloride 96 - 106 mmol/L 99 101 103  CO2 20 - 29 mmol/L 27 25 25   Calcium 8.6 - 10.2 mg/dL 10.0 9.5 9.6  Total Protein 6.0 - 8.5 g/dL 6.7 - 6.7  Total Bilirubin 0.0 - 1.2 mg/dL <0.2 - <0.2  Alkaline Phos 39 - 117 IU/L 50 - 53  AST 0 - 40 IU/L 16 - 14  ALT 0 - 44 IU/L 15 - 14   CBC Latest Ref Rng & Units 03/08/2019  WBC 3.4 - 10.8 x10E3/uL 5.8  Hemoglobin 13.0 - 17.7 g/dL 14.0  Hematocrit 37.5 - 51.0 % 41.6  Platelets 150 - 450 x10E3/uL 259   Lipid Panel     Component Value Date/Time   CHOL 128 03/08/2019 1053   TRIG 47 03/08/2019 1053   HDL 47 03/08/2019 1053   CHOLHDL 2.7 03/08/2019 1053   LDLCALC 72 03/08/2019 1053   HEMOGLOBIN A1C Lab Results  Component Value Date   HGBA1C 6.1 (H) 03/08/2019   TSH No results for input(s): TSH in the last 8760 hours. Medications   Current Outpatient Medications  Medication Instructions  . aspirin 81 mg, Oral,  Daily  . EDARBYCLOR 40-25 MG TABS TAKE (1) TABLET DAILY IN THE MORNING.  . Omega-3 Fatty Acids (FISH OIL PO) Oral, 1 daily   . potassium chloride SA (K-DUR) 20 MEQ tablet 20 mEq, Oral, Daily  . spironolactone (ALDACTONE) 25 MG tablet TAKE 1 TABLET BY MOUTH  EVERY MORNING  . verapamil (CALAN-SR) 240 MG CR tablet 2 times daily   Cardiac Studies:   Exercise sestamibi 10/16/11: Ex 5 min, 7.3 METs. Small ant infarct vs attenuation. EF 45% Dil LV. Low risk stress test. Patient  asymptomatic, contineu medical therapy.   Carotid duplex 04/13/11: Mild calcific plaque left carotid artery. No stenosis   Renal duplex 04/13/11: Normal renal duplex scan. Comp to prev, right renal A. do no suggest <60% stenosis.  Echocardiogram  04/15/2017: Left ventricle cavity is normal in size. Mild concentric hypertrophy of the left ventricle. Low normal LV systolic function with global hypokinesis. Visual EF is 50-55%. Doppler evidence of grade I (impaired) diastolic dysfunction, elevated LAP. Mild tricuspid regurgitation. No evidence of pulmonary hypertension. Impaired to the study done on 04/16/2011, EF previously was normal.  Assessment     ICD-10-CM   1. Hypertensive heart disease without heart failure  I11.9   2. Essential hypertension  I10   3. Obstructive sleep apnea treated with continuous positive airway pressure (CPAP)  G47.33    Z99.89   4. Screening for AAA (abdominal aortic aneurysm)  Z13.6 PCV AORTA DUPLEX  5. Hypokalemia  E87.6 potassium chloride SA (K-DUR) 20 MEQ tablet    EKG 03/10/2019: From Dr. Baird Cancer reviewed.  No significant change from  EKG 03/24/2018: Normal sinus rhythm at rate of 91 bpm, left atrial enlargement, poor R-wave progression, cannot exclude anterior infarct old. 2 mm ST elevation V2 through V4, consider acute injury pattern. 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  However this is the best combination for his blood pressure control  I will add 20 mEq of KCl daily.  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  I again discussed with him regarding smoking cessation.  In view of vascular disease, he needs AAA screening as he is 68 years of age.  Lipids are under excellent control.  He does have hyperglycemia that is chronic and stable.  I will see him back in a year.  Adrian Prows, MD, [facility] 03/23/2019, 9:12 AM [facility] [place] Pager: [phone] Office: [phone] If no answer Cell [phone]

## 2019-03-30 ENCOUNTER — Other Ambulatory Visit (INDEPENDENT_AMBULATORY_CARE_PROVIDER_SITE_OTHER): Payer: Medicare Other

## 2019-03-30 DIAGNOSIS — Z1211 Encounter for screening for malignant neoplasm of colon: Secondary | ICD-10-CM | POA: Diagnosis not present

## 2019-03-30 LAB — HEMOCCULT GUIAC POC 1CARD (OFFICE)
Card #2 Fecal Occult Blod, POC: NEGATIVE
Card #3 Fecal Occult Blood, POC: NEGATIVE
Fecal Occult Blood, POC: NEGATIVE

## 2019-05-08 ENCOUNTER — Ambulatory Visit: Payer: Medicare Other | Admitting: Neurology

## 2019-05-08 ENCOUNTER — Other Ambulatory Visit: Payer: Self-pay

## 2019-05-08 ENCOUNTER — Encounter: Payer: Self-pay | Admitting: Neurology

## 2019-05-08 VITALS — BP 125/77 | HR 67 | Ht 75.0 in | Wt 272.0 lb

## 2019-05-08 DIAGNOSIS — G4733 Obstructive sleep apnea (adult) (pediatric): Secondary | ICD-10-CM

## 2019-05-08 DIAGNOSIS — Z9989 Dependence on other enabling machines and devices: Secondary | ICD-10-CM

## 2019-05-08 MED ORDER — VERAPAMIL HCL ER 240 MG PO TBCR: 240.0000 mg | EXTENDED_RELEASE_TABLET | Freq: Two times a day (BID) | ORAL | 0 refills | Status: DC

## 2019-05-08 MED ORDER — SPIRONOLACTONE 25 MG PO TABS: 25.0000 mg | ORAL_TABLET | Freq: Every morning | ORAL | 0 refills | Status: DC

## 2019-05-08 NOTE — Progress Notes (Signed)
Subjective:    Patient ID: IDA UPPAL is a 68 y.o. male.  HPI     Interim history:   Terry Andrews is a very pleasant 68 year old right-handed gentleman with an underlying medical history of reflux disease, hypertension, smoking, heart disease, remote Hx of right pontine stroke in 2003, remote history of substance abuse (2003), and obesity, who presents for follow-up consultation of his OSA, on treatment with AutoPap therapy. The patient is unaccompanied today and presents for his yearly checkup.  I last saw him on 05/05/2018, at which time he was compliant with his AutoPap therapy.  He had a recent stressor as his fianc had a stroke. He was advised to follow-up in 1 year.  Today, 05/08/2019: I reviewed his AutoPap compliance data from 04/03/2019 through 05/02/2019 which is a total of 30 days, during which time he used his AutoPap 28 days with percent use days greater than 4 hours at 93%, indicating excellent compliance with an average usage of 8 hours and 20 minutes, residual AHI at goal at 0.3/h, 95th percentile of pressure at 8.2 cm with a range of 5 cm to 15 cm with EPR.  Leak on the high side with a 95th percentile at 25 L/min.  He reports doing well, notices less good sleep when he skips his AutoPap.  Generally, he is very compliant with treatment, uses nasal pillows with good success, is not particularly bothered by air leakage.  He is up-to-date with his supplies through [facility].  He saw his cardiologist recently and was started on potassium.   Previously:    .   I saw him on 05/05/2016, at which time he was compliant with his AutoPap. He was advised to be fully compliant with AutoPap, and encouraged to quit smoking cigarettes and Marijuana, and limit his alcohol intake.   He saw Cecille Rubin, nurse practitioner in the interim on 05/05/2017, at which time he was compliant with his AutoPap and advised to follow-up in one year routinely.   I reviewed his AutoPap compliance  data from 04/03/2018 through 05/02/2018 which is a total of 30 days, during which time he used his AutoPap 28 days with percent used days greater than 4 hours at 90%, indicating excellent compliance with an average usage of 8 hours and 8 minutes, residual AHI at goal at 0.4 per hour, leak high with the 95th percentile at 28.1 L/m, 95th percentile of pressure at 7.4 cm with a range of 5 cm to 15 cm with EPR.    I saw him on 05/06/2015, at which time he reported doing well. He liked his new CPAP machine. He had some issues with knee pain and back pain. He was seen a chiropractor for this. His cardiologist wanted to see him in one year. He was not quite ready to quit smoking. He was fully compliant with CPAP therapy.   05/05/2016: I reviewed his autoPAP compliance data from 04/03/2016 through 05/02/2016 which is a total of 30 days, during which time he used his machine 29 days with percent used days greater than 4 hours at 97%, indicating excellent compliance with an average usage of 7 hours and 15 minutes, residual AHI low at 0.3 per hour. 95th percentile pressure at 8.6 cm, leak acceptable with the 95th percentile at 19.1 L/m, pressure setting of 5-15 cm with EPR.       I saw him on 01/04/2015, at which time he reported feeling about the same. He did not bring his machine or  SD card. He had no new sleep related complaints. He had lost a few pounds. He was feeling better with CPAP and was using his old machine. I suggested we start him on AutoPap therapy and provide him with a new machine. He reported that his brother has severe OSA and uses a CPAP too.    I reviewed his CPAP compliance data from 04/01/2015 through 04/30/2015 which is a total of 30 days during which time he used his machine every night except for 1, with percent used days greater than 4 hours at 97%, indicating excellent compliance with an average usage of 6 hours and 38 minutes, residual AHI low at 0.5 per hour, leak acceptable with the  95th percentile at 21.9 L/m, borderline, and 95th percentile of pressure at 8.8 cm, setting of AutoPap pap with a range of 5-15 cm with EPR.     I first met him on 11/15/2014 at the request of his cardiologist, at which time the patient reported a prior diagnosis of obstructive sleep apnea and treatment with CPAP for the past 10 years. He had noticed problems with his machine. Prior sleep test results were not available for me. His sleep study was over 10 years ago. I suggested he return for a potential split-night sleep study. He ended up having a baseline sleep study on 11/22/2014 and I went over his test results with him in detail today. His sleep efficiency was reduced markedly at 53%. Of note the patient had difficulty maintaining sleep and particularly had a hard time going back to sleep after 2:15 AM. Sleep latency was normal but wake after sleep onset was nearly 3 hours. There was mild to moderate sleep fragmentation. He had an increased percentage of stage II sleep, and a decreased percentage of REM sleep at 9.7%. He had no significant PLMS. He had occasional PVCs on single-lead EKG. He had mild intermittent snoring. Total AHI was borderline at 4.5 per hour, rising to 32.2 per hour during REM sleep and 6.7 per hour in the supine position. He slept mostly in the supine position. Average oxygen saturation was 90% but there were faulty readings as well on the pulse oximeter throughout the night.      He was diagnosed with obstructive sleep apnea about 10 years ago. He was placed on CPAP therapy. He has been compliant with treatment. About 2 or 3 months ago he started noticing that the machine was making a lot of noise and he actually quit using it. You encouraged him to restart using it and he also had a bad cough and bout of congestion which since then has improved but it made it hard for him to be compliant with CPAP therapy at the time. All in all he estimates that he has over the course of 10 years  always been compliant with treatment about 85% of the time. He uses nasal pillows. He did not bring his machine today.   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 He is single. He has 2 grown daughters. He is the youngest of a total of 7 children. One sister died of breast cancer. Another sister has breast cancer. His brother has sleep apnea and encouraged him to get tested in the past. He still smokes and is not motivated to quit. He lost some weight with your encouragement. He does not have a set bedtime and wake time routine. He drinks caffeine in  the form of coffee, tea and sodas throughout the day. He has a TV on at night throughout the night typically. He likes to sleep on his sides. He likes to take a nap in the afternoons. He is not known to have any parasomnias. He denies restless leg symptoms, morning headaches and nocturia. He wakes up typically quite well rested when he is able to use CPAP but again lately, his machine has been defective.   His DME company is [facility].    Prior sleep test results are not available for my review today. I reviewed your office note from 10/15/2014 which you kindly included. Prior test results were also reviewed including carotid Doppler studies from 04/13/2011 which showed no significant stenosis of either internal carotid artery. Echocardiogram from 04/16/2011 showed normal EF, moderate LVH, grade 2 diastolic dysfunction. According to your office note he was diagnosed with obstructive sleep apnea on 05/21/2015 with a sleep study. Labs from 06/07/2014 showed normal TSH, A1c of 6.6, total cholesterol of 130, LDL at 71, CMP and CBC normal. EKG from 05/03/2014 showed normal sinus rhythm with borderline first-degree AV block and some PVCs. His CPAP has been nearly 66 years old and he has not had re-evaluation of his OSA and nearly 10 years.  His Past Medical History Is Significant For: Past Medical  History:  Diagnosis Date  . GERD (gastroesophageal reflux disease)   . H/O: CVA (cerebrovascular accident)   . Hypertension   . OSA (obstructive sleep apnea)     His Past Surgical History Is Significant For: No past surgical history on file.  His Family History Is Significant For: Family History  Problem Relation Age of Onset  . Cancer Mother   . Stroke Father   . Heart disease Father     His Social History Is Significant For: Social History   Socioeconomic History  . Marital status: Single    Spouse name: Not on file  . Number of children: 2  . Years of education: College  . Highest education level: Not on file  Occupational History  . Occupation: retired  Scientific laboratory technician  . Financial resource strain: Not hard at all  . Food insecurity    Worry: Never true    Inability: Never true  . Transportation needs    Medical: No    Non-medical: No  Tobacco Use  . Smoking status: Current Every Day Smoker    Packs/day: 0.25    Years: 30.00    Pack years: 7.50    Types: Cigarettes  . Smokeless tobacco: Never Used  . Tobacco comment: He is not ready to quit.   Substance and Sexual Activity  . Alcohol use: Yes    Alcohol/week: 0.0 standard drinks    Comment: Occasional   . Drug use: Yes    Types: Marijuana    Comment: every once in a while  . Sexual activity: Yes  Lifestyle  . Physical activity    Days per week: 0 days    Minutes per session: 0 min  . Stress: Not at all  Relationships  . Social Herbalist on phone: Not on file    Gets together: Not on file    Attends religious service: Not on file    Active member of club or organization: Not on file    Attends meetings of clubs or organizations: Not on file    Relationship status: Not on file  Other Topics Concern  . Not on file  Social History Narrative   Some caffeine use     His Allergies Are:  No Known Allergies:   His Current Medications Are:  Outpatient Encounter Medications as of 05/08/2019   Medication Sig  . aspirin 81 MG tablet Take 81 mg by mouth daily.  Marland Kitchen EDARBYCLOR 40-25 MG TABS TAKE (1) TABLET DAILY IN THE MORNING.  . Omega-3 Fatty Acids (FISH OIL PO) Take by mouth. 1 daily  . potassium chloride SA (K-DUR) 20 MEQ tablet Take 1 tablet (20 mEq total) by mouth daily.  Marland Kitchen spironolactone (ALDACTONE) 25 MG tablet TAKE 1 TABLET BY MOUTH  EVERY MORNING  . verapamil (CALAN-SR) 240 MG CR tablet 2 (two) times daily.    No facility-administered encounter medications on file as of 05/08/2019.   :  Review of Systems:  Out of a complete 14 point review of systems, all are reviewed and negative with the exception of these symptoms as listed below:  Review of Systems  Neurological:       Pt presents today to discuss his cpap. Pt reports that his cpap is going well.    Objective:  Neurological Exam  Physical Exam Physical Examination:   Vitals:   05/08/19 0934  BP: 125/77  Pulse: 67    General Examination: The patient is a very pleasant 68 y.o. male in no acute distress. He appears well-developed and well-nourished and well groomed.   HEENT:Normocephalic, atraumatic, pupils are equal, round and reactive to light, extraocular tracking is good without limitation to gaze excursion or nystagmus noted. Normal smooth pursuit is noted. Hearing is grossly intact. Face is symmetric with normal facial animation and normal facial sensation. Speech is clear with no dysarthria noted. There is no hypophonia. There is no lip, neck/head, jaw or voice tremor. Neck with FROM. Oropharynx exam reveals: mild mouth dryness, adequate dental hygiene and airway crowding. Tongue protrudes centrally and palate elevates symmetrically.   Chest:Clear to auscultation without wheezing, rhonchi or crackles noted.  Heart:S1+S2+0, regular and normal without murmurs, rubs or gallops noted.   Abdomen:Soft, non-tender and non-distended with normal bowel sounds appreciated on  auscultation.  Extremities:There is no pitting edema in the distal lower extremities bilaterally.   Skin: Warm and dry without trophic changes noted. There are no varicose veins. Signs of acne/rosacea in the face, unchanged.  Musculoskeletal: exam revealsno significant joint pain.  Neurologically:  Mental status: The patient is awake, alert and oriented in all 4 spheres. His immediate and remote memory, attention, language skills and fund of knowledge are appropriate. There is no evidence of aphasia, agnosia, apraxia or anomia. Speech is clear with normal prosody and enunciation. Thought process is linear. Mood is normal and affect is normal.  Cranial nerves II - XII are as described above under HEENT exam.  Motor exam: Normal bulk, strength and tone is noted. There is no tremor. Fine motor skills and coordination: grossly intact. There is no truncal or gait ataxia.  Sensory exam: intact to light touch.  Gait, station and balance: He stands with mild difficulty. Gait normal.   Assessment and Plan:   In summary, Terry Andrews is a very pleasant 68 year old male with an underlying medical history of reflux disease, hypertension, smoking, heart disease, remote Hx of right pontine stroke (2003, with remote history of substance abuse), and obesity, who presents for follow-up consultation of his obstructive sleep apnea, well-establishedonAutoPap (new machine since 2016). He was issued to CPAP machine in the past but needed a replacement machine. He had a  sleep study in April2016. He has been compliant with treatment. His physical exam is stable. He is commended for his treatment adherence And reports ongoing good results with using AutoPap, notices that he does not sleep as well when he skips it.  He has a slightly higher leak on average but is not bothered by it, uses nasal pillows with success.He was recently started on supplemental potassium by his cardiologist.  Other than that he has been  stable.  He is advised to follow-up routinely in 1 year. I renewed his prescription for CPAP supplies. I answered all his questions today and he was in agreement.

## 2019-05-08 NOTE — Patient Instructions (Signed)
Please continue using your autoPAP regularly. While your insurance requires that you use PAP at least 4 hours each night on 70% of the nights, I recommend, that you not skip any nights and use it throughout the night if you can. Getting used to PAP and staying with the treatment long term does take time and patience and discipline. Untreated obstructive sleep apnea when it is moderate to severe can have an adverse impact on cardiovascular health and raise her risk for heart disease, arrhythmias, hypertension, congestive heart failure, stroke and diabetes. Untreated obstructive sleep apnea causes sleep disruption, nonrestorative sleep, and sleep deprivation. This can have an impact on your day to day functioning and cause daytime sleepiness and impairment of cognitive function, memory loss, mood disturbance, and problems focussing. Using PAP regularly can improve these symptoms.   Keep up the good work! We can see you in 1 year, you can see one of our nurse practitioners as you are stable. I will see you after that.        

## 2019-05-08 NOTE — Progress Notes (Signed)
Order for cpap supplies sent to [facility] via fax. Confirmation received that the order transmitted was successful.  

## 2019-05-09 ENCOUNTER — Other Ambulatory Visit: Payer: Self-pay

## 2019-06-01 ENCOUNTER — Other Ambulatory Visit: Payer: Self-pay | Admitting: Cardiology

## 2019-06-07 ENCOUNTER — Other Ambulatory Visit: Payer: Self-pay

## 2019-06-07 ENCOUNTER — Ambulatory Visit (INDEPENDENT_AMBULATORY_CARE_PROVIDER_SITE_OTHER): Payer: Medicare Other

## 2019-06-07 DIAGNOSIS — I739 Peripheral vascular disease, unspecified: Secondary | ICD-10-CM

## 2019-06-07 DIAGNOSIS — Z136 Encounter for screening for cardiovascular disorders: Secondary | ICD-10-CM

## 2019-06-07 DIAGNOSIS — R0989 Other specified symptoms and signs involving the circulatory and respiratory systems: Secondary | ICD-10-CM | POA: Diagnosis not present

## 2019-06-11 ENCOUNTER — Other Ambulatory Visit: Payer: Self-pay | Admitting: Cardiology

## 2019-06-11 DIAGNOSIS — R93421 Abnormal radiologic findings on diagnostic imaging of right kidney: Secondary | ICD-10-CM

## 2019-06-13 ENCOUNTER — Ambulatory Visit: Payer: Medicare Other | Admitting: Internal Medicine

## 2019-06-15 NOTE — Progress Notes (Signed)
Where do you want patient to go and get ultrasound? Thank you

## 2019-06-18 ENCOUNTER — Other Ambulatory Visit: Payer: Self-pay | Admitting: Cardiology

## 2019-06-21 ENCOUNTER — Other Ambulatory Visit: Payer: Self-pay

## 2019-06-21 DIAGNOSIS — E876 Hypokalemia: Secondary | ICD-10-CM

## 2019-06-21 MED ORDER — POTASSIUM CHLORIDE CRYS ER 20 MEQ PO TBCR: 20.0000 meq | EXTENDED_RELEASE_TABLET | Freq: Every day | ORAL | 3 refills | Status: DC

## 2019-08-29 ENCOUNTER — Other Ambulatory Visit: Payer: Self-pay | Admitting: Cardiology

## 2019-09-13 ENCOUNTER — Encounter: Payer: Self-pay | Admitting: Internal Medicine

## 2019-09-13 ENCOUNTER — Ambulatory Visit (INDEPENDENT_AMBULATORY_CARE_PROVIDER_SITE_OTHER): Payer: Medicare Other | Admitting: Internal Medicine

## 2019-09-13 ENCOUNTER — Other Ambulatory Visit: Payer: Self-pay

## 2019-09-13 VITALS — BP 116/70 | HR 78 | Temp 98.6°F | Ht 75.0 in | Wt 262.2 lb

## 2019-09-13 DIAGNOSIS — G4733 Obstructive sleep apnea (adult) (pediatric): Secondary | ICD-10-CM

## 2019-09-13 DIAGNOSIS — N2889 Other specified disorders of kidney and ureter: Secondary | ICD-10-CM | POA: Diagnosis not present

## 2019-09-13 DIAGNOSIS — I119 Hypertensive heart disease without heart failure: Secondary | ICD-10-CM

## 2019-09-13 DIAGNOSIS — I110 Hypertensive heart disease with heart failure: Secondary | ICD-10-CM | POA: Insufficient documentation

## 2019-09-13 DIAGNOSIS — R7309 Other abnormal glucose: Secondary | ICD-10-CM | POA: Diagnosis not present

## 2019-09-13 DIAGNOSIS — E6609 Other obesity due to excess calories: Secondary | ICD-10-CM

## 2019-09-13 DIAGNOSIS — F17210 Nicotine dependence, cigarettes, uncomplicated: Secondary | ICD-10-CM

## 2019-09-13 DIAGNOSIS — Z9989 Dependence on other enabling machines and devices: Secondary | ICD-10-CM

## 2019-09-13 DIAGNOSIS — Z6832 Body mass index (BMI) 32.0-32.9, adult: Secondary | ICD-10-CM

## 2019-09-13 LAB — LIPID PANEL
Chol/HDL Ratio: 2.8 ratio (ref 0.0–5.0)
Cholesterol, Total: 125 mg/dL (ref 100–199)
HDL: 44 mg/dL (ref >39)
LDL Chol Calc (NIH): 70 mg/dL (ref 0–99)
Triglycerides: 50 mg/dL (ref 0–149)
VLDL Cholesterol Cal: 11 mg/dL (ref 5–40)

## 2019-09-13 LAB — CMP14+EGFR
ALT: 18 IU/L (ref 0–44)
AST: 18 IU/L (ref 0–40)
Albumin/Globulin Ratio: 1.2 (ref 1.2–2.2)
Albumin: 3.8 g/dL (ref 3.8–4.8)
Alkaline Phosphatase: 57 IU/L (ref 39–117)
BUN/Creatinine Ratio: 15 (ref 10–24)
BUN: 14 mg/dL (ref 8–27)
Bilirubin Total: 0.2 mg/dL (ref 0.0–1.2)
CO2: 24 mmol/L (ref 20–29)
Calcium: 9.8 mg/dL (ref 8.6–10.2)
Chloride: 101 mmol/L (ref 96–106)
Creatinine, Ser: 0.95 mg/dL (ref 0.76–1.27)
GFR calc Af Amer: 96 mL/min/{1.73_m2} (ref >59)
GFR calc non Af Amer: 83 mL/min/{1.73_m2} (ref >59)
Globulin, Total: 3.2 g/dL (ref 1.5–4.5)
Glucose: 96 mg/dL (ref 65–99)
Potassium: 3.8 mmol/L (ref 3.5–5.2)
Sodium: 140 mmol/L (ref 134–144)
Total Protein: 7 g/dL (ref 6.0–8.5)

## 2019-09-13 LAB — HEMOGLOBIN A1C
Est. average glucose Bld gHb Est-mCnc: 131 mg/dL
Hgb A1c MFr Bld: 6.2 % — ABNORMAL HIGH (ref 4.8–5.6)

## 2019-09-13 NOTE — Progress Notes (Signed)
This visit occurred during the SARS-CoV-2 public health emergency.  Safety protocols were in place, including screening questions prior to the visit, additional usage of staff PPE, and extensive cleaning of exam room while observing appropriate contact time as indicated for disinfecting solutions.  Subjective:     [name] , male    DOB: [birth year] , 69 y.o.   MRN: [id number]   Chief Complaint  Patient presents with  . Hypertension    HPI  Hypertension This is a chronic problem. The current episode started more than 1 year ago. The problem has been gradually improving since onset. The problem is controlled. Pertinent negatives include no blurred vision, chest pain, palpitations or shortness of breath. Risk factors for coronary artery disease include obesity and male gender. Past treatments include diuretics, calcium channel blockers and angiotensin blockers. The current treatment provides moderate improvement.     Past Medical History:  Diagnosis Date  . GERD (gastroesophageal reflux disease)   . H/O: CVA (cerebrovascular accident)   . Hypertension   . OSA (obstructive sleep apnea)      Family History  Problem Relation Age of Onset  . Cancer Mother   . Stroke Father   . Heart disease Father      Current Outpatient Medications:  .  aspirin 81 MG tablet, Take 81 mg by mouth daily., Disp: , Rfl:  .  EDARBYCLOR 40-25 MG TABS, TAKE (1) TABLET DAILY IN THE MORNING., Disp: 30 tablet, Rfl: 0 .  Omega-3 Fatty Acids (FISH OIL PO), Take by mouth. 1 daily, Disp: , Rfl:  .  potassium chloride SA (KLOR-CON) 20 MEQ tablet, Take 1 tablet (20 mEq total) by mouth daily., Disp: 90 tablet, Rfl: 3 .  spironolactone (ALDACTONE) 25 MG tablet, TAKE 1 TABLET BY MOUTH IN  THE MORNING, Disp: 90 tablet, Rfl: 3 .  verapamil (CALAN-SR) 240 MG CR tablet, TAKE 1 TABLET BY MOUTH  TWICE DAILY, Disp: 180 tablet, Rfl: 3   No Known Allergies   Review of Systems  Constitutional: Negative.    Eyes: Negative for blurred vision.  Respiratory: Negative for shortness of breath.   Cardiovascular: Negative.  Negative for chest pain and palpitations.  Gastrointestinal: Negative.   Neurological: Negative.   Psychiatric/Behavioral: Negative.      Today's Vitals   09/13/19 0852  BP: 116/70  Pulse: 78  Temp: 98.6 F (37 C)  TempSrc: Oral  Weight: 262 lb 3.2 oz (118.9 kg)  Height: '6\' 3"'  (1.905 m)   Body mass index is 32.77 kg/m.   Objective:  Physical Exam Vitals and nursing note reviewed.  Constitutional:      Appearance: Normal appearance.  Cardiovascular:     Rate and Rhythm: Normal rate and regular rhythm.     Heart sounds: Normal heart sounds.  Pulmonary:     Effort: Pulmonary effort is normal.     Breath sounds: Normal breath sounds.  Skin:    General: Skin is warm.  Neurological:     General: No focal deficit present.     Mental Status: He is alert.  Psychiatric:        Mood and Affect: Mood normal.         Assessment And Plan:     1. Hypertensive heart disease without heart failure  Chronic, well controlled.    He will continue with current meds. He is encouraged to avoid adding salt to her foods. I will check labs as listed below.   - CMP14+EGFR -  Lipid panel  2. Other abnormal glucose  HIS A1C HAS BEEN ELEVATED IN THE PAST. I WILL CHECK AN A1C, BMET TODAY. HE WAS ENCOURAGED TO AVOID SUGARY BEVERAGES AND PROCESSED FOODS INCLUDNG BREADS, RICE AND PASTA.  - Hemoglobin A1c  3. Renal mass  Seen on AAA screening as per Dr. Irven Shelling note.  I will refer him for renal ultrasound.   - US Renal; Future  4. Obstructive sleep apnea treated with continuous positive airway pressure (CPAP)  Chronic, he reports compliance with CPAP. He is encouraged to wear at least four hours per night.   5. Class 1 obesity due to excess calories with serious comorbidity and body mass index (BMI) of 32.0 to 32.9 in adult  Wt Readings from Last 3 Encounters:  09/13/19  262 lb 3.2 oz (118.9 kg)  05/08/19 272 lb (123.4 kg)  03/23/19 262 lb 3.2 oz (118.9 kg)    He was congratulated on his ten pound weight loss. He is encouraged to strive for BMI less than 30 to decrease cardiac risk. He is advised to aim for at least 30 minutes of exercise five days per week.   6. Cigarette nicotine dependence without complication  He was congratulated on cutting back on his cigarette use. He is not ready to quit at this time.   '  [name], MD    THE PATIENT IS ENCOURAGED TO PRACTICE SOCIAL DISTANCING DUE TO THE COVID-19 PANDEMIC.

## 2019-09-13 NOTE — Patient Instructions (Signed)

## 2019-09-20 ENCOUNTER — Ambulatory Visit
Admission: RE | Admit: 2019-09-20 | Discharge: 2019-09-20 | Disposition: A | Discharge status: Home / Self Care | Payer: Medicare Other | Source: Ambulatory Visit | Attending: Internal Medicine | Admitting: Internal Medicine

## 2019-09-20 DIAGNOSIS — N281 Cyst of kidney, acquired: Secondary | ICD-10-CM | POA: Diagnosis not present

## 2019-09-20 DIAGNOSIS — N2889 Other specified disorders of kidney and ureter: Secondary | ICD-10-CM | POA: Diagnosis not present

## 2019-09-22 ENCOUNTER — Telehealth: Payer: Self-pay

## 2019-09-22 NOTE — Telephone Encounter (Signed)
I left the pt a message that I was returning his phone call.

## 2019-09-22 NOTE — Telephone Encounter (Signed)
-----   Message from Glendale Chard, MD sent at 09/21/2019  9:59 PM EST ----- Here are your lab results:  There is a cyst of lower pole of left kidney on kidney ultrasound. No acute findings. There is enlargement of the prostate. No other abnormalities noted.   Please let me know if you have any questions or concerns. Stay safe!   Sincerely,    Robyn N. [facility]aird [facility], MD

## 2019-09-28 ENCOUNTER — Telehealth: Payer: Self-pay

## 2019-09-28 ENCOUNTER — Other Ambulatory Visit: Payer: Self-pay

## 2019-09-28 DIAGNOSIS — N400 Enlarged prostate without lower urinary tract symptoms: Secondary | ICD-10-CM

## 2019-09-28 DIAGNOSIS — N281 Cyst of kidney, acquired: Secondary | ICD-10-CM

## 2019-10-03 ENCOUNTER — Other Ambulatory Visit: Payer: Self-pay | Admitting: Cardiology

## 2019-10-19 ENCOUNTER — Other Ambulatory Visit: Payer: Self-pay

## 2019-10-19 NOTE — Patient Outreach (Signed)
[facility]) Care Management  10/19/2019  Terry Andrews [birth year] [id number]   Medication Adherence call to [name] Telephone call to Patient regarding Medication Adherence unable to reach patient,spoke with patient wife she will have patient call back. [name] is showing past due on Edarbyclor 40/25 mg under [place].   [facility] Direct Dial [phone]  Fax [phone] [email or web address]

## 2019-11-07 DIAGNOSIS — N281 Cyst of kidney, acquired: Secondary | ICD-10-CM | POA: Diagnosis not present

## 2019-11-26 ENCOUNTER — Other Ambulatory Visit: Payer: Self-pay | Admitting: Cardiology

## 2019-12-06 DIAGNOSIS — Z86010 Personal history of colonic polyps: Secondary | ICD-10-CM | POA: Diagnosis not present

## 2019-12-06 DIAGNOSIS — I10 Essential (primary) hypertension: Secondary | ICD-10-CM | POA: Diagnosis not present

## 2019-12-06 DIAGNOSIS — K219 Gastro-esophageal reflux disease without esophagitis: Secondary | ICD-10-CM | POA: Diagnosis not present

## 2019-12-21 DIAGNOSIS — Z86010 Personal history of colonic polyps: Secondary | ICD-10-CM | POA: Diagnosis not present

## 2019-12-21 DIAGNOSIS — K635 Polyp of colon: Secondary | ICD-10-CM | POA: Diagnosis not present

## 2019-12-21 DIAGNOSIS — K5730 Diverticulosis of large intestine without perforation or abscess without bleeding: Secondary | ICD-10-CM | POA: Diagnosis not present

## 2019-12-21 HISTORY — PX: COLONOSCOPY: SHX174

## 2019-12-21 LAB — HM COLONOSCOPY

## 2019-12-29 DIAGNOSIS — G4733 Obstructive sleep apnea (adult) (pediatric): Secondary | ICD-10-CM | POA: Diagnosis not present

## 2020-01-11 ENCOUNTER — Ambulatory Visit: Payer: Medicare Other | Admitting: Internal Medicine

## 2020-01-11 ENCOUNTER — Other Ambulatory Visit: Payer: Self-pay

## 2020-01-11 ENCOUNTER — Ambulatory Visit (INDEPENDENT_AMBULATORY_CARE_PROVIDER_SITE_OTHER): Payer: Medicare Other

## 2020-01-11 VITALS — BP 110/62 | HR 83 | Temp 98.4°F | Ht 71.2 in | Wt 261.2 lb

## 2020-01-11 DIAGNOSIS — Z0000 Encounter for general adult medical examination without abnormal findings: Secondary | ICD-10-CM

## 2020-01-11 NOTE — Progress Notes (Signed)
This visit occurred during the SARS-CoV-2 public health emergency.  Safety protocols were in place, including screening questions prior to the visit, additional usage of staff PPE, and extensive cleaning of exam room while observing appropriate contact time as indicated for disinfecting solutions.  Subjective:   Terry Andrews is a 69 y.o. male who presents for Medicare Annual/Subsequent preventive examination.  Review of Systems:  n/a Cardiac Risk Factors include: advanced age (>47men, >19 women);hypertension;male gender;obesity (BMI >30kg/m2);sedentary lifestyle;smoking/ tobacco exposure     Objective:    Vitals: BP 110/62 (BP Location: Left Arm, Patient Position: Sitting, Cuff Size: Normal)   Pulse 83   Temp 98.4 F (36.9 C) (Oral)   Ht 5' 11.2" (1.808 m)   Wt 261 lb 3.2 oz (118.5 kg)   SpO2 96%   BMI 36.23 kg/m   Body mass index is 36.23 kg/m.  Advanced Directives 01/11/2020 01/10/2019  Does Patient Have a Medical Advance Directive? Yes Yes  Type of Paramedic of [place];Living will [place] in Chart? Yes - validated most recent copy scanned in chart (See row information) -    Tobacco Social History   Tobacco Use  Smoking Status Current Every Day Smoker  . Packs/day: 0.25  . Years: 30.00  . Pack years: 7.50  . Types: Cigarettes  Smokeless Tobacco Never Used  Tobacco Comment   he has cut back numbers cigs/smoked     Ready to quit: No Counseling given: Yes Comment: he has cut back numbers cigs/smoked   Clinical Intake:  Pre-visit preparation completed: Yes  Pain : No/denies pain     Nutritional Status: BMI > 30  Obese Nutritional Risks: None Diabetes: No  How often do you need to have someone help you when you read instructions, pamphlets, or other written materials from your doctor or pharmacy?: 1 - Never What is the last grade level you completed in school?:  college  Interpreter Needed?: No  Information entered by :: NAllen LPN  Past Medical History:  Diagnosis Date  . GERD (gastroesophageal reflux disease)   . H/O: CVA (cerebrovascular accident)   . Hypertension   . OSA (obstructive sleep apnea)    History reviewed. No pertinent surgical history. Family History  Problem Relation Age of Onset  . Cancer Mother   . Stroke Father   . Heart disease Father    Social History   Socioeconomic History  . Marital status: Single    Spouse name: Not on file  . Number of children: 2  . Years of education: College  . Highest education level: Not on file  Occupational History  . Occupation: retired  Tobacco Use  . Smoking status: Current Every Day Smoker    Packs/day: 0.25    Years: 30.00    Pack years: 7.50    Types: Cigarettes  . Smokeless tobacco: Never Used  . Tobacco comment: he has cut back numbers cigs/smoked  Vaping Use  . Vaping Use: Never used  Substance and Sexual Activity  . Alcohol use: Yes    Alcohol/week: 0.0 standard drinks    Comment: Occasional   . Drug use: Yes    Types: Marijuana    Comment: every once in a while  . Sexual activity: Yes  Other Topics Concern  . Not on file  Social History Narrative   Some caffeine use    Social Determinants of Health   Financial Resource Strain: Low Risk   .  Difficulty of Paying Living Expenses: Not hard at all  Food Insecurity: No Food Insecurity  . Worried About Charity fundraiser in the Last Year: Never true  . Ran Out of Food in the Last Year: Never true  Transportation Needs: No Transportation Needs  . Lack of Transportation (Medical): No  . Lack of Transportation (Non-Medical): No  Physical Activity: Inactive  . Days of Exercise per Week: 0 days  . Minutes of Exercise per Session: 0 min  Stress: No Stress Concern Present  . Feeling of Stress : Not at all  Social Connections:   . Frequency of Communication with Friends and Family:   . Frequency of Social  Gatherings with Friends and Family:   . Attends Religious Services:   . Active Member of Clubs or Organizations:   . Attends Archivist Meetings:   Marland Kitchen Marital Status:     Outpatient Encounter Medications as of 01/11/2020  Medication Sig  . aspirin 81 MG tablet Take 81 mg by mouth daily.  . Cholecalciferol (VITAMIN D) 125 MCG (5000 UT) CAPS Take 1 capsule by mouth daily.  Marland Kitchen EDARBYCLOR 40-25 MG TABS TAKE (1) TABLET DAILY IN THE MORNING.  . Omega-3 Fatty Acids (FISH OIL PO) Take by mouth. 1 daily  . potassium chloride SA (KLOR-CON) 20 MEQ tablet Take 1 tablet (20 mEq total) by mouth daily.  Marland Kitchen spironolactone (ALDACTONE) 25 MG tablet TAKE 1 TABLET BY MOUTH IN  THE MORNING  . verapamil (CALAN-SR) 240 MG CR tablet TAKE 1 TABLET BY MOUTH  TWICE DAILY   No facility-administered encounter medications on file as of 01/11/2020.    Activities of Daily Living In your present state of health, do you have any difficulty performing the following activities: 01/11/2020 03/08/2019  Hearing? N N  Vision? N N  Difficulty concentrating or making decisions? N N  Walking or climbing stairs? N N  Dressing or bathing? N N  Doing errands, shopping? N N  Preparing Food and eating ? N -  Using the Toilet? N -  In the past six months, have you accidently leaked urine? N -  Do you have problems with loss of bowel control? N -  Managing your Medications? N -  Managing your Finances? N -  Housekeeping or managing your Housekeeping? N -  Some recent data might be hidden    Patient Care Team: Terry Chard, MD as PCP - General (Internal Medicine) Adrian Prows, MD as PCP - Cardiology (Cardiology)   Assessment:   This is a routine wellness examination for Terry Andrews.  Exercise Activities and Dietary recommendations Current Exercise Habits: The patient does not participate in regular exercise at present  Goals    . Patient Stated     Stay alive     . Patient Stated     01/11/2020, stay alive        Fall Risk Fall Risk  01/11/2020 03/08/2019 01/10/2019 12/13/2018 08/11/2018  Falls in the past year? 0 0 0 0 0  Risk for fall due to : - - Medication side effect - -  Follow up - - Falls prevention discussed - -   Is the patient's home free of loose throw rugs in walkways, pet beds, electrical cords, etc?   yes      Grab bars in the bathroom? no      Handrails on the stairs?   yes      Adequate lighting?   yes  Timed Get Up and Go Performed:  n/a  Depression Screen PHQ 2/9 Scores 01/11/2020 03/08/2019 01/10/2019 12/13/2018  PHQ - 2 Score 0 0 0 0  PHQ- 9 Score 0 0 0 -    Cognitive Function     6CIT Screen 01/11/2020 01/10/2019  What Year? 0 points 0 points  What month? 0 points 0 points  What time? 0 points 3 points  Count back from 20 0 points 0 points  Months in reverse 0 points 0 points  Repeat phrase 4 points 0 points  Total Score 4 3    Immunization History  Administered Date(s) Administered  . PFIZER SARS-COV-2 Vaccination 08/22/2019, 09/12/2019  . Tdap 02/18/2017    Qualifies for Shingles Vaccine? yes  Screening Tests Health Maintenance  Topic Date Due  . PNA vac Low Risk Adult (1 of 2 - PCV13) 01/10/2021 (Originally 06/01/2018)  . INFLUENZA VACCINE  03/03/2020  . TETANUS/TDAP  02/19/2027  . COLONOSCOPY  12/20/2029  . COVID-19 Vaccine  Completed  . Hepatitis C Screening  Completed   Cancer Screenings: Lung: Low Dose CT Chest recommended if Age 57-80 years, 30 pack-year currently smoking OR have quit w/in 15years. Patient does not qualify. Colorectal: up to date  Additional Screenings:  Hepatitis C Screening:08/11/2018      Plan:    Patient just wants to stay alive.  I have personally reviewed and noted the following in the patient's chart:   . Medical and social history . Use of alcohol, tobacco or illicit drugs  . Current medications and supplements . Functional ability and status . Nutritional status . Physical activity . Advanced directives . List of  other physicians . Hospitalizations, surgeries, and ER visits in previous 12 months . Vitals . Screenings to include cognitive, depression, and falls . Referrals and appointments  In addition, I have reviewed and discussed with patient certain preventive protocols, quality metrics, and best practice recommendations. A written personalized care plan for preventive services as well as general preventive health recommendations were provided to patient.     Kellie Simmering, LPN  [id number]

## 2020-01-11 NOTE — Patient Instructions (Signed)
Terry Andrews , Thank you for taking time to come for your Medicare Wellness Visit. I appreciate your ongoing commitment to your health goals. Please review the following plan we discussed and let me know if I can assist you in the future.   Screening recommendations/referrals: Colonoscopy: 12/2019 Recommended yearly ophthalmology/optometry visit for glaucoma screening and checkup Recommended yearly dental visit for hygiene and checkup  Vaccinations: Influenza vaccine: declines Pneumococcal vaccine: declines Tdap vaccine: 01/2017 Shingles vaccine: discussed    Advanced directives: copy in chart  Conditions/risks identified: obesity  Next appointment: 03/14/2020 at 9:30  Preventive Care 28 Years and Older, Male Preventive care refers to lifestyle choices and visits with your health care provider that can promote health and wellness. What does preventive care include?  A yearly physical exam. This is also called an annual well check.  Dental exams once or twice a year.  Routine eye exams. Ask your health care provider how often you should have your eyes checked.  Personal lifestyle choices, including:  Daily care of your teeth and gums.  Regular physical activity.  Eating a healthy diet.  Avoiding tobacco and drug use.  Limiting alcohol use.  Practicing safe sex.  Taking low doses of aspirin every day.  Taking vitamin and mineral supplements as recommended by your health care provider. What happens during an annual well check? The services and screenings done by your health care provider during your annual well check will depend on your age, overall health, lifestyle risk factors, and family history of disease. Counseling  Your health care provider may ask you questions about your:  Alcohol use.  Tobacco use.  Drug use.  Emotional well-being.  Home and relationship well-being.  Sexual activity.  Eating habits.  History of falls.  Memory and ability to  understand (cognition).  Work and work Statistician. Screening  You may have the following tests or measurements:  Height, weight, and BMI.  Blood pressure.  Lipid and cholesterol levels. These may be checked every 5 years, or more frequently if you are over 10 years old.  Skin check.  Lung cancer screening. You may have this screening every year starting at age 49 if you have a 30-pack-year history of smoking and currently smoke or have quit within the past 15 years.  Fecal occult blood test (FOBT) of the stool. You may have this test every year starting at age 70.  Flexible sigmoidoscopy or colonoscopy. You may have a sigmoidoscopy every 5 years or a colonoscopy every 10 years starting at age 21.  Prostate cancer screening. Recommendations will vary depending on your family history and other risks.  Hepatitis C blood test.  Hepatitis B blood test.  Sexually transmitted disease (STD) testing.  Diabetes screening. This is done by checking your blood sugar (glucose) after you have not eaten for a while (fasting). You may have this done every 1-3 years.  Abdominal aortic aneurysm (AAA) screening. You may need this if you are a current or former smoker.  Osteoporosis. You may be screened starting at age 25 if you are at high risk. Talk with your health care provider about your test results, treatment options, and if necessary, the need for more tests. Vaccines  Your health care provider may recommend certain vaccines, such as:  Influenza vaccine. This is recommended every year.  Tetanus, diphtheria, and acellular pertussis (Tdap, Td) vaccine. You may need a Td booster every 10 years.  Zoster vaccine. You may need this after age 10.  Pneumococcal 13-valent conjugate (  PCV13) vaccine. One dose is recommended after age 36.  Pneumococcal polysaccharide (PPSV23) vaccine. One dose is recommended after age 39. Talk to your health care provider about which screenings and vaccines you  need and how often you need them. This information is not intended to replace advice given to you by your health care provider. Make sure you discuss any questions you have with your health care provider. Document Released: 08/16/2015 Document Revised: 04/08/2016 Document Reviewed: 05/21/2015 Elsevier Interactive Patient Education  2017 [employer] Prevention in the Home Falls can cause injuries. They can happen to people of all ages. There are many things you can do to make your home safe and to help prevent falls. What can I do on the outside of my home?  Regularly fix the edges of walkways and driveways and fix any cracks.  Remove anything that might make you trip as you walk through a door, such as a raised step or threshold.  Trim any bushes or trees on the path to your home.  Use bright outdoor lighting.  Clear any walking paths of anything that might make someone trip, such as rocks or tools.  Regularly check to see if handrails are loose or broken. Make sure that both sides of any steps have handrails.  Any raised decks and porches should have guardrails on the edges.  Have any leaves, snow, or ice cleared regularly.  Use sand or salt on walking paths during winter.  Clean up any spills in your garage right away. This includes oil or grease spills. What can I do in the bathroom?  Use night lights.  Install grab bars by the toilet and in the tub and shower. Do not use towel bars as grab bars.  Use non-skid mats or decals in the tub or shower.  If you need to sit down in the shower, use a plastic, non-slip stool.  Keep the floor dry. Clean up any water that spills on the floor as soon as it happens.  Remove soap buildup in the tub or shower regularly.  Attach bath mats securely with double-sided non-slip rug tape.  Do not have throw rugs and other things on the floor that can make you trip. What can I do in the bedroom?  Use night lights.  Make sure  that you have a light by your bed that is easy to reach.  Do not use any sheets or blankets that are too big for your bed. They should not hang down onto the floor.  Have a firm chair that has side arms. You can use this for support while you get dressed.  Do not have throw rugs and other things on the floor that can make you trip. What can I do in the kitchen?  Clean up any spills right away.  Avoid walking on wet floors.  Keep items that you use a lot in easy-to-reach places.  If you need to reach something above you, use a strong step stool that has a grab bar.  Keep electrical cords out of the way.  Do not use floor polish or wax that makes floors slippery. If you must use wax, use non-skid floor wax.  Do not have throw rugs and other things on the floor that can make you trip. What can I do with my stairs?  Do not leave any items on the stairs.  Make sure that there are handrails on both sides of the stairs and use them. Fix handrails that  are broken or loose. Make sure that handrails are as long as the stairways.  Check any carpeting to make sure that it is firmly attached to the stairs. Fix any carpet that is loose or worn.  Avoid having throw rugs at the top or bottom of the stairs. If you do have throw rugs, attach them to the floor with carpet tape.  Make sure that you have a light switch at the top of the stairs and the bottom of the stairs. If you do not have them, ask someone to add them for you. What else can I do to help prevent falls?  Wear shoes that:  Do not have high heels.  Have rubber bottoms.  Are comfortable and fit you well.  Are closed at the toe. Do not wear sandals.  If you use a stepladder:  Make sure that it is fully opened. Do not climb a closed stepladder.  Make sure that both sides of the stepladder are locked into place.  Ask someone to hold it for you, if possible.  Clearly mark and make sure that you can see:  Any grab bars or  handrails.  First and last steps.  Where the edge of each step is.  Use tools that help you move around (mobility aids) if they are needed. These include:  Canes.  Walkers.  Scooters.  Crutches.  Turn on the lights when you go into a dark area. Replace any light bulbs as soon as they burn out.  Set up your furniture so you have a clear path. Avoid moving your furniture around.  If any of your floors are uneven, fix them.  If there are any pets around you, be aware of where they are.  Review your medicines with your doctor. Some medicines can make you feel dizzy. This can increase your chance of falling. Ask your doctor what other things that you can do to help prevent falls. This information is not intended to replace advice given to you by your health care provider. Make sure you discuss any questions you have with your health care provider. Document Released: 05/16/2009 Document Revised: 12/26/2015 Document Reviewed: 08/24/2014 Elsevier Interactive Patient Education  2017 [employer].

## 2020-01-15 ENCOUNTER — Encounter: Payer: Self-pay | Admitting: Internal Medicine

## 2020-03-14 ENCOUNTER — Encounter: Payer: Medicare Other | Admitting: Internal Medicine

## 2020-03-25 ENCOUNTER — Ambulatory Visit: Payer: Medicare Other | Admitting: Cardiology

## 2020-03-25 ENCOUNTER — Other Ambulatory Visit: Payer: Self-pay

## 2020-03-25 ENCOUNTER — Encounter: Payer: Self-pay | Admitting: Internal Medicine

## 2020-03-25 ENCOUNTER — Encounter: Payer: Self-pay | Admitting: Cardiology

## 2020-03-25 VITALS — BP 116/67 | HR 76 | Resp 16 | Ht 71.0 in | Wt 254.4 lb

## 2020-03-25 DIAGNOSIS — Z9989 Dependence on other enabling machines and devices: Secondary | ICD-10-CM | POA: Diagnosis not present

## 2020-03-25 DIAGNOSIS — G4733 Obstructive sleep apnea (adult) (pediatric): Secondary | ICD-10-CM | POA: Diagnosis not present

## 2020-03-25 DIAGNOSIS — I10 Essential (primary) hypertension: Secondary | ICD-10-CM | POA: Diagnosis not present

## 2020-03-25 DIAGNOSIS — I119 Hypertensive heart disease without heart failure: Secondary | ICD-10-CM

## 2020-03-25 NOTE — Progress Notes (Signed)
Primary Physician/Referring:  Glendale Chard, MD  [name], male    DOB: [birth year], 69 y.o.   MRN: [id number]   Chief Complaint  Patient presents with  . Hypertension  . Follow-up    1 year   HPI:    Terry Andrews  is a 69 y.o. African-American male with hypertension, history of remote stroke in 2003 with no residual defects probably related to hypertension, tobacco use disorder, obstructive sleep apnea on CPAP presents for annual visit. Presently he is doing well and denies any chest pain, shortness of breath, PND or orthopnea.   He denies any syncope, neurological weaknesses, tingling or numbness in the extremities. He is tolerating the medications without any side effects. He continues to smoke and does not wish to quit smoking. He is using CPAP nightly.  Past Medical History:  Diagnosis Date  . GERD (gastroesophageal reflux disease)   . H/O: CVA (cerebrovascular accident)   . Hypertension   . OSA (obstructive sleep apnea)    History reviewed. No pertinent surgical history. Family History  Problem Relation Age of Onset  . Cancer Mother   . Stroke Father   . Heart disease Father     Social History   Tobacco Use  . Smoking status: Current Every Day Smoker    Packs/day: 0.25    Years: 30.00    Pack years: 7.50    Types: Cigarettes  . Smokeless tobacco: Never Used  . Tobacco comment: he has cut back numbers cigs/smoked  Substance Use Topics  . Alcohol use: Yes    Alcohol/week: 0.0 standard drinks    Comment: Occasional    Marital Status: Single  ROS  Review of Systems  Cardiovascular: Negative for chest pain, dyspnea on exertion and leg swelling.  Gastrointestinal: Negative for melena.   Objective  Resp. rate 16, height 5\' 11"  (1.803 m), weight 254 lb 6.4 oz (115.4 kg).  Vitals with BMI 03/25/2020 01/11/2020 09/13/2019  Height 5\' 11"  5' 11.2" 6\' 3"   Weight 254 lbs 6 oz 261 lbs 3 oz 262 lbs 3 oz  BMI 35.5 40.98 11.91  Systolic - 478 295    Diastolic - 62 70  Pulse - 83 78     Physical Exam HENT:     Head: Atraumatic.  Cardiovascular:     Rate and Rhythm: Normal rate and regular rhythm.     Pulses: Intact distal pulses.     Heart sounds: S1 normal and S2 normal. Murmur heard.  Systolic murmur is present with a grade of 2/6 at the upper right sternal border.  No gallop.      Comments: No edema. No JVD.  Pulmonary:     Effort: Pulmonary effort is normal.     Breath sounds: Normal breath sounds.  Abdominal:     General: Bowel sounds are normal.     Palpations: Abdomen is soft.    Laboratory examination:   Recent Labs    09/13/19 0939  NA 140  K 3.8  CL 101  CO2 24  GLUCOSE 96  BUN 14  CREATININE 0.95  CALCIUM 9.8  GFRNONAA 83  GFRAA 96   CrCl cannot be calculated (Patient's most recent lab result is older than the maximum 21 days allowed.).  CMP Latest Ref Rng & Units 09/13/2019 03/08/2019 12/13/2018  Glucose 65 - 99 mg/dL 96 102(H) 155(H)  BUN 8 - 27 mg/dL 14 11 13   Creatinine 0.76 - 1.27 mg/dL 0.95 1.01 0.86  Sodium 134 -  144 mmol/L 140 141 139  Potassium 3.5 - 5.2 mmol/L 3.8 3.4(L) 3.3(L)  Chloride 96 - 106 mmol/L 101 99 101  CO2 20 - 29 mmol/L 24 27 25   Calcium 8.6 - 10.2 mg/dL 9.8 10.0 9.5  Total Protein 6.0 - 8.5 g/dL 7.0 6.7 -  Total Bilirubin 0.0 - 1.2 mg/dL 0.2 <0.2 -  Alkaline Phos 39 - 117 IU/L 57 50 -  AST 0 - 40 IU/L 18 16 -  ALT 0 - 44 IU/L 18 15 -   CBC Latest Ref Rng & Units 03/08/2019  WBC 3.4 - 10.8 x10E3/uL 5.8  Hemoglobin 13.0 - 17.7 g/dL 14.0  Hematocrit 37.5 - 51.0 % 41.6  Platelets 150 - 450 x10E3/uL 259    Lipid Panel Recent Labs    09/13/19 0939  CHOL 125  TRIG 50  LDLCALC 70  HDL 44  CHOLHDL 2.8    HEMOGLOBIN A1C Lab Results  Component Value Date   HGBA1C 6.2 (H) 09/13/2019   TSH No results for input(s): TSH in the last 8760 hours.  Medications and allergies  No Known Allergies   Outpatient Medications Prior to Visit  Medication Sig Dispense Refill  .  aspirin 81 MG tablet Take 81 mg by mouth daily.    . Cholecalciferol (VITAMIN D) 125 MCG (5000 UT) CAPS Take 1 capsule by mouth daily.    Marland Kitchen EDARBYCLOR 40-25 MG TABS TAKE (1) TABLET DAILY IN THE MORNING. 30 tablet 3  . Omega-3 Fatty Acids (FISH OIL PO) Take by mouth. 1 daily    . spironolactone (ALDACTONE) 25 MG tablet TAKE 1 TABLET BY MOUTH IN  THE MORNING 90 tablet 3  . verapamil (CALAN-SR) 240 MG CR tablet TAKE 1 TABLET BY MOUTH  TWICE DAILY 180 tablet 3  . potassium chloride SA (KLOR-CON) 20 MEQ tablet Take 1 tablet (20 mEq total) by mouth daily. 90 tablet 3   No facility-administered medications prior to visit.     Radiology:   No results found.  Cardiac Studies:   Exercise sestamibi 10/16/11: Ex 5 min, 7.3 METs. Small ant infarct vs attenuation. EF 45% Dil LV. Low risk stress test. Patient asymptomatic, contineu medical therapy.   Carotid duplex 04/13/11: Mild calcific plaque left carotid artery. No stenosis   Renal duplex 04/13/11: Normal renal duplex scan. Comp to prev, right renal A. do no suggest <60% stenosis.  Echocardiogram  04/15/2017: Left ventricle cavity is normal in size. Mild concentric hypertrophy of the left ventricle. Low normal LV systolic function with global hypokinesis. Visual EF is 50-55%. Doppler evidence of grade I (impaired) diastolic dysfunction, elevated LAP. Mild tricuspid regurgitation. No evidence of pulmonary hypertension. Impaired to the study done on 04/16/2011, EF previously was normal.  Abdominal Aortic Duplex 06/07/2019:  Normal abdominal aorta. No evidence of aneurysm.  Incidental 1.3x1.6 heterogeneous mass right kidney apex noted, consider  dedicated US of the kidneys. See attached image. (will be ordered)  US abdomen 09/21/2019: Simple appearing cyst off the lower pole of the left kidney. No acute findings.  No hydronephrosis. Prostate enlargement.  EKG  EKG 03/25/2020: Normal sinus rhythm at rate of 74 bpm, left atrial enlargement,  left axis deviation, poor R wave progression, cannot exclude anteroseptal infarct old.  IVCD, borderline criteria for LVH.  Nonspecific T abnormality.  EKG 03/10/2019: From Dr. Baird Cancer reviewed.  No significant change from  EKG 03/24/2018: Normal sinus rhythm at rate of 91 bpm, left atrial enlargement, poor R-wave progression, cannot exclude anterior infarct old. 2 mm  ST elevation V2 through V4, consider acute injury pattern. No change from 03/18/2017  Assessment     ICD-10-CM   1. Hypertensive heart disease without heart failure  I11.9 EKG 12-Lead  2. Essential hypertension  I10   3. Obstructive sleep apnea treated with continuous positive airway pressure (CPAP)  G47.33    Z99.89      Medications Discontinued During This Encounter  Medication Reason  . potassium chloride SA (KLOR-CON) 20 MEQ tablet Patient Preference    No orders of the defined types were placed in this encounter.  Recommendations:   Terry Andrews is a 69 y.o. African-American male with hypertension, history of remote stroke in 2003 with no residual defects probably related to hypertension, tobacco use disorder, obstructive sleep apnea on CPAP presents for annual visit. He is using CPAP nightly.  Blood pressures well controlled, I reviewed his external labs, patient is presently taking potassium supplements and potassium level has been normal.  However she would like to come off of the potassium tablets, advised him to recheck BMP in about a month after he is off of potassium supplements.  Suspect he probably will need to be on indefinite potassium in view of patient being on Edarbichlor.  No change in physical exam, no change in murmur.  EKG reveals IVCD, previously noted ST elevation from LVH is not present however I suspect this is probably routine progression. OV in 1 year.   Adrian Prows, MD, [facility] 03/25/2020, 8:55 AM Office: [phone]

## 2020-03-26 ENCOUNTER — Other Ambulatory Visit: Payer: Self-pay

## 2020-03-26 ENCOUNTER — Encounter: Payer: Self-pay | Admitting: Internal Medicine

## 2020-03-26 ENCOUNTER — Ambulatory Visit (INDEPENDENT_AMBULATORY_CARE_PROVIDER_SITE_OTHER): Payer: Medicare Other | Admitting: Internal Medicine

## 2020-03-26 VITALS — BP 140/72 | HR 89 | Temp 97.1°F | Ht 71.0 in | Wt 251.2 lb

## 2020-03-26 DIAGNOSIS — R7309 Other abnormal glucose: Secondary | ICD-10-CM

## 2020-03-26 DIAGNOSIS — E7800 Pure hypercholesterolemia, unspecified: Secondary | ICD-10-CM

## 2020-03-26 DIAGNOSIS — Z0000 Encounter for general adult medical examination without abnormal findings: Secondary | ICD-10-CM

## 2020-03-26 DIAGNOSIS — I119 Hypertensive heart disease without heart failure: Secondary | ICD-10-CM | POA: Diagnosis not present

## 2020-03-26 DIAGNOSIS — Z6835 Body mass index (BMI) 35.0-35.9, adult: Secondary | ICD-10-CM

## 2020-03-26 DIAGNOSIS — E559 Vitamin D deficiency, unspecified: Secondary | ICD-10-CM | POA: Diagnosis not present

## 2020-03-26 LAB — POCT UA - MICROALBUMIN
Creatinine, POC: 300 mg/dL
Microalbumin Ur, POC: 80 mg/L

## 2020-03-26 LAB — POCT URINALYSIS DIPSTICK
Glucose, UA: NEGATIVE
Ketones, UA: NEGATIVE
Leukocytes, UA: NEGATIVE
Nitrite, UA: NEGATIVE
Protein, UA: POSITIVE — AB
Spec Grav, UA: >=1.03 — AB (ref 1.010–1.025)
Urobilinogen, UA: 1 E.U./dL
pH, UA: 5.5 (ref 5.0–8.0)

## 2020-03-26 NOTE — Progress Notes (Signed)
I,Tianna Badgett,acting as a Education administrator for [name], MD.,have documented all relevant documentation on the behalf of [name], MD,as directed by  [name], MD while in the presence of [name], MD.  This visit occurred during the SARS-CoV-2 public health emergency.  Safety protocols were in place, including screening questions prior to the visit, additional usage of staff PPE, and extensive cleaning of exam room while observing appropriate contact time as indicated for disinfecting solutions.  Subjective:     [name] , male    DOB: 01/01/1951 , 69 y.o.   MRN: [id number]   Chief Complaint  Patient presents with  . Annual Exam  . Hypertension    HPI  He is here today for a full physical examination.  He reports compliance with meds. He denies having any specific concerns or complaints at this time.   Hypertension This is a chronic problem. The current episode started more than 1 year ago. The problem has been gradually improving since onset. The problem is controlled. Pertinent negatives include no blurred vision, chest pain, palpitations or shortness of breath. Risk factors for coronary artery disease include obesity, sedentary lifestyle, smoking/tobacco exposure and male gender. Past treatments include diuretics and angiotensin blockers. Compliance problems include exercise.      Past Medical History:  Diagnosis Date  . GERD (gastroesophageal reflux disease)   . H/O: CVA (cerebrovascular accident)   . Hypertension   . OSA (obstructive sleep apnea)      Family History  Problem Relation Age of Onset  . Cancer Mother   . Stroke Father   . Heart disease Father      Current Outpatient Medications:  .  aspirin 81 MG tablet, Take 81 mg by mouth daily., Disp: , Rfl:  .  Cholecalciferol (VITAMIN D) 125 MCG (5000 UT) CAPS, Take 1 capsule by mouth daily., Disp: , Rfl:  .  EDARBYCLOR 40-25 MG TABS, TAKE (1) TABLET DAILY IN THE MORNING., Disp: 30  tablet, Rfl: 3 .  Omega-3 Fatty Acids (FISH OIL PO), Take by mouth. 1 daily, Disp: , Rfl:  .  spironolactone (ALDACTONE) 25 MG tablet, TAKE 1 TABLET BY MOUTH IN  THE MORNING, Disp: 90 tablet, Rfl: 3 .  verapamil (CALAN-SR) 240 MG CR tablet, TAKE 1 TABLET BY MOUTH  TWICE DAILY, Disp: 180 tablet, Rfl: 3   No Known Allergies   Men's preventive visit. Patient Health Questionnaire (PHQ-2) is    Clinical Support from 01/11/2020 in [facility]  PHQ-2 Total Score 0    . Patient is on a healthy, low sodium diet. Marital status: Single. Relevant history for alcohol use is:  Social History   Substance and Sexual Activity  Alcohol Use Yes  . Alcohol/week: 0.0 standard drinks   Comment: Occasional   . Relevant history for tobacco use is:  Social History   Tobacco Use  Smoking Status Current Every Day Smoker  . Packs/day: 0.25  . Years: 30.00  . Pack years: 7.50  . Types: Cigarettes  Smokeless Tobacco Never Used  Tobacco Comment   he has cut back numbers cigs/smoked  .   Review of Systems  Constitutional: Negative.   HENT: Negative.   Eyes: Negative.  Negative for blurred vision.  Respiratory: Negative.  Negative for shortness of breath.   Cardiovascular: Negative.  Negative for chest pain and palpitations.  Gastrointestinal: Negative.   Endocrine: Negative.   Genitourinary: Negative.   Musculoskeletal: Negative.   Skin:  Negative.   Allergic/Immunologic: Negative.   Neurological: Negative.   Hematological: Negative.   Psychiatric/Behavioral: Negative.      Today's Vitals   03/26/20 1510  BP: 140/72  Pulse: 89  Temp: (!) 97.1 F (36.2 C)  TempSrc: Oral  Weight: 251 lb 3.2 oz (113.9 kg)  Height: '5\' 11"'  (1.803 m)   Body mass index is 35.04 kg/m.   Objective:  Physical Exam Vitals and nursing note reviewed.  Constitutional:      Appearance: Normal appearance. He is obese.  HENT:     Head: Normocephalic and atraumatic.     Right Ear: Tympanic  membrane, ear canal and external ear normal.     Left Ear: Tympanic membrane, ear canal and external ear normal.     Nose:     Comments: Deferred, masked    Mouth/Throat:     Comments: Deferred, masked Eyes:     Extraocular Movements: Extraocular movements intact.     Conjunctiva/sclera: Conjunctivae normal.     Pupils: Pupils are equal, round, and reactive to light.  Cardiovascular:     Rate and Rhythm: Normal rate and regular rhythm.     Pulses: Normal pulses.     Heart sounds: Murmur heard.   Pulmonary:     Effort: Pulmonary effort is normal.     Breath sounds: Normal breath sounds.  Chest:     Breasts:        Right: Normal. No swelling, bleeding, inverted nipple, mass or nipple discharge.        Left: Normal. No swelling, bleeding, inverted nipple, mass or nipple discharge.  Abdominal:     General: Bowel sounds are normal.     Palpations: Abdomen is soft.     Comments: Rounded, soft.   Genitourinary:    Comments: Deferred, as per patient.  Musculoskeletal:        General: Normal range of motion.     Cervical back: Normal range of motion and neck supple.     Left lower leg: No edema.  Skin:    General: Skin is warm.     Comments: Signs of facial acne scarring  Neurological:     General: No focal deficit present.     Mental Status: He is alert.  Psychiatric:        Mood and Affect: Mood normal.        Behavior: Behavior normal.         Assessment And Plan:    1. Routine general medical examination at health care facility Comments: A full exam was performed. DRE deferred, as per patient. PATIENT IS ADVISED TO GET 30-45 MINUTES REGULAR EXERCISE NO LESS THAN FOUR TO FIVE DAYS PER WEEK - BOTH WEIGHTBEARING EXERCISES AND AEROBIC ARE RECOMMENDED.  PATIENT IS ADVISED TO FOLLOW A HEALTHY DIET WITH AT LEAST SIX FRUITS/VEGGIES PER DAY, DECREASE INTAKE OF RED MEAT, AND TO INCREASE FISH INTAKE TO TWO DAYS PER WEEK.  MEATS/FISH SHOULD NOT BE FRIED, BAKED OR BROILED IS PREFERABLE.   I SUGGEST WEARING SPF 50 SUNSCREEN ON EXPOSED PARTS AND ESPECIALLY WHEN IN THE DIRECT SUNLIGHT FOR AN EXTENDED PERIOD OF TIME.  PLEASE AVOID FAST FOOD RESTAURANTS AND INCREASE YOUR WATER INTAKE.   2. Hypertensive heart disease without heart failure Comments: Chronic, fair control. He will continue on current medication for now. EKG not performed, recent EKG performed at Cardiology office. Encouraged to follow low sodium diet. EKG results from Dr. Einar Gip reviewed in detail, no new changes reported. He will rto in  six months for re-evaluation.   - CBC - CMP14+EGFR - POCT Urinalysis Dipstick (81002) - POCT UA - Microalbumin  3. Pure hypercholesterolemia Comments: Chronic, I will check lipid panel today. Encouraged to avoid fried foods and to aim for at least 150 minutes of exercise per week.  - Lipid panel  4. Other abnormal glucose  HIS A1C HAS BEEN ELEVATED IN THE PAST. I WILL CHECK AN A1C, BMET TODAY. HE WAS ENCOURAGED TO AVOID SUGARY BEVERAGES AND PROCESSED FOODS INCLUDNG BREADS, RICE AND PASTA.  - Hemoglobin A1c  5. Vitamin D deficiency  Comments: I will check vitamin D level and supplement as needed.  - VITAMIN D 25 Hydroxy (Vit-D Deficiency, Fractures)  6. Class 2 severe obesity due to excess calories with serious comorbidity and body mass index (BMI) of 35.0 to 35.9 in adult [facility]) Comments: He was congratulated on his weight loss thus far. He is encouraged to strive for BMI less than 30 to decrease cardiac risk.  He is encouraged to initially strive for BMI less than 30 to decrease cardiac risk. He is advised to exercise no less than 150 minutes per week.  Wt Readings from Last 3 Encounters:  03/26/20 251 lb 3.2 oz (113.9 kg)  03/25/20 254 lb 6.4 oz (115.4 kg)  01/11/20 261 lb 3.2 oz (118.5 kg)     Patient was given opportunity to ask questions. Patient verbalized understanding of the plan and was able to repeat key elements of the plan. All questions were answered to their  satisfaction.   [name], MD   I, [name], MD, have reviewed all documentation for this visit. The documentation on 04/13/20 for the exam, diagnosis, procedures, and orders are all accurate and complete.  THE PATIENT IS ENCOURAGED TO PRACTICE SOCIAL DISTANCING DUE TO THE COVID-19 PANDEMIC.

## 2020-03-26 NOTE — Patient Instructions (Signed)

## 2020-03-27 LAB — CMP14+EGFR
ALT: 19 IU/L (ref 0–44)
AST: 21 IU/L (ref 0–40)
Albumin/Globulin Ratio: 1.1 — ABNORMAL LOW (ref 1.2–2.2)
Albumin: 3.9 g/dL (ref 3.8–4.8)
Alkaline Phosphatase: 62 IU/L (ref 48–121)
BUN/Creatinine Ratio: 13 (ref 10–24)
BUN: 15 mg/dL (ref 8–27)
Bilirubin Total: <0.2 mg/dL (ref 0.0–1.2)
CO2: 23 mmol/L (ref 20–29)
Calcium: 9.5 mg/dL (ref 8.6–10.2)
Chloride: 100 mmol/L (ref 96–106)
Creatinine, Ser: 1.17 mg/dL (ref 0.76–1.27)
GFR calc Af Amer: 75 mL/min/{1.73_m2} (ref >59)
GFR calc non Af Amer: 65 mL/min/{1.73_m2} (ref >59)
Globulin, Total: 3.5 g/dL (ref 1.5–4.5)
Glucose: 96 mg/dL (ref 65–99)
Potassium: 3.5 mmol/L (ref 3.5–5.2)
Sodium: 141 mmol/L (ref 134–144)
Total Protein: 7.4 g/dL (ref 6.0–8.5)

## 2020-03-27 LAB — CBC
Hematocrit: 42.5 % (ref 37.5–51.0)
Hemoglobin: 14.2 g/dL (ref 13.0–17.7)
MCH: 31.1 pg (ref 26.6–33.0)
MCHC: 33.4 g/dL (ref 31.5–35.7)
MCV: 93 fL (ref 79–97)
Platelets: 256 10*3/uL (ref 150–450)
RBC: 4.57 x10E6/uL (ref 4.14–5.80)
RDW: 13.8 % (ref 11.6–15.4)
WBC: 9.3 10*3/uL (ref 3.4–10.8)

## 2020-03-27 LAB — LIPID PANEL
Chol/HDL Ratio: 2.8 ratio (ref 0.0–5.0)
Cholesterol, Total: 142 mg/dL (ref 100–199)
HDL: 51 mg/dL (ref >39)
LDL Chol Calc (NIH): 78 mg/dL (ref 0–99)
Triglycerides: 65 mg/dL (ref 0–149)
VLDL Cholesterol Cal: 13 mg/dL (ref 5–40)

## 2020-03-27 LAB — HEMOGLOBIN A1C
Est. average glucose Bld gHb Est-mCnc: 128 mg/dL
Hgb A1c MFr Bld: 6.1 % — ABNORMAL HIGH (ref 4.8–5.6)

## 2020-03-27 LAB — VITAMIN D 25 HYDROXY (VIT D DEFICIENCY, FRACTURES): Vit D, 25-Hydroxy: 80.1 ng/mL (ref 30.0–100.0)

## 2020-04-14 ENCOUNTER — Other Ambulatory Visit: Payer: Self-pay | Admitting: Cardiology

## 2020-04-17 DIAGNOSIS — L02212 Cutaneous abscess of back [any part, except buttock]: Secondary | ICD-10-CM | POA: Diagnosis not present

## 2020-04-17 DIAGNOSIS — L700 Acne vulgaris: Secondary | ICD-10-CM | POA: Diagnosis not present

## 2020-04-17 DIAGNOSIS — L308 Other specified dermatitis: Secondary | ICD-10-CM | POA: Diagnosis not present

## 2020-04-17 DIAGNOSIS — B9561 Methicillin susceptible Staphylococcus aureus infection as the cause of diseases classified elsewhere: Secondary | ICD-10-CM | POA: Diagnosis not present

## 2020-05-01 DIAGNOSIS — N281 Cyst of kidney, acquired: Secondary | ICD-10-CM | POA: Diagnosis not present

## 2020-05-07 ENCOUNTER — Other Ambulatory Visit: Payer: Medicare Other

## 2020-05-07 ENCOUNTER — Other Ambulatory Visit: Payer: Self-pay

## 2020-05-07 ENCOUNTER — Ambulatory Visit: Payer: Medicare Other | Attending: Internal Medicine

## 2020-05-07 DIAGNOSIS — Z23 Encounter for immunization: Secondary | ICD-10-CM

## 2020-05-07 DIAGNOSIS — E876 Hypokalemia: Secondary | ICD-10-CM

## 2020-05-07 DIAGNOSIS — N281 Cyst of kidney, acquired: Secondary | ICD-10-CM | POA: Diagnosis not present

## 2020-05-07 NOTE — Progress Notes (Signed)
   Covid-19 Vaccination Clinic  Name:  LATREL [id number]    MRN: [id number] DOB: April 08, 1951  05/07/2020  Mr. Laramie was observed post Covid-19 immunization for 15 minutes without incident. He was provided with Vaccine Information Sheet and instruction to access the V-Safe system.   Mr. Wettstein was instructed to call [phone] with any severe reactions post vaccine: Marland Kitchen Difficulty breathing  . Swelling of face and throat  . A fast heartbeat  . A bad rash all over body  . Dizziness and weakness

## 2020-05-08 ENCOUNTER — Ambulatory Visit: Payer: Medicare Other | Admitting: Family Medicine

## 2020-05-08 ENCOUNTER — Encounter: Payer: Self-pay | Admitting: Family Medicine

## 2020-05-08 VITALS — BP 129/75 | HR 87 | Ht 75.0 in | Wt 257.0 lb

## 2020-05-08 DIAGNOSIS — G4733 Obstructive sleep apnea (adult) (pediatric): Secondary | ICD-10-CM

## 2020-05-08 DIAGNOSIS — Z9989 Dependence on other enabling machines and devices: Secondary | ICD-10-CM

## 2020-05-08 LAB — BMP8+EGFR
BUN/Creatinine Ratio: 13 (ref 10–24)
BUN: 12 mg/dL (ref 8–27)
CO2: 24 mmol/L (ref 20–29)
Calcium: 9.4 mg/dL (ref 8.6–10.2)
Chloride: 99 mmol/L (ref 96–106)
Creatinine, Ser: 0.95 mg/dL (ref 0.76–1.27)
GFR calc Af Amer: 96 mL/min/{1.73_m2} (ref >59)
GFR calc non Af Amer: 83 mL/min/{1.73_m2} (ref >59)
Glucose: 163 mg/dL — ABNORMAL HIGH (ref 65–99)
Potassium: 3.1 mmol/L — ABNORMAL LOW (ref 3.5–5.2)
Sodium: 139 mmol/L (ref 134–144)

## 2020-05-08 NOTE — Progress Notes (Addendum)
Order for cpap supplies sent to [facility]  Via faxed to [phone]. Confirmation received that the order transmitted was successful.

## 2020-05-08 NOTE — Progress Notes (Addendum)
PATIENT: Terry Andrews DOB: February 19, 1951  REASON FOR VISIT: follow up HISTORY FROM: patient  Chief Complaint  Patient presents with  . Follow-up    rm 16  . Sleep Apnea     HISTORY OF PRESENT ILLNESS: Today 05/08/20 Terry Andrews is a 69 y.o. male here today for follow up for OSA on CPAP.  He continues to do very well with CPAP therapy.  He reports that he cannot sleep without his machine.  He did recently receive a new CPAP as his previous machine was shattered after dropping it in the bathroom.  He denies any difficulty with his new CPAP machine.  Compliance report dated 04/06/2020 through 05/05/2020 reveals that he used CPAP 30 of the past 30 days for compliance of 100%.  He used CPAP greater than 4 hours all 30 days.  Average usage was 7 hours and 22 minutes.  Residual AHI was 0.4 on 5 to 15 cm of water and EPR of 3.  There was a leak noted in the 95th percentile of 26.2 L/min.  He is using nasal pillows.  He does switch these out frequently as he continues to note leak at home.  He is not bothered by the sleep.    HISTORY: (copied from Dr Guadelupe Sabin note on 05/08/2019)  Mr. Kauer is a very pleasant 69 year old right-handed gentleman with an underlying medical history of reflux disease, hypertension, smoking, heart disease, remote Hx of right pontine stroke in 2003, remote history of substance abuse (2003), and obesity, who presents for follow-up consultation of his OSA, on treatment with AutoPap therapy. The patient is unaccompanied today and presents for his yearly checkup.  I last saw him on 05/05/2018, at which time he was compliant with his AutoPap therapy.  He had a recent stressor as his fianc had a stroke. He was advised to follow-up in 1 year.  Today, 05/08/2019: I reviewed his AutoPap compliance data from 04/03/2019 through 05/02/2019 which is a total of 30 days, during which time he used his AutoPap 28 days with percent use days greater than 4 hours at 93%, indicating excellent  compliance with an average usage of 8 hours and 20 minutes, residual AHI at goal at 0.3/h, 95th percentile of pressure at 8.2 cm with a range of 5 cm to 15 cm with EPR.  Leak on the high side with a 95th percentile at 25 L/min.  He reports doing well, notices less good sleep when he skips his AutoPap.  Generally, he is very compliant with treatment, uses nasal pillows with good success, is not particularly bothered by air leakage.  He is up-to-date with his supplies through [facility].  He saw his cardiologist recently and was started on potassium.   Previously:   I saw him on 05/05/2016, at which time he was compliant with his AutoPap. He was advised to be fully compliant with AutoPap, and encouraged to quit smoking cigarettes and Marijuana, and limit his alcohol intake.  He saw Cecille Rubin, nurse practitioner in the interim on 05/05/2017, at which time he was compliant with his AutoPap and advised to follow-up in one year routinely.  I reviewed his AutoPap compliance data from 04/03/2018 through 05/02/2018 which is a total of 30 days, during which time he used his AutoPap 28 days with percent used days greater than 4 hours at 90%, indicating excellent compliance with an average usage of 8 hours and 8 minutes, residual AHI at goal at 0.4 per hour, leak high  with the 95thpercentile at 28.1 L/m, 95thpercentile of pressure at 7.4 cm with a range of 5 cm to 15 cm with EPR.   I saw him on 05/06/2015, at which time he reported doing well. He liked his new CPAP machine. He had some issues with knee pain and back pain. He was seen a chiropractor for this. His cardiologist wanted to see him in one year. He was not quite ready to quit smoking. He was fully compliant with CPAP therapy.  05/05/2016: I reviewed his autoPAP compliance data from 04/03/2016 through 05/02/2016 which is a total of 30 days, during which time he used his machine 29 days with percent used days greater than 4 hours at  97%, indicating excellent compliance with an average usage of 7 hours and 15 minutes, residual AHI low at 0.3 per hour. 95th percentile pressure at 8.6 cm, leak acceptable with the 95th percentile at 19.1 L/m, pressure setting of 5-15 cm with EPR.    I saw him on 01/04/2015, at which time he reported feeling about the same. He did not bring his machine or SD card. He had no new sleep related complaints. He had lost a few pounds. He was feeling better with CPAP and was using his old machine. I suggested we start him on AutoPap therapy and provide him with a new machine. He reported that his brother has severe OSA and uses a CPAP too.   I reviewed his CPAP compliance data from 04/01/2015 through 04/30/2015 which is a total of 30 days during which time he used his machine every night except for 1, with percent used days greater than 4 hours at 97%, indicating excellent compliance with an average usage of 6 hours and 38 minutes, residual AHI low at 0.5 per hour, leak acceptable with the 95th percentile at 21.9 L/m, borderline, and 95th percentile of pressure at 8.8 cm, setting of AutoPap pap with a range of 5-15 cm with EPR.   I first met him on 11/15/2014 at the request of his cardiologist, at which time the patient reported a prior diagnosis of obstructive sleep apnea and treatment with CPAP for the past 10 years. He had noticed problems with his machine. Prior sleep test results were not available for me. His sleep study was over 10 years ago. I suggested he return for a potential split-night sleep study. He ended up having a baseline sleep study on 11/22/2014 and I went over his test results with him in detail today. His sleep efficiency was reduced markedly at 53%. Of note the patient had difficulty maintaining sleep and particularly had a hard time going back to sleep after 2:15 AM. Sleep latency was normal but wake after sleep onset was nearly 3 hours. There was mild to moderate sleep  fragmentation. He had an increased percentage of stage II sleep, and a decreased percentage of REM sleep at 9.7%. He had no significant PLMS. He had occasional PVCs on single-lead EKG. He had mild intermittent snoring. Total AHI was borderline at 4.5 per hour, rising to 32.2 per hour during REM sleep and 6.7 per hour in the supine position. He slept mostly in the supine position. Average oxygen saturation was 90% but there were faulty readings as well on the pulse oximeter throughout the night.    He was diagnosed with obstructive sleep apnea about 10 years ago. He was placed on CPAP therapy. He has been compliant with treatment. About 2 or 3 months ago he started noticing that the machine  was making a lot of noise and he actually quit using it. You encouraged him to restart using it and he also had a bad cough and bout of congestion which since then has improved but it made it hard for him to be compliant with CPAP therapy at the time. All in all he estimates that he has over the course of 10 years always been compliant with treatment about 85% of the time. He uses nasal pillows. He did not bring his machine today.  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 He is single. He has 2 grown daughters. He is the youngest of a total of 7 children. One sister died of breast cancer. Another sister has breast cancer. His brother has sleep apnea and encouraged him to get tested in the past. He still smokes and is not motivated to quit. He lost some weight with your encouragement. He does not have a set bedtime and wake time routine. He drinks caffeine in the form of coffee, tea and sodas throughout the day. He has a TV on at night throughout the night typically. He likes to sleep on his sides. He likes to take a nap in the afternoons. He is not known to have any parasomnias. He denies restless leg symptoms, morning headaches and nocturia. He wakes up  typically quite well rested when he is able to use CPAP but again lately, his machine has been defective.  His DME company is [facility]. Prior sleep test results are not available for my review today. I reviewed your office note from 10/15/2014 which you kindly included. Prior test results were also reviewed including carotid Doppler studies from 04/13/2011 which showed no significant stenosis of either internal carotid artery. Echocardiogram from 04/16/2011 showed normal EF, moderate LVH, grade 2 diastolic dysfunction. According to your office note he was diagnosed with obstructive sleep apnea on 05/21/2015 with a sleep study. Labs from 06/07/2014 showed normal TSH, A1c of 6.6, total cholesterol of 130, LDL at 71, CMP and CBC normal. EKG from 05/03/2014 showed normal sinus rhythm with borderline first-degree AV block and some PVCs. His CPAP has been nearly 66 years old and he has not had re-evaluation of his OSA and nearly 10 years.   REVIEW OF SYSTEMS: Out of a complete 14 system review of symptoms, the patient complains only of the following symptoms, none and all other reviewed systems are negative.  ESS: 6 FSS: 16  ALLERGIES: No Known Allergies  HOME MEDICATIONS: Outpatient Medications Prior to Visit  Medication Sig Dispense Refill  . aspirin 81 MG tablet Take 81 mg by mouth daily.    . Cholecalciferol (VITAMIN D) 125 MCG (5000 UT) CAPS Take 1 capsule by mouth daily.    Marland Kitchen EDARBYCLOR 40-25 MG TABS TAKE (1) TABLET DAILY IN THE MORNING. 30 tablet 0  . Omega-3 Fatty Acids (FISH OIL PO) Take by mouth. 1 daily    . spironolactone (ALDACTONE) 25 MG tablet TAKE 1 TABLET BY MOUTH IN  THE MORNING 90 tablet 3  . verapamil (CALAN-SR) 240 MG CR tablet TAKE 1 TABLET BY MOUTH  TWICE DAILY 180 tablet 3   No facility-administered medications prior to visit.    PAST MEDICAL HISTORY: Past Medical History:  Diagnosis Date  . GERD (gastroesophageal reflux disease)   . H/O: CVA  (cerebrovascular accident)   . Hypertension   . OSA (obstructive sleep apnea)  PAST SURGICAL HISTORY: History reviewed. No pertinent surgical history.  FAMILY HISTORY: Family History  Problem Relation Age of Onset  . Cancer Mother   . Stroke Father   . Heart disease Father     SOCIAL HISTORY: Social History   Socioeconomic History  . Marital status: Single    Spouse name: Not on file  . Number of children: 2  . Years of education: College  . Highest education level: Not on file  Occupational History  . Occupation: retired  Tobacco Use  . Smoking status: Current Every Day Smoker    Packs/day: 0.25    Years: 30.00    Pack years: 7.50    Types: Cigarettes  . Smokeless tobacco: Never Used  . Tobacco comment: he has cut back numbers cigs/smoked  Vaping Use  . Vaping Use: Never used  Substance and Sexual Activity  . Alcohol use: Yes    Alcohol/week: 0.0 standard drinks    Comment: Occasional   . Drug use: Yes    Types: Marijuana    Comment: every once in a while  . Sexual activity: Yes  Other Topics Concern  . Not on file  Social History Narrative   Some caffeine use    Social Determinants of Health   Financial Resource Strain: Low Risk   . Difficulty of Paying Living Expenses: Not hard at all  Food Insecurity: No Food Insecurity  . Worried About Charity fundraiser in the Last Year: Never true  . Ran Out of Food in the Last Year: Never true  Transportation Needs: No Transportation Needs  . Lack of Transportation (Medical): No  . Lack of Transportation (Non-Medical): No  Physical Activity: Inactive  . Days of Exercise per Week: 0 days  . Minutes of Exercise per Session: 0 min  Stress: No Stress Concern Present  . Feeling of Stress : Not at all  Social Connections:   . Frequency of Communication with Friends and Family: Not on file  . Frequency of Social Gatherings with Friends and Family: Not on file  . Attends Religious Services: Not on file  .  Active Member of Clubs or Organizations: Not on file  . Attends Archivist Meetings: Not on file  . Marital Status: Not on file  Intimate Partner Violence:   . Fear of Current or Ex-Partner: Not on file  . Emotionally Abused: Not on file  . Physically Abused: Not on file  . Sexually Abused: Not on file      PHYSICAL EXAM  Vitals:   05/08/20 0922  BP: 129/75  Pulse: 87  Weight: 257 lb (116.6 kg)  Height: _0  (1.905 m)   Body mass index is 32.12 kg/m.  Generalized: Well developed, in no acute distress  Cardiology: normal rate and rhythm, no murmur noted Respiratory: clear to auscultation bilaterally  Neurological examination  Mentation: Alert oriented to time, place, history taking. Follows all commands speech and language fluent Cranial nerve II-XII: Pupils were equal round reactive to light. Extraocular movements were full, visual field were full  Motor: The motor testing reveals 5 over 5 strength of all 4 extremities. Good symmetric motor tone is noted throughout.  Gait and station: Gait is normal.   DIAGNOSTIC DATA (LABS, IMAGING, TESTING) - I reviewed patient records, labs, notes, testing and imaging myself where available.  No flowsheet data found.   Lab Results  Component Value Date   WBC 9.3 03/26/2020   HGB 14.2 03/26/2020   HCT 42.5 03/26/2020  MCV 93 03/26/2020   PLT 256 03/26/2020      Component Value Date/Time   NA 139 05/07/2020 1410   K 3.1 (L) 05/07/2020 1410   CL 99 05/07/2020 1410   CO2 24 05/07/2020 1410   GLUCOSE 163 (H) 05/07/2020 1410   BUN 12 05/07/2020 1410   CREATININE 0.95 05/07/2020 1410   CALCIUM 9.4 05/07/2020 1410   PROT 7.4 03/26/2020 1649   ALBUMIN 3.9 03/26/2020 1649   AST 21 03/26/2020 1649   ALT 19 03/26/2020 1649   ALKPHOS 62 03/26/2020 1649   BILITOT <0.2 03/26/2020 1649   GFRNONAA 83 05/07/2020 1410   GFRAA 96 05/07/2020 1410   Lab Results  Component Value Date   CHOL 142 03/26/2020   HDL 51  03/26/2020   LDLCALC 78 03/26/2020   TRIG 65 03/26/2020   CHOLHDL 2.8 03/26/2020   Lab Results  Component Value Date   HGBA1C 6.1 (H) 03/26/2020   No results found for: VITAMINB12 No results found for: TSH     ASSESSMENT AND PLAN 69 y.o. year old male  has a past medical history of GERD (gastroesophageal reflux disease), H/O: CVA (cerebrovascular accident), Hypertension, and OSA (obstructive sleep apnea). here with     ICD-10-CM   1. OSA on CPAP  G47.33 For home use only DME continuous positive airway pressure (CPAP)   Z99.89     Andres is doing very well on CPAP therapy.  Compliance report reveals excellent compliance.  We have discussed concerns of continued leak.  He does not feel that he can get headgear any tighter.  He will continue to replace nasal pillow when he feels that it is starting to wear down.  Residual AHI is well managed.  He was encouraged to continue using CPAP nightly and for greater than 4 hours each night.  He will follow-up with [name] in 1 year, sooner if needed.  He verbalizes understanding and agreement with this plan.   Orders Placed This Encounter  Procedures  . For home use only DME continuous positive airway pressure (CPAP)    Supplies    Order Specific Question:   Length of Need    Answer:   Lifetime    Order Specific Question:   Patient has OSA or probable OSA    Answer:   Yes    Order Specific Question:   Is the patient currently using CPAP in the home    Answer:   Yes    Order Specific Question:   Settings    Answer:   Other see comments    Order Specific Question:   CPAP supplies needed    Answer:   Mask, headgear, cushions, filters, heated tubing and water chamber     No orders of the defined types were placed in this encounter.     I spent 15 minutes with the patient. 50% of this time was spent counseling and educating patient on plan of care and medications.    Debbora Presto, FNP-C 05/08/2020, 9:46 AM [facility] [address] [place],  [address] [phone]  I reviewed the above note and documentation by the Nurse Practitioner and agree with the history, exam, assessment and plan as outlined above. I was available for consultation. Star Age, MD, PhD [facility])

## 2020-05-08 NOTE — Patient Instructions (Signed)
Please continue using your CPAP regularly. While your insurance requires that you use CPAP at least 4 hours each night on 70% of the nights, I recommend, that you not skip any nights and use it throughout the night if you can. Getting used to CPAP and staying with the treatment long term does take time and patience and discipline. Untreated obstructive sleep apnea when it is moderate to severe can have an adverse impact on cardiovascular health and raise her risk for heart disease, arrhythmias, hypertension, congestive heart failure, stroke and diabetes. Untreated obstructive sleep apnea causes sleep disruption, nonrestorative sleep, and sleep deprivation. This can have an impact on your day to day functioning and cause daytime sleepiness and impairment of cognitive function, memory loss, mood disturbance, and problems focussing. Using CPAP regularly can improve these symptoms.   Follow up in 1 year   Sleep Apnea Sleep apnea affects breathing during sleep. It causes breathing to stop for a short time or to become shallow. It can also increase the risk of:  Heart attack.  Stroke.  Being very overweight (obese).  Diabetes.  Heart failure.  Irregular heartbeat. The goal of treatment is to help you breathe normally again. What are the causes? There are three kinds of sleep apnea:  Obstructive sleep apnea. This is caused by a blocked or collapsed airway.  Central sleep apnea. This happens when the brain does not send the right signals to the muscles that control breathing.  Mixed sleep apnea. This is a combination of obstructive and central sleep apnea. The most common cause of this condition is a collapsed or blocked airway. This can happen if:  Your throat muscles are too relaxed.  Your tongue and tonsils are too large.  You are overweight.  Your airway is too small. What increases the risk?  Being overweight.  Smoking.  Having a small airway.  Being older.  Being  male.  Drinking alcohol.  Taking medicines to calm yourself (sedatives or tranquilizers).  Having family members with the condition. What are the signs or symptoms?  Trouble staying asleep.  Being sleepy or tired during the day.  Getting angry a lot.  Loud snoring.  Headaches in the morning.  Not being able to focus your mind (concentrate).  Forgetting things.  Less interest in sex.  Mood swings.  Personality changes.  Feelings of sadness (depression).  Waking up a lot during the night to pee (urinate).  Dry mouth.  Sore throat. How is this diagnosed?  Your medical history.  A physical exam.  A test that is done when you are sleeping (sleep study). The test is most often done in a sleep lab but may also be done at home. How is this treated?   Sleeping on your side.  Using a medicine to get rid of mucus in your nose (decongestant).  Avoiding the use of alcohol, medicines to help you relax, or certain pain medicines (narcotics).  Losing weight, if needed.  Changing your diet.  Not smoking.  Using a machine to open your airway while you sleep, such as: ? An oral appliance. This is a mouthpiece that shifts your lower jaw forward. ? A CPAP device. This device blows air through a mask when you breathe out (exhale). ? An EPAP device. This has valves that you put in each nostril. ? A BPAP device. This device blows air through a mask when you breathe in (inhale) and breathe out.  Having surgery if other treatments do not work. It is   important to get treatment for sleep apnea. Without treatment, it can lead to:  High blood pressure.  Coronary artery disease.  In men, not being able to have an erection (impotence).  Reduced thinking ability. Follow these instructions at home: Lifestyle  Make changes that your doctor recommends.  Eat a healthy diet.  Lose weight if needed.  Avoid alcohol, medicines to help you relax, and some pain  medicines.  Do not use any products that contain nicotine or tobacco, such as cigarettes, e-cigarettes, and chewing tobacco. If you need help quitting, ask your doctor. General instructions  Take over-the-counter and prescription medicines only as told by your doctor.  If you were given a machine to use while you sleep, use it only as told by your doctor.  If you are having surgery, make sure to tell your doctor you have sleep apnea. You may need to bring your device with you.  Keep all follow-up visits as told by your doctor. This is important. Contact a doctor if:  The machine that you were given to use during sleep bothers you or does not seem to be working.  You do not get better.  You get worse. Get help right away if:  Your chest hurts.  You have trouble breathing in enough air.  You have an uncomfortable feeling in your back, arms, or stomach.  You have trouble talking.  One side of your body feels weak.  A part of your face is hanging down. These symptoms may be an emergency. Do not wait to see if the symptoms will go away. Get medical help right away. Call your local emergency services (911 in the [place] Do not drive yourself to the hospital. Summary  This condition affects breathing during sleep.  The most common cause is a collapsed or blocked airway.  The goal of treatment is to help you breathe normally while you sleep. This information is not intended to replace advice given to you by your health care provider. Make sure you discuss any questions you have with your health care provider. Document Revised: 05/06/2018 Document Reviewed: 03/15/2018 Elsevier Patient Education  2020 [employer]  

## 2020-05-10 DIAGNOSIS — H5203 Hypermetropia, bilateral: Secondary | ICD-10-CM | POA: Diagnosis not present

## 2020-05-10 DIAGNOSIS — H52223 Regular astigmatism, bilateral: Secondary | ICD-10-CM | POA: Diagnosis not present

## 2020-05-10 DIAGNOSIS — H524 Presbyopia: Secondary | ICD-10-CM | POA: Diagnosis not present

## 2020-05-12 ENCOUNTER — Other Ambulatory Visit: Payer: Self-pay | Admitting: Cardiology

## 2020-06-04 ENCOUNTER — Other Ambulatory Visit: Payer: Self-pay

## 2020-06-04 MED ORDER — POTASSIUM CHLORIDE CRYS ER 20 MEQ PO TBCR: 10.0000 meq | EXTENDED_RELEASE_TABLET | Freq: Every day | ORAL | 1 refills | Status: DC

## 2020-06-05 DIAGNOSIS — I700 Atherosclerosis of aorta: Secondary | ICD-10-CM | POA: Diagnosis not present

## 2020-06-05 DIAGNOSIS — N281 Cyst of kidney, acquired: Secondary | ICD-10-CM | POA: Diagnosis not present

## 2020-06-17 ENCOUNTER — Telehealth: Payer: Self-pay

## 2020-06-17 NOTE — Telephone Encounter (Signed)
The pt was notified that the office is out of edarbyclor 4-25, for him to check with his cardiologist.

## 2020-06-19 ENCOUNTER — Other Ambulatory Visit: Payer: Self-pay | Admitting: Cardiology

## 2020-07-01 ENCOUNTER — Other Ambulatory Visit: Payer: Medicare Other

## 2020-07-01 ENCOUNTER — Other Ambulatory Visit: Payer: Self-pay

## 2020-07-01 DIAGNOSIS — E876 Hypokalemia: Secondary | ICD-10-CM

## 2020-07-02 LAB — BMP8+EGFR
BUN/Creatinine Ratio: 16 (ref 10–24)
BUN: 15 mg/dL (ref 8–27)
CO2: 24 mmol/L (ref 20–29)
Calcium: 9.6 mg/dL (ref 8.6–10.2)
Chloride: 101 mmol/L (ref 96–106)
Creatinine, Ser: 0.91 mg/dL (ref 0.76–1.27)
GFR calc Af Amer: 100 mL/min/{1.73_m2} (ref >59)
GFR calc non Af Amer: 87 mL/min/{1.73_m2} (ref >59)
Glucose: 96 mg/dL (ref 65–99)
Potassium: 3.3 mmol/L — ABNORMAL LOW (ref 3.5–5.2)
Sodium: 141 mmol/L (ref 134–144)

## 2020-07-11 DIAGNOSIS — N281 Cyst of kidney, acquired: Secondary | ICD-10-CM | POA: Diagnosis not present

## 2020-07-21 ENCOUNTER — Other Ambulatory Visit: Payer: Self-pay | Admitting: Cardiology

## 2020-07-22 DIAGNOSIS — K921 Melena: Secondary | ICD-10-CM | POA: Diagnosis not present

## 2020-07-22 DIAGNOSIS — N281 Cyst of kidney, acquired: Secondary | ICD-10-CM | POA: Diagnosis not present

## 2020-07-23 DIAGNOSIS — G4733 Obstructive sleep apnea (adult) (pediatric): Secondary | ICD-10-CM | POA: Diagnosis not present

## 2020-08-12 DIAGNOSIS — K625 Hemorrhage of anus and rectum: Secondary | ICD-10-CM | POA: Diagnosis not present

## 2020-08-12 DIAGNOSIS — K640 First degree hemorrhoids: Secondary | ICD-10-CM | POA: Diagnosis not present

## 2020-08-17 ENCOUNTER — Other Ambulatory Visit: Payer: Self-pay | Admitting: Cardiology

## 2020-08-18 ENCOUNTER — Other Ambulatory Visit: Payer: Self-pay | Admitting: Cardiology

## 2020-08-21 ENCOUNTER — Other Ambulatory Visit: Payer: Self-pay

## 2020-08-21 MED ORDER — EDARBYCLOR 40-25 MG PO TABS: ORAL_TABLET | ORAL | 1 refills | Status: DC

## 2020-08-28 DIAGNOSIS — K640 First degree hemorrhoids: Secondary | ICD-10-CM | POA: Diagnosis not present

## 2020-09-11 DIAGNOSIS — K640 First degree hemorrhoids: Secondary | ICD-10-CM | POA: Diagnosis not present

## 2020-09-23 ENCOUNTER — Ambulatory Visit (INDEPENDENT_AMBULATORY_CARE_PROVIDER_SITE_OTHER): Payer: Medicare Other | Admitting: Internal Medicine

## 2020-09-23 ENCOUNTER — Encounter: Payer: Self-pay | Admitting: Internal Medicine

## 2020-09-23 ENCOUNTER — Other Ambulatory Visit: Payer: Self-pay

## 2020-09-23 VITALS — BP 114/62 | HR 74 | Temp 98.9°F | Ht 75.0 in | Wt 252.0 lb

## 2020-09-23 DIAGNOSIS — I119 Hypertensive heart disease without heart failure: Secondary | ICD-10-CM | POA: Diagnosis not present

## 2020-09-23 DIAGNOSIS — Z8673 Personal history of transient ischemic attack (TIA), and cerebral infarction without residual deficits: Secondary | ICD-10-CM | POA: Diagnosis not present

## 2020-09-23 DIAGNOSIS — R7309 Other abnormal glucose: Secondary | ICD-10-CM | POA: Diagnosis not present

## 2020-09-23 DIAGNOSIS — R002 Palpitations: Secondary | ICD-10-CM | POA: Diagnosis not present

## 2020-09-23 DIAGNOSIS — E6609 Other obesity due to excess calories: Secondary | ICD-10-CM | POA: Diagnosis not present

## 2020-09-23 DIAGNOSIS — Z6831 Body mass index (BMI) 31.0-31.9, adult: Secondary | ICD-10-CM

## 2020-09-23 NOTE — Progress Notes (Signed)
I,Tianna Badgett,acting as a Education administrator for [name], MD.,have documented all relevant documentation on the behalf of [name], MD,as directed by  [name], MD while in the presence of [name], MD.  This visit occurred during the SARS-CoV-2 public health emergency.  Safety protocols were in place, including screening questions prior to the visit, additional usage of staff PPE, and extensive cleaning of exam room while observing appropriate contact time as indicated for disinfecting solutions.  Subjective:     [name] , male    DOB: 1951/07/24 , 70 y.o.   MRN: [id number]   Chief Complaint  Patient presents with  . Hypertension    HPI  Patient is here for a hypertension follow up. He states that he is compliaint with medications. He checks his BP regularly. He has gotten two notifications that his heartbeat has been irregular. He states he has had irregular heart beats for "years". He does report having palpitations on occasion. He denies having any chest pain, shortness of breath, and/or diaphoresis.   Hypertension This is a chronic problem. The current episode started more than 1 year ago. The problem has been gradually improving since onset. The problem is controlled. Associated symptoms include palpitations. Pertinent negatives include no blurred vision, chest pain or shortness of breath. Risk factors for coronary artery disease include obesity and male gender. Past treatments include diuretics, calcium channel blockers and angiotensin blockers. The current treatment provides moderate improvement.     Past Medical History:  Diagnosis Date  . GERD (gastroesophageal reflux disease)   . H/O: CVA (cerebrovascular accident)   . Hypertension   . OSA (obstructive sleep apnea)      Family History  Problem Relation Age of Onset  . Cancer Mother   . Stroke Father   . Heart disease Father      Current Outpatient Medications:  .  aspirin 81 MG  tablet, Take 81 mg by mouth daily., Disp: , Rfl:  .  Azilsartan-Chlorthalidone (EDARBYCLOR) 40-25 MG TABS, TAKE (1) TABLET DAILY IN THE MORNING., Disp: 90 tablet, Rfl: 1 .  Cholecalciferol (VITAMIN D) 125 MCG (5000 UT) CAPS, Take 1 capsule by mouth daily., Disp: , Rfl:  .  Omega-3 Fatty Acids (FISH OIL PO), Take by mouth. 1 daily, Disp: , Rfl:  .  potassium chloride (KLOR-CON) 20 MEQ tablet, Take 0.5 tablets (10 mEq total) by mouth daily., Disp: 90 tablet, Rfl: 1 .  spironolactone (ALDACTONE) 25 MG tablet, TAKE 1 TABLET BY MOUTH IN  THE MORNING, Disp: 90 tablet, Rfl: 3 .  verapamil (CALAN-SR) 240 MG CR tablet, TAKE 1 TABLET BY MOUTH  TWICE DAILY, Disp: 180 tablet, Rfl: 3   No Known Allergies   Review of Systems  Constitutional: Negative.   Eyes: Negative for blurred vision.  Respiratory: Negative.  Negative for shortness of breath.   Cardiovascular: Positive for palpitations. Negative for chest pain.  Gastrointestinal: Negative.   Neurological: Negative.      Today's Vitals   09/23/20 0843  BP: 114/62  Pulse: 74  Temp: 98.9 F (37.2 C)  TempSrc: Oral  Weight: 252 lb (114.3 kg)  Height: '6\' 3"'  (1.905 m)   Body mass index is 31.5 kg/m.  Wt Readings from Last 3 Encounters:  09/23/20 252 lb (114.3 kg)  05/08/20 257 lb (116.6 kg)  03/26/20 251 lb 3.2 oz (113.9 kg)    Objective:  Physical Exam Vitals and nursing note reviewed.  Constitutional:  Appearance: Normal appearance.  HENT:     Head: Normocephalic and atraumatic.  Cardiovascular:     Rate and Rhythm: Normal rate and regular rhythm.     Heart sounds: Normal heart sounds.  Pulmonary:     Effort: Pulmonary effort is normal.     Breath sounds: Normal breath sounds.  Musculoskeletal:     Cervical back: Normal range of motion.  Skin:    General: Skin is warm.  Neurological:     General: No focal deficit present.     Mental Status: He is alert.  Psychiatric:        Mood and Affect: Mood normal.          Assessment And Plan:     1. Hypertensive heart disease without heart failure Comments: Chronic, well controlled. He is encouraged to follow low sodium diet.  I will check his renal function today.  - CMP14+EGFR  2. Palpitations Comments: EKG performed, SB w/ IVCD and LVH. I will check his electrolytes today.  - CMP14+EGFR - TSH - EKG 12-Lead  3. Other abnormal glucose Comments: His a1c has been elevated in the past. I will recheck this today. He is encouraged to limit his intake of sugary beverages.  - Hemoglobin A1c  4. History of stroke Comments: wE discussed need for statin therapy.   5. Class 1 obesity due to excess calories with serious comorbidity and body mass index (BMI) of 31.0 to 31.9 in adult Comments: He was congratulated on his 5 pound weight loss and encouraged to keep up the great work.  Patient was given opportunity to ask questions. Patient verbalized understanding of the plan and was able to repeat key elements of the plan. All questions were answered to their satisfaction.   I, [name], MD, have reviewed all documentation for this visit. The documentation on 09/23/20 for the exam, diagnosis, procedures, and orders are all accurate and complete.  THE PATIENT IS ENCOURAGED TO PRACTICE SOCIAL DISTANCING DUE TO THE COVID-19 PANDEMIC.

## 2020-09-23 NOTE — Patient Instructions (Signed)

## 2020-09-24 LAB — CMP14+EGFR
ALT: 19 IU/L (ref 0–44)
AST: 18 IU/L (ref 0–40)
Albumin/Globulin Ratio: 1.1 — ABNORMAL LOW (ref 1.2–2.2)
Albumin: 3.6 g/dL — ABNORMAL LOW (ref 3.8–4.8)
Alkaline Phosphatase: 53 IU/L (ref 44–121)
BUN/Creatinine Ratio: 15 (ref 10–24)
BUN: 14 mg/dL (ref 8–27)
Bilirubin Total: <0.2 mg/dL (ref 0.0–1.2)
CO2: 23 mmol/L (ref 20–29)
Calcium: 10 mg/dL (ref 8.6–10.2)
Chloride: 101 mmol/L (ref 96–106)
Creatinine, Ser: 0.94 mg/dL (ref 0.76–1.27)
GFR calc Af Amer: 97 mL/min/{1.73_m2} (ref >59)
GFR calc non Af Amer: 84 mL/min/{1.73_m2} (ref >59)
Globulin, Total: 3.3 g/dL (ref 1.5–4.5)
Glucose: 105 mg/dL — ABNORMAL HIGH (ref 65–99)
Potassium: 3.2 mmol/L — ABNORMAL LOW (ref 3.5–5.2)
Sodium: 139 mmol/L (ref 134–144)
Total Protein: 6.9 g/dL (ref 6.0–8.5)

## 2020-09-24 LAB — HEMOGLOBIN A1C
Est. average glucose Bld gHb Est-mCnc: 128 mg/dL
Hgb A1c MFr Bld: 6.1 % — ABNORMAL HIGH (ref 4.8–5.6)

## 2020-09-24 LAB — TSH: TSH: 0.786 u[IU]/mL (ref 0.450–4.500)

## 2020-09-27 ENCOUNTER — Other Ambulatory Visit: Payer: Self-pay

## 2020-10-04 ENCOUNTER — Other Ambulatory Visit: Payer: Self-pay

## 2020-10-07 DIAGNOSIS — K625 Hemorrhage of anus and rectum: Secondary | ICD-10-CM | POA: Diagnosis not present

## 2020-10-07 DIAGNOSIS — K610 Anal abscess: Secondary | ICD-10-CM | POA: Diagnosis not present

## 2020-10-07 DIAGNOSIS — L290 Pruritus ani: Secondary | ICD-10-CM | POA: Diagnosis not present

## 2020-10-21 ENCOUNTER — Other Ambulatory Visit: Payer: Self-pay

## 2020-10-21 ENCOUNTER — Other Ambulatory Visit: Payer: Medicare Other

## 2020-10-21 DIAGNOSIS — E876 Hypokalemia: Secondary | ICD-10-CM

## 2020-10-22 LAB — BMP8+EGFR
BUN/Creatinine Ratio: 15 (ref 10–24)
BUN: 14 mg/dL (ref 8–27)
CO2: 23 mmol/L (ref 20–29)
Calcium: 9.3 mg/dL (ref 8.6–10.2)
Chloride: 102 mmol/L (ref 96–106)
Creatinine, Ser: 0.96 mg/dL (ref 0.76–1.27)
Glucose: 106 mg/dL — ABNORMAL HIGH (ref 65–99)
Potassium: 3.1 mmol/L — ABNORMAL LOW (ref 3.5–5.2)
Sodium: 140 mmol/L (ref 134–144)
eGFR: 87 mL/min/{1.73_m2} (ref >59)

## 2020-10-30 ENCOUNTER — Other Ambulatory Visit: Payer: Self-pay

## 2020-10-30 ENCOUNTER — Other Ambulatory Visit: Payer: Self-pay | Admitting: Internal Medicine

## 2020-10-30 MED ORDER — POTASSIUM CHLORIDE CRYS ER 10 MEQ PO TBCR: 20.0000 meq | EXTENDED_RELEASE_TABLET | Freq: Every day | ORAL | 1 refills | Status: DC

## 2020-10-30 MED ORDER — POTASSIUM CHLORIDE ER 20 MEQ PO TBCR: EXTENDED_RELEASE_TABLET | ORAL | 1 refills | Status: DC

## 2020-10-31 MED ORDER — POTASSIUM CHLORIDE ER 20 MEQ PO TBCR: EXTENDED_RELEASE_TABLET | ORAL | 1 refills | Status: DC

## 2020-12-03 ENCOUNTER — Ambulatory Visit (INDEPENDENT_AMBULATORY_CARE_PROVIDER_SITE_OTHER): Payer: Medicare Other | Admitting: Internal Medicine

## 2020-12-03 ENCOUNTER — Other Ambulatory Visit: Payer: Self-pay

## 2020-12-03 ENCOUNTER — Encounter: Payer: Self-pay | Admitting: Internal Medicine

## 2020-12-03 VITALS — BP 114/86 | HR 75 | Temp 98.8°F | Ht 75.0 in | Wt 250.0 lb

## 2020-12-03 DIAGNOSIS — Z6831 Body mass index (BMI) 31.0-31.9, adult: Secondary | ICD-10-CM

## 2020-12-03 DIAGNOSIS — R7309 Other abnormal glucose: Secondary | ICD-10-CM | POA: Diagnosis not present

## 2020-12-03 DIAGNOSIS — E6609 Other obesity due to excess calories: Secondary | ICD-10-CM | POA: Diagnosis not present

## 2020-12-03 DIAGNOSIS — E876 Hypokalemia: Secondary | ICD-10-CM | POA: Diagnosis not present

## 2020-12-03 NOTE — Patient Instructions (Signed)
Potassium Content of Foods  Potassium is a mineral found in many foods and drinks. It affects how the heart works, and helps keep fluids and minerals balanced in the body. The amount of potassium you need each day depends on your age and any medical conditions you may have. Talk to your health care provider or dietitian about how much potassium you need. The following lists of foods provide the general serving size for foods and the approximate amount of potassium in each serving, listed in milligrams (mg). Actual values may vary depending on the product and how it is processed. High in potassium The following foods and beverages have 200 mg or more of potassium per serving:  Apricots (raw) - 2 have 200 mg of potassium.  Apricots (dry) - 5 have 200 mg of potassium.  Artichoke - 1 medium has 345 mg of potassium.  Avocado -  fruit has 245 mg of potassium.  Banana - 1 medium fruit has 425 mg of potassium.  [place] or baked beans (canned) -  cup has 280 mg of potassium.  White beans (canned) -  cup has 595 mg potassium.  Beef roast - 3 oz has 320 mg of potassium.  Ground beef - 3 oz has 270 mg of potassium.  Beets (raw or cooked) -  cup has 260 mg of potassium.  Bran muffin - 2 oz has 300 mg of potassium.  Broccoli (cooked) -  cup has 230 mg of potassium.  Brussels sprouts -  cup has 250 mg of potassium.  Cantaloupe -  cup has 215 mg of potassium.  Cereal, 100% bran -  cup has 200-400 mg of potassium.  Cheeseburger -1 single fast food burger has 225-400 mg of potassium.  Chicken - 3 oz has 220 mg of potassium.  Clams (canned) - 3 oz has 535 mg of potassium.  Crab - 3 oz has 225 mg of potassium.  Dates - 5 have 270 mg of potassium.  Dried beans and peas -  cup has 300-475 mg of potassium.  Figs (dried) - 2 have 260 mg of potassium.  Fish (halibut, tuna, cod, snapper) - 3 oz has 480 mg of potassium.  Fish (salmon, haddock, swordfish, perch) - 3 oz has 300 mg of  potassium.  Fish (tuna, canned) - 3 oz has 200 mg of potassium.  [place] fries (fast food) - 3 oz has 470 mg of potassium.  Granola with fruit and nuts -  cup has 200 mg of potassium.  Grapefruit juice -  cup has 200 mg of potassium.  Honeydew melon -  cup has 200 mg of potassium.  Kale (raw) - 1 cup has 300 mg of potassium.  Kiwi - 1 medium fruit has 240 mg of potassium.  Kohlrabi, rutabaga, parsnips -  cup has 280 mg of potassium.  Lentils -  cup has 365 mg of potassium.  Mango - 1 each has 325 mg of potassium.  Milk (nonfat, low-fat, whole, buttermilk) - 1 cup has 350-380 mg of potassium.  Milk (chocolate) - 1 cup has 420 mg of potassium  Molasses - 1 Tbsp has 295 mg of potassium.  Mushrooms -  cup has 280 mg of potassium.  Nectarine - 1 each has 275 mg of potassium.  Nuts (almonds, peanuts, hazelnuts, [place], cashew, mixed) - 1 oz has 200 mg of potassium.  Nuts (pistachios) - 1 oz has 295 mg of potassium.  Orange - 1 fruit has 240 mg of potassium.  Orange juice -  cup has 235 mg of potassium.  Papaya -  medium fruit has 390 mg of potassium.  Peanut butter (chunky) - 2 Tbsp has 240 mg of potassium.  Peanut butter (smooth) - 2 Tbsp has 210 mg of potassium.  Pear - 1 medium (200 mg) of potassium.  Pomegranate - 1 whole fruit has 400 mg of potassium.  Pomegranate juice -  cup has 215 mg of potassium.  Pork - 3 oz has 350 mg of potassium.  Potato chips (salted) - 1 oz has 465 mg of potassium.  Potato (baked with skin) - 1 medium has 925 mg of potassium.  Potato (boiled) -  cup has 255 mg of potassium.  Potato (Mashed) -  cup has 330 mg of potassium.  Prune juice -  cup has 370 mg of potassium.  Prunes - 5 have 305 mg of potassium.  Pudding (chocolate) -  cup has 230 mg of potassium.  Pumpkin (canned) -  cup has 250 mg of potassium.  Raisins (seedless) -  cup has 270 mg of potassium.  Seeds (sunflower or pumpkin) - 1 oz has 240 mg of  potassium.  Soy milk - 1 cup has 300 mg of potassium.  Spinach (cooked) - 1/2 cup has 420 mg of potassium.  Spinach (canned) -  cup has 370 mg of potassium.  Sweet potato (baked with skin) - 1 medium has 450 mg of potassium.  Swiss chard -  cup has 480 mg of potassium.  Tomato or vegetable juice -  cup has 275 mg of potassium.  Tomato (sauce or puree) -  cup has 400-550 mg of potassium.  Tomato (raw) - 1 medium has 290 mg of potassium.  Tomato (canned) -  cup has 200-300 mg of potassium.  Turkey - 3 oz has 250 mg of potassium.  Wheat germ - 1 oz has 250 mg of potassium.  Winter squash -  cup has 250 mg of potassium.  Yogurt (plain or fruited) - 6 oz has 260-435 mg of potassium.  Zucchini -  cup has 220 mg of potassium. Moderate in potassium The following foods and beverages have 50-200 mg of potassium per serving:  Apple - 1 fruit has 150 mg of potassium  Apple juice -  cup has 150 mg of potassium  Applesauce -  cup has 90 mg of potassium  Apricot nectar -  cup has 140 mg of potassium  Asparagus (small spears) -  cup has 155 mg of potassium  Asparagus (large spears) - 6 have 155 mg of potassium  Bagel (cinnamon raisin) - 1 four-inch bagel has 130 mg of potassium  Bagel (egg or plain) - 1 four- inch bagel has 70 mg of potassium  Beans (green) -  cup has 90 mg of potassium  Beans (yellow) -  cup has 190 mg of potassium  Beer, regular - 12 oz has 100 mg of potassium  Beets (canned) -  cup has 125 mg of potassium  Blackberries -  cup has 115 mg of potassium  Blueberries -  cup has 60 mg of potassium  Bread (whole wheat) - 1 slice has 70 mg of potassium  Broccoli (raw) -  cup has 145 mg of potassium  Cabbage -  cup has 150 mg of potassium  Carrots (cooked or raw) -  cup has 180 mg of potassium  Cauliflower (raw) -  cup has 150 mg of potassium  Celery (raw) -  cup has 155 mg of potassium  Cereal, bran   flakes -  cup has 120-150 mg  of potassium  Cheese (cottage) -  cup has 110 mg of potassium  Cherries - 10 have 150 mg of potassium  Chocolate - 1 oz bar has 165 mg of potassium  Coffee (brewed) - 6 oz has 90 mg of potassium  Corn -  cup or 1 ear has 195 mg of potassium  Cucumbers -  cup has 80 mg of potassium  Egg - 1 large egg has 60 mg of potassium  Eggplant -  cup has 60 mg of potassium  Endive (raw) -  cup has 80 mg of potassium  English muffin - 1 has 65 mg of potassium  Fish (ocean perch) - 3 oz has 192 mg of potassium  Frankfurter, beef or pork - 1 has 75 mg of potassium  Fruit cocktail -  cup has 115 mg of potassium  Grape juice -  cup has 170 mg of potassium  Grapefruit -  fruit has 175 mg of potassium  Grapes -  cup has 155 mg of potassium  Greens: kale, turnip, collard -  cup has 110-150 mg of potassium  Ice cream or frozen yogurt (chocolate) -  cup has 175 mg of potassium  Ice cream or frozen yogurt (vanilla) -  cup has 120-150 mg of potassium  Lemons, limes - 1 each has 80 mg of potassium  Lettuce - 1 cup has 100 mg of potassium  Mixed vegetables -  cup has 150 mg of potassium  Mushrooms, raw -  cup has 110 mg of potassium  Nuts (walnuts, pecans, or macadamia) - 1 oz has 125 mg of potassium  Oatmeal -  cup has 80 mg of potassium  Okra -  cup has 110 mg of potassium  Onions -  cup has 120 mg of potassium  Peach - 1 has 185 mg of potassium  Peaches (canned) -  cup has 120 mg of potassium  Pears (canned) -  cup has 120 mg of potassium  Peas, green (frozen) -  cup has 90 mg of potassium  Peppers (Green) -  cup has 130 mg of potassium  Peppers (Red) -  cup has 160 mg of potassium  Pineapple juice -  cup has 165 mg of potassium  Pineapple (fresh or canned) -  cup has 100 mg of potassium  Plums - 1 has 105 mg of potassium  Pudding, vanilla -  cup has 150 mg of potassium  Raspberries -  cup has 90 mg of potassium  Rhubarb -  cup has  115 mg of potassium  Rice, wild -  cup has 80 mg of potassium  Shrimp - 3 oz has 155 mg of potassium  Spinach (raw) - 1 cup has 170 mg of potassium  Strawberries -  cup has 125 mg of potassium  Summer squash -  cup has 175-200 mg of potassium  Swiss chard (raw) - 1 cup has 135 mg of potassium  Tangerines - 1 fruit has 140 mg of potassium  Tea, brewed - 6 oz has 65 mg of potassium  Turnips -  cup has 140 mg of potassium  Watermelon -  cup has 85 mg of potassium  Wine (Red, table) - 5 oz has 180 mg of potassium  Wine (White, table) - 5 oz 100 mg of potassium Low in potassium The following foods and beverages have less than 50 mg of potassium per serving.  Bread (white) - 1 slice has 30 mg of potassium    Carbonated beverages - 12 oz has less than 5 mg of potassium  Cheese - 1 oz has 20-30 mg of potassium  Cranberries -  cup has 45 mg of potassium  Cranberry juice cocktail -  cup has 20 mg of potassium  Fats and oils - 1 Tbsp has less than 5 mg of potassium  Hummus - 1 Tbsp has 32 mg of potassium  Nectar (papaya, mango, or pear) -  cup has 35 mg of potassium  Rice (white or brown) -  cup has 50 mg of potassium  Spaghetti or macaroni (cooked) -  cup has 30 mg of potassium  Tortilla, flour or corn - 1 has 50 mg of potassium  Waffle - 1 four-inch waffle has 50 mg of potassium  Water chestnuts -  cup has 40 mg of potassium Summary  Potassium is a mineral found in many foods and drinks. It affects how the heart works, and helps keep fluids and minerals balanced in the body.  The amount of potassium you need each day depends on your age and any existing medical conditions you may have. Your health care provider or dietitian may recommend an amount of potassium that you should have each day. This information is not intended to replace advice given to you by your health care provider. Make sure you discuss any questions you have with your health care  provider. Document Revised: 07/02/2017 Document Reviewed: 10/14/2016 Elsevier Patient Education  [place].

## 2020-12-03 NOTE — Progress Notes (Signed)
I,[name],acting as a Education administrator for [name], MD.,have documented all relevant documentation on the behalf of [name], MD,as directed by  [name], MD while in the presence of [name], MD.  This visit occurred during the SARS-CoV-2 public health emergency.  Safety protocols were in place, including screening questions prior to the visit, additional usage of staff PPE, and extensive cleaning of exam room while observing appropriate contact time as indicated for disinfecting solutions.  Subjective:     [name] , male    DOB: 1951-01-11 , 70 y.o.   MRN: [id number]   Chief Complaint  Patient presents with  . potassium f/u    HPI  He presents today for potassium check. He reports compliance with potassium chloride 48meq daily. He has not had any issues with this regimen. Feels well and without complaints.     Past Medical History:  Diagnosis Date  . GERD (gastroesophageal reflux disease)   . H/O: CVA (cerebrovascular accident)   . Hypertension   . OSA (obstructive sleep apnea)      Family History  Problem Relation Age of Onset  . Cancer Mother   . Stroke Father   . Heart disease Father      Current Outpatient Medications:  .  aspirin 81 MG tablet, Take 81 mg by mouth daily., Disp: , Rfl:  .  Azilsartan-Chlorthalidone (EDARBYCLOR) 40-25 MG TABS, TAKE (1) TABLET DAILY IN THE MORNING., Disp: 90 tablet, Rfl: 1 .  Cholecalciferol (VITAMIN D) 125 MCG (5000 UT) CAPS, Take 1 capsule by mouth daily., Disp: , Rfl:  .  Omega-3 Fatty Acids (FISH OIL PO), Take by mouth. 1 daily, Disp: , Rfl:  .  Potassium Chloride ER 20 MEQ TBCR, One tap po qd, Disp: 90 tablet, Rfl: 1 .  spironolactone (ALDACTONE) 25 MG tablet, TAKE 1 TABLET BY MOUTH IN  THE MORNING, Disp: 90 tablet, Rfl: 3 .  verapamil (CALAN-SR) 240 MG CR tablet, TAKE 1 TABLET BY MOUTH  TWICE DAILY, Disp: 180 tablet, Rfl: 3   No Known Allergies   Review of Systems  Constitutional:  Negative.   Respiratory: Negative.   Cardiovascular: Negative.   Gastrointestinal: Negative.   Neurological: Negative.   Psychiatric/Behavioral: Negative.      Today's Vitals   12/03/20 0954  BP: 114/86  Pulse: 75  Temp: 98.8 F (37.1 C)  TempSrc: Oral  Weight: 250 lb (113.4 kg)  Height: 6\' 3"  (1.905 m)  PainSc: 0-No pain   Body mass index is 31.25 kg/m.  Wt Readings from Last 3 Encounters:  12/03/20 250 lb (113.4 kg)  09/23/20 252 lb (114.3 kg)  05/08/20 257 lb (116.6 kg)   Objective:  Physical Exam Vitals and nursing note reviewed.  Constitutional:      Appearance: Normal appearance.  HENT:     Head: Normocephalic and atraumatic.     Nose:     Comments: Masked     Mouth/Throat:     Comments: Masked  Cardiovascular:     Rate and Rhythm: Normal rate and regular rhythm.     Heart sounds: Normal heart sounds.  Pulmonary:     Effort: Pulmonary effort is normal.     Breath sounds: Normal breath sounds.  Musculoskeletal:     Cervical back: Normal range of motion.  Skin:    General: Skin is warm.  Neurological:     General: No focal deficit present.     Mental Status: He is alert.  Psychiatric:        Mood and Affect: Mood normal.         Assessment And Plan:     1. Hypokalemia Comments: I will recheck potassium levels today. For now, he will continue with KCl 43meq daily. Encouraged to increase intake of high potassium foods.  - Potassium  2. Other abnormal glucose Comments: His a1c has been elevated in the past. I will check an a1c today. He will rto in August 2022 for his next physical exam.  - Hemoglobin A1c  3. Class 1 obesity due to excess calories with serious comorbidity and body mass index (BMI) of 31.0 to 31.9 in adult He is encouraged to initially strive for BMI less than 30 to decrease cardiac risk. He is advised to exercise no less than 150 minutes per week.   Patient was given opportunity to ask questions. Patient verbalized understanding  of the plan and was able to repeat key elements of the plan. All questions were answered to their satisfaction.   I, [name], MD, have reviewed all documentation for this visit. The documentation on 12/03/20 for the exam, diagnosis, procedures, and orders are all accurate and complete.   IF YOU HAVE BEEN REFERRED TO A SPECIALIST, IT MAY TAKE 1-2 WEEKS TO SCHEDULE/PROCESS THE REFERRAL. IF YOU HAVE NOT HEARD FROM US/SPECIALIST IN TWO WEEKS, PLEASE GIVE [name] A CALL AT [phone].   THE PATIENT IS ENCOURAGED TO PRACTICE SOCIAL DISTANCING DUE TO THE COVID-19 PANDEMIC.

## 2020-12-04 LAB — POTASSIUM: Potassium: 3.3 mmol/L — ABNORMAL LOW (ref 3.5–5.2)

## 2020-12-04 LAB — HEMOGLOBIN A1C
Est. average glucose Bld gHb Est-mCnc: 131 mg/dL
Hgb A1c MFr Bld: 6.2 % — ABNORMAL HIGH (ref 4.8–5.6)

## 2020-12-06 ENCOUNTER — Telehealth: Payer: Self-pay

## 2020-12-06 NOTE — Telephone Encounter (Signed)
Left the patient a message to call back for lab results. 

## 2020-12-06 NOTE — Telephone Encounter (Signed)
-----   Message from Glendale Chard, MD sent at 12/04/2020  8:49 PM EDT ----- Your potassium levels have improved, just under normal. Are your potassium pills scored? )is there a line down the middle?) if yes, please get pill cutter so you can take 1 tab daily M-F and take 1-1/2 on Sat/Sun.   Prediabetes test is 6.2, at 6.5 you will be considered to have diabetes.   If you agree to increase potassium on the weekends, I would like to recheck your levels in 3 weeks. Please schedule LAB visit, check Potassium level dx: hypokalemia. Please put in future orders now. thx

## 2020-12-09 ENCOUNTER — Other Ambulatory Visit: Payer: Self-pay

## 2020-12-09 DIAGNOSIS — E876 Hypokalemia: Secondary | ICD-10-CM

## 2020-12-31 ENCOUNTER — Other Ambulatory Visit: Payer: Self-pay

## 2020-12-31 ENCOUNTER — Other Ambulatory Visit: Payer: Medicare Other

## 2020-12-31 DIAGNOSIS — E876 Hypokalemia: Secondary | ICD-10-CM | POA: Diagnosis not present

## 2020-12-31 LAB — POTASSIUM: Potassium: 3.3 mmol/L — ABNORMAL LOW (ref 3.5–5.2)

## 2021-01-06 DIAGNOSIS — L732 Hidradenitis suppurativa: Secondary | ICD-10-CM | POA: Diagnosis not present

## 2021-01-06 DIAGNOSIS — G4733 Obstructive sleep apnea (adult) (pediatric): Secondary | ICD-10-CM | POA: Diagnosis not present

## 2021-01-07 ENCOUNTER — Other Ambulatory Visit: Payer: Self-pay

## 2021-01-07 MED ORDER — POTASSIUM CHLORIDE ER 20 MEQ PO TBCR: EXTENDED_RELEASE_TABLET | ORAL | 1 refills | Status: DC

## 2021-01-16 ENCOUNTER — Ambulatory Visit (INDEPENDENT_AMBULATORY_CARE_PROVIDER_SITE_OTHER): Payer: Medicare Other

## 2021-01-16 ENCOUNTER — Ambulatory Visit: Payer: Medicare Other | Admitting: Internal Medicine

## 2021-01-16 VITALS — Ht 75.0 in | Wt 250.0 lb

## 2021-01-16 DIAGNOSIS — Z0000 Encounter for general adult medical examination without abnormal findings: Secondary | ICD-10-CM | POA: Diagnosis not present

## 2021-01-16 NOTE — Progress Notes (Signed)
I connected with Terry Andrews today by telephone and verified that I am speaking with the correct person using two identifiers. Location patient: home Location provider: work Persons participating in the virtual visit: Terry Andrews, Terry Andrews.   I discussed the limitations, risks, security and privacy concerns of performing an evaluation and management service by telephone and the availability of in person appointments. I also discussed with the patient that there may be a patient responsible charge related to this service. The patient expressed understanding and verbally consented to this telephonic visit.    Interactive audio and video telecommunications were attempted between this provider and patient, however failed, due to patient having technical difficulties OR patient did not have access to video capability.  We continued and completed visit with audio only.     Vital signs may be patient reported or missing.  Subjective:   Terry Andrews is a 70 y.o. male who presents for Medicare Annual/Subsequent preventive examination.  Review of Systems     Cardiac Risk Factors include: advanced age (>10men, >35 women);obesity (BMI >30kg/m2);smoking/ tobacco exposure;sedentary lifestyle     Objective:    Today's Vitals   01/16/21 0826 01/16/21 0828  Weight: 250 lb (113.4 kg)   Height: 6\' 3"  (1.905 m)   PainSc:  3    Body mass index is 31.25 kg/m.  Advanced Directives 01/16/2021 01/11/2020 01/10/2019  Does Patient Have a Medical Advance Directive? No Yes Yes  Type of Advance Directive - [place];Living will [place] in Chart? - Yes - validated most recent copy scanned in chart (See row information) -    Current Medications (verified) Outpatient Encounter Medications as of 01/16/2021  Medication Sig   aspirin 81 MG tablet Take 81 mg by mouth daily.   Azilsartan-Chlorthalidone (EDARBYCLOR) 40-25 MG TABS  TAKE (1) TABLET DAILY IN THE MORNING.   Cholecalciferol (VITAMIN D) 125 MCG (5000 UT) CAPS Take 1 capsule by mouth daily.   Omega-3 Fatty Acids (FISH OIL PO) Take by mouth. 1 daily   Potassium Chloride ER 20 MEQ TBCR One 1.5 tab by mouth daily   spironolactone (ALDACTONE) 25 MG tablet TAKE 1 TABLET BY MOUTH IN  THE MORNING   verapamil (CALAN-SR) 240 MG CR tablet TAKE 1 TABLET BY MOUTH  TWICE DAILY   No facility-administered encounter medications on file as of 01/16/2021.    Allergies (verified) Patient has no known allergies.   History: Past Medical History:  Diagnosis Date   GERD (gastroesophageal reflux disease)    H/O: CVA (cerebrovascular accident)    Hypertension    OSA (obstructive sleep apnea)    History reviewed. No pertinent surgical history. Family History  Problem Relation Age of Onset   Cancer Mother    Stroke Father    Heart disease Father    Social History   Socioeconomic History   Marital status: Single    Spouse name: Not on file   Number of children: 2   Years of education: College   Highest education level: Not on file  Occupational History   Occupation: retired  Tobacco Use   Smoking status: Every Day    Packs/day: 0.25    Years: 30.00    Pack years: 7.50    Types: Cigarettes   Smokeless tobacco: Never   Tobacco comments:    he has cut back numbers cigs/smoked  Vaping Use   Vaping Use: Never used  Substance and Sexual Activity  Alcohol use: Yes    Alcohol/week: 0.0 standard drinks    Comment: Occasional    Drug use: Yes    Types: Marijuana    Comment: every once in a while   Sexual activity: Yes  Other Topics Concern   Not on file  Social History Narrative   Some caffeine use    Social Determinants of Health   Financial Resource Strain: Low Risk    Difficulty of Paying Living Expenses: Not hard at all  Food Insecurity: No Food Insecurity   Worried About Charity fundraiser in the Last Year: Never true   [place] in the Last  Year: Never true  Transportation Needs: No Transportation Needs   Lack of Transportation (Medical): No   Lack of Transportation (Non-Medical): No  Physical Activity: Inactive   Days of Exercise per Week: 0 days   Minutes of Exercise per Session: 0 min  Stress: No Stress Concern Present   Feeling of Stress : Not at all  Social Connections: Not on file    Tobacco Counseling Ready to quit: No Counseling given: Not Answered Tobacco comments: he has cut back numbers cigs/smoked   Clinical Intake:  Pre-visit preparation completed: Yes  Pain : 0-10 Pain Score: 3  Pain Type: Chronic pain Pain Location: Generalized Pain Descriptors / Indicators: Aching Pain Onset: More than a month ago Pain Frequency: Constant     Nutritional Status: BMI > 30  Obese Nutritional Risks: None Diabetes: No  How often do you need to have someone help you when you read instructions, pamphlets, or other written materials from your doctor or pharmacy?: 1 - Never What is the last grade level you completed in school?: college  Diabetic? no  Interpreter Needed?: No  Information entered by :: NAllen Andrews   Activities of Daily Living In your present state of health, do you have any difficulty performing the following activities: 01/16/2021  Hearing? N  Vision? N  Difficulty concentrating or making decisions? N  Walking or climbing stairs? N  Dressing or bathing? N  Doing errands, shopping? N  Preparing Food and eating ? N  Using the Toilet? N  In the past six months, have you accidently leaked urine? N  Do you have problems with loss of bowel control? N  Managing your Medications? N  Managing your Finances? N  Housekeeping or managing your Housekeeping? N  Some recent data might be hidden    Patient Care Team: Glendale Chard, MD as PCP - General (Internal Medicine) Adrian Prows, MD as PCP - Cardiology (Cardiology)  Indicate any recent Medical Services you may have received from other than  [facility] providers in the past year (date may be approximate).     Assessment:   This is a routine wellness examination for [name].  Hearing/Vision screen Vision Screening - Comments:: Regular eye exams, Dr. Simonne Come  Dietary issues and exercise activities discussed: Current Exercise Habits: The patient does not participate in regular exercise at present   Goals Addressed             This Visit's Progress    Patient Stated       01/16/2021, stay alive        Depression Screen PHQ 2/9 Scores 01/16/2021 01/11/2020 03/08/2019 01/10/2019 12/13/2018 08/11/2018  PHQ - 2 Score 0 0 0 0 0 0  PHQ- 9 Score - 0 0 0 - -    Fall Risk Fall Risk  01/16/2021 01/11/2020 03/08/2019 01/10/2019 12/13/2018  Falls  in the past year? 0 0 0 0 0  Risk for fall due to : Medication side effect - - Medication side effect -  Follow up Falls evaluation completed;Education provided;Falls prevention discussed - - Falls prevention discussed -    FALL RISK PREVENTION PERTAINING TO THE HOME:  Any stairs in or around the home? Yes  If so, are there any without handrails? No  Home free of loose throw rugs in walkways, pet beds, electrical cords, etc? Yes  Adequate lighting in your home to reduce risk of falls? Yes   ASSISTIVE DEVICES UTILIZED TO PREVENT FALLS:  Life alert? No  Use of a cane, walker or w/c? No  Grab bars in the bathroom? No  Shower chair or bench in shower? No  Elevated toilet seat or a handicapped toilet? Yes   TIMED UP AND GO:  Was the test performed? No .      Cognitive Function:     6CIT Screen 01/16/2021 01/11/2020 01/10/2019  What Year? 0 points 0 points 0 points  What month? 0 points 0 points 0 points  What time? 0 points 0 points 3 points  Count back from 20 0 points 0 points 0 points  Months in reverse 0 points 0 points 0 points  Repeat phrase 6 points 4 points 0 points  Total Score 6 4 3     Immunizations Immunization History  Administered Date(s) Administered   PFIZER(Purple  Top)SARS-COV-2 Vaccination 08/22/2019, 09/12/2019, 05/07/2020, 12/20/2020   Tdap 02/18/2017    TDAP status: Up to date  Flu Vaccine status: Declined, Education has been provided regarding the importance of this vaccine but patient still declined. Advised may receive this vaccine at local pharmacy or Health Dept. Aware to provide a copy of the vaccination record if obtained from local pharmacy or Health Dept. Verbalized acceptance and understanding.  Pneumococcal vaccine status: Declined,  Education has been provided regarding the importance of this vaccine but patient still declined. Advised may receive this vaccine at local pharmacy or Health Dept. Aware to provide a copy of the vaccination record if obtained from local pharmacy or Health Dept. Verbalized acceptance and understanding.   Covid-19 vaccine status: Completed vaccines  Qualifies for Shingles Vaccine? Yes   Zostavax completed No   Shingrix Completed?: No.    Education has been provided regarding the importance of this vaccine. Patient has been advised to call insurance company to determine out of pocket expense if they have not yet received this vaccine. Advised may also receive vaccine at local pharmacy or Health Dept. Verbalized acceptance and understanding.  Screening Tests Health Maintenance  Topic Date Due   Zoster Vaccines- Shingrix (1 of 2) Never done   PNA vac Low Risk Adult (1 of 2 - PCV13) 01/16/2022 (Originally 06/01/2018)   INFLUENZA VACCINE  03/03/2021   TETANUS/TDAP  02/19/2027   COLONOSCOPY (Pts 45-60yrs Insurance coverage will need to be confirmed)  12/20/2029   COVID-19 Vaccine  Completed   Hepatitis C Screening  Completed   HPV VACCINES  Aged Out    Health Maintenance  Health Maintenance Due  Topic Date Due   Zoster Vaccines- Shingrix (1 of 2) Never done    Colorectal cancer screening: Type of screening: Colonoscopy. Completed 12/21/2019. Repeat every 5 years  Lung Cancer Screening: (Low Dose CT  Chest recommended if Age 72-80 years, 30 pack-year currently smoking OR have quit w/in 15years.) does not qualify.   Lung Cancer Screening Referral: no  Additional Screening:  Hepatitis C Screening: does qualify;  Completed 08/11/2018  Vision Screening: Recommended annual ophthalmology exams for early detection of glaucoma and other disorders of the eye. Is the patient up to date with their annual eye exam?  Yes  Who is the provider or what is the name of the office in which the patient attends annual eye exams? Dr. Simonne Come If pt is not established with a provider, would they like to be referred to a provider to establish care? No .   Dental Screening: Recommended annual dental exams for proper oral hygiene  Community Resource Referral / Chronic Care Management: CRR required this visit?  No   CCM required this visit?  No      Plan:     I have personally reviewed and noted the following in the patient's chart:   Medical and social history Use of alcohol, tobacco or illicit drugs  Current medications and supplements including opioid prescriptions. Patient is not currently taking opioid prescriptions. Functional ability and status Nutritional status Physical activity Advanced directives List of other physicians Hospitalizations, surgeries, and ER visits in previous 12 months Vitals Screenings to include cognitive, depression, and falls Referrals and appointments  In addition, I have reviewed and discussed with patient certain preventive protocols, quality metrics, and best practice recommendations. A written personalized care plan for preventive services as well as general preventive health recommendations were provided to patient.     Kellie Simmering, Andrews   09[birth year]   Nurse Notes:

## 2021-01-16 NOTE — Patient Instructions (Signed)
[name] , Thank you for taking time to come for your Medicare Wellness Visit. I appreciate your ongoing commitment to your health goals. Please review the following plan we discussed and let me know if I can assist you in the future.   Screening recommendations/referrals: Colonoscopy: completed 12/21/2019 Recommended yearly ophthalmology/optometry visit for glaucoma screening and checkup Recommended yearly dental visit for hygiene and checkup  Vaccinations: Influenza vaccine: decline Pneumococcal vaccine: decline Tdap vaccine: completed 02/18/2017, due 02/19/2027 Shingles vaccine: discussed   Covid-19:  12/20/2020, 05/07/2020, 09/12/2019, 08/22/2019  Advanced directives: Advance directive discussed with you today. Even though you declined this today please call our office should you change your mind and we can give you the proper paperwork for you to fill out.  Conditions/risks identified: smoking  Next appointment: Follow up in one year for your annual wellness visit.   Preventive Care 37 Years and Older, Male Preventive care refers to lifestyle choices and visits with your health care provider that can promote health and wellness. What does preventive care include? A yearly physical exam. This is also called an annual well check. Dental exams once or twice a year. Routine eye exams. Ask your health care provider how often you should have your eyes checked. Personal lifestyle choices, including: Daily care of your teeth and gums. Regular physical activity. Eating a healthy diet. Avoiding tobacco and drug use. Limiting alcohol use. Practicing safe sex. Taking low doses of aspirin every day. Taking vitamin and mineral supplements as recommended by your health care provider. What happens during an annual well check? The services and screenings done by your health care provider during your annual well check will depend on your age, overall health, lifestyle risk factors, and family  history of disease. Counseling  Your health care provider may ask you questions about your: Alcohol use. Tobacco use. Drug use. Emotional well-being. Home and relationship well-being. Sexual activity. Eating habits. History of falls. Memory and ability to understand (cognition). Work and work Statistician. Screening  You may have the following tests or measurements: Height, weight, and BMI. Blood pressure. Lipid and cholesterol levels. These may be checked every 5 years, or more frequently if you are over 3 years old. Skin check. Lung cancer screening. You may have this screening every year starting at age 37 if you have a 30-pack-year history of smoking and currently smoke or have quit within the past 15 years. Fecal occult blood test (FOBT) of the stool. You may have this test every year starting at age 81. Flexible sigmoidoscopy or colonoscopy. You may have a sigmoidoscopy every 5 years or a colonoscopy every 10 years starting at age 25. Prostate cancer screening. Recommendations will vary depending on your family history and other risks. Hepatitis C blood test. Hepatitis B blood test. Sexually transmitted disease (STD) testing. Diabetes screening. This is done by checking your blood sugar (glucose) after you have not eaten for a while (fasting). You may have this done every 1-3 years. Abdominal aortic aneurysm (AAA) screening. You may need this if you are a current or former smoker. Osteoporosis. You may be screened starting at age 56 if you are at high risk. Talk with your health care provider about your test results, treatment options, and if necessary, the need for more tests. Vaccines  Your health care provider may recommend certain vaccines, such as: Influenza vaccine. This is recommended every year. Tetanus, diphtheria, and acellular pertussis (Tdap, Td) vaccine. You may need a Td booster every 10 years. Zoster vaccine. You  may need this after age 31. Pneumococcal  13-valent conjugate (PCV13) vaccine. One dose is recommended after age 14. Pneumococcal polysaccharide (PPSV23) vaccine. One dose is recommended after age 79. Talk to your health care provider about which screenings and vaccines you need and how often you need them. This information is not intended to replace advice given to you by your health care provider. Make sure you discuss any questions you have with your health care provider. Document Released: 08/16/2015 Document Revised: 04/08/2016 Document Reviewed: 05/21/2015 Elsevier Interactive Patient Education  2017 [employer] Prevention in the Home Falls can cause injuries. They can happen to people of all ages. There are many things you can do to make your home safe and to help prevent falls. What can I do on the outside of my home? Regularly fix the edges of walkways and driveways and fix any cracks. Remove anything that might make you trip as you walk through a door, such as a raised step or threshold. Trim any bushes or trees on the path to your home. Use bright outdoor lighting. Clear any walking paths of anything that might make someone trip, such as rocks or tools. Regularly check to see if handrails are loose or broken. Make sure that both sides of any steps have handrails. Any raised decks and porches should have guardrails on the edges. Have any leaves, snow, or ice cleared regularly. Use sand or salt on walking paths during winter. Clean up any spills in your garage right away. This includes oil or grease spills. What can I do in the bathroom? Use night lights. Install grab bars by the toilet and in the tub and shower. Do not use towel bars as grab bars. Use non-skid mats or decals in the tub or shower. If you need to sit down in the shower, use a plastic, non-slip stool. Keep the floor dry. Clean up any water that spills on the floor as soon as it happens. Remove soap buildup in the tub or shower regularly. Attach  bath mats securely with double-sided non-slip rug tape. Do not have throw rugs and other things on the floor that can make you trip. What can I do in the bedroom? Use night lights. Make sure that you have a light by your bed that is easy to reach. Do not use any sheets or blankets that are too big for your bed. They should not hang down onto the floor. Have a firm chair that has side arms. You can use this for support while you get dressed. Do not have throw rugs and other things on the floor that can make you trip. What can I do in the kitchen? Clean up any spills right away. Avoid walking on wet floors. Keep items that you use a lot in easy-to-reach places. If you need to reach something above you, use a strong step stool that has a grab bar. Keep electrical cords out of the way. Do not use floor polish or wax that makes floors slippery. If you must use wax, use non-skid floor wax. Do not have throw rugs and other things on the floor that can make you trip. What can I do with my stairs? Do not leave any items on the stairs. Make sure that there are handrails on both sides of the stairs and use them. Fix handrails that are broken or loose. Make sure that handrails are as long as the stairways. Check any carpeting to make sure that it is firmly  attached to the stairs. Fix any carpet that is loose or worn. Avoid having throw rugs at the top or bottom of the stairs. If you do have throw rugs, attach them to the floor with carpet tape. Make sure that you have a light switch at the top of the stairs and the bottom of the stairs. If you do not have them, ask someone to add them for you. What else can I do to help prevent falls? Wear shoes that: Do not have high heels. Have rubber bottoms. Are comfortable and fit you well. Are closed at the toe. Do not wear sandals. If you use a stepladder: Make sure that it is fully opened. Do not climb a closed stepladder. Make sure that both sides of the  stepladder are locked into place. Ask someone to hold it for you, if possible. Clearly mark and make sure that you can see: Any grab bars or handrails. First and last steps. Where the edge of each step is. Use tools that help you move around (mobility aids) if they are needed. These include: Canes. Walkers. Scooters. Crutches. Turn on the lights when you go into a dark area. Replace any light bulbs as soon as they burn out. Set up your furniture so you have a clear path. Avoid moving your furniture around. If any of your floors are uneven, fix them. If there are any pets around you, be aware of where they are. Review your medicines with your doctor. Some medicines can make you feel dizzy. This can increase your chance of falling. Ask your doctor what other things that you can do to help prevent falls. This information is not intended to replace advice given to you by your health care provider. Make sure you discuss any questions you have with your health care provider. Document Released: 05/16/2009 Document Revised: 12/26/2015 Document Reviewed: 08/24/2014 Elsevier Interactive Patient Education  2017 [employer].

## 2021-02-08 ENCOUNTER — Other Ambulatory Visit: Payer: Self-pay | Admitting: Cardiology

## 2021-03-17 DIAGNOSIS — L732 Hidradenitis suppurativa: Secondary | ICD-10-CM | POA: Diagnosis not present

## 2021-03-24 ENCOUNTER — Other Ambulatory Visit: Payer: Self-pay

## 2021-03-24 ENCOUNTER — Encounter: Payer: Self-pay | Admitting: Internal Medicine

## 2021-03-24 ENCOUNTER — Ambulatory Visit (INDEPENDENT_AMBULATORY_CARE_PROVIDER_SITE_OTHER): Payer: Medicare Other | Admitting: Internal Medicine

## 2021-03-24 VITALS — BP 128/72 | HR 71 | Temp 98.1°F | Ht 71.6 in | Wt 249.6 lb

## 2021-03-24 DIAGNOSIS — Z125 Encounter for screening for malignant neoplasm of prostate: Secondary | ICD-10-CM

## 2021-03-24 DIAGNOSIS — N281 Cyst of kidney, acquired: Secondary | ICD-10-CM

## 2021-03-24 DIAGNOSIS — I119 Hypertensive heart disease without heart failure: Secondary | ICD-10-CM | POA: Diagnosis not present

## 2021-03-24 DIAGNOSIS — E559 Vitamin D deficiency, unspecified: Secondary | ICD-10-CM

## 2021-03-24 DIAGNOSIS — Z9989 Dependence on other enabling machines and devices: Secondary | ICD-10-CM | POA: Diagnosis not present

## 2021-03-24 DIAGNOSIS — G4733 Obstructive sleep apnea (adult) (pediatric): Secondary | ICD-10-CM | POA: Diagnosis not present

## 2021-03-24 DIAGNOSIS — R7309 Other abnormal glucose: Secondary | ICD-10-CM

## 2021-03-24 LAB — POCT UA - MICROALBUMIN
Albumin/Creatinine Ratio, Urine, POC: 30
Creatinine, POC: 300 mg/dL
Microalbumin Ur, POC: 30 mg/L

## 2021-03-24 LAB — POCT URINALYSIS DIPSTICK
Bilirubin, UA: NEGATIVE
Blood, UA: NEGATIVE
Glucose, UA: NEGATIVE
Ketones, UA: NEGATIVE
Leukocytes, UA: NEGATIVE
Nitrite, UA: NEGATIVE
Protein, UA: NEGATIVE
Spec Grav, UA: 1.02 (ref 1.010–1.025)
Urobilinogen, UA: 0.2 E.U./dL
pH, UA: 7 (ref 5.0–8.0)

## 2021-03-24 NOTE — Patient Instructions (Signed)
Health Maintenance, Male Adopting a healthy lifestyle and getting preventive care are important in promoting health and wellness. Ask your health care provider about: The right schedule for you to have regular tests and exams. Things you can do on your own to prevent diseases and keep yourself healthy. What should I know about diet, weight, and exercise? Eat a healthy diet  Eat a diet that includes plenty of vegetables, fruits, low-fat dairy products, and lean protein. Do not eat a lot of foods that are high in solid fats, added sugars, or sodium.  Maintain a healthy weight Body mass index (BMI) is a measurement that can be used to identify possible weight problems. It estimates body fat based on height and weight. Your health care provider can help determine your BMI and help you achieve or maintain ahealthy weight. Get regular exercise Get regular exercise. This is one of the most important things you can do for your health. Most adults should: Exercise for at least 150 minutes each week. The exercise should increase your heart rate and make you sweat (moderate-intensity exercise). Do strengthening exercises at least twice a week. This is in addition to the moderate-intensity exercise. Spend less time sitting. Even light physical activity can be beneficial. Watch cholesterol and blood lipids Have your blood tested for lipids and cholesterol at 68 years of age, then havethis test every 5 years. You may need to have your cholesterol levels checked more often if: Your lipid or cholesterol levels are high. You are older than 68 years of age. You are at high risk for heart disease. What should I know about cancer screening? Many types of cancers can be detected early and may often be prevented. Depending on your health history and family history, you may need to have cancer screening at various ages. This may include screening for: Colorectal cancer. Prostate cancer. Skin cancer. Lung  cancer. What should I know about heart disease, diabetes, and high blood pressure? Blood pressure and heart disease High blood pressure causes heart disease and increases the risk of stroke. This is more likely to develop in people who have high blood pressure readings, are of African descent, or are overweight. Talk with your health care provider about your target blood pressure readings. Have your blood pressure checked: Every 3-5 years if you are 18-39 years of age. Every year if you are 40 years old or older. If you are between the ages of 65 and 75 and are a current or former smoker, ask your health care provider if you should have a one-time screening for abdominal aortic aneurysm (AAA). Diabetes Have regular diabetes screenings. This checks your fasting blood sugar level. Have the screening done: Once every three years after age 45 if you are at a normal weight and have a low risk for diabetes. More often and at a younger age if you are overweight or have a high risk for diabetes. What should I know about preventing infection? Hepatitis B If you have a higher risk for hepatitis B, you should be screened for this virus. Talk with your health care provider to find out if you are at risk forhepatitis B infection. Hepatitis C Blood testing is recommended for: Everyone born from 1945 through 1965. Anyone with known risk factors for hepatitis C. Sexually transmitted infections (STIs) You should be screened each year for STIs, including gonorrhea and chlamydia, if: You are sexually active and are younger than 68 years of age. You are older than 68 years of age   and your health care provider tells you that you are at risk for this type of infection. Your sexual activity has changed since you were last screened, and you are at increased risk for chlamydia or gonorrhea. Ask your health care provider if you are at risk. Ask your health care provider about whether you are at high risk for HIV.  Your health care provider may recommend a prescription medicine to help prevent HIV infection. If you choose to take medicine to prevent HIV, you should first get tested for HIV. You should then be tested every 3 months for as long as you are taking the medicine. Follow these instructions at home: Lifestyle Do not use any products that contain nicotine or tobacco, such as cigarettes, e-cigarettes, and chewing tobacco. If you need help quitting, ask your health care provider. Do not use street drugs. Do not share needles. Ask your health care provider for help if you need support or information about quitting drugs. Alcohol use Do not drink alcohol if your health care provider tells you not to drink. If you drink alcohol: Limit how much you have to 0-2 drinks a day. Be aware of how much alcohol is in your drink. In the [place] one drink equals one 12 oz bottle of beer (355 mL), one 5 oz glass of wine (148 mL), or one 1 oz glass of hard liquor (44 mL). General instructions Schedule regular health, dental, and eye exams. Stay current with your vaccines. Tell your health care provider if: You often feel depressed. You have ever been abused or do not feel safe at home. Summary Adopting a healthy lifestyle and getting preventive care are important in promoting health and wellness. Follow your health care provider's instructions about healthy diet, exercising, and getting tested or screened for diseases. Follow your health care provider's instructions on monitoring your cholesterol and blood pressure. This information is not intended to replace advice given to you by your health care provider. Make sure you discuss any questions you have with your healthcare provider. Document Revised: 07/13/2018 Document Reviewed: 07/13/2018 Elsevier Patient Education  2022 Elsevier Inc.  

## 2021-03-24 NOTE — Progress Notes (Signed)
I,Tianna Badgett,acting as a Education administrator for [name], MD.,have documented all relevant documentation on the behalf of [name], MD,as directed by  [name], MD while in the presence of [name], MD.  This visit occurred during the SARS-CoV-2 public health emergency.  Safety protocols were in place, including screening questions prior to the visit, additional usage of staff PPE, and extensive cleaning of exam room while observing appropriate contact time as indicated for disinfecting solutions.  Subjective:     [name] , male    DOB: [birth year] , 70 y.o.   MRN: [id number]   Chief Complaint  Patient presents with   Hypertension    HPI  Patient is here for a hypertension follow up. He is upset b/c he was scheduled for a physical today. However, he is scheduled 3 days too early.  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 He states that he is compliant with medications. He checks his BP regularly. He does report having palpitations on occasion. He denies having any chest pain, shortness of breath, and/or diaphoresis.   Hypertension This is a chronic problem. The current episode started more than 1 year ago. The problem has been gradually improving since onset. The problem is controlled. Pertinent negatives include no shortness of breath. Risk factors for coronary artery disease include obesity and male gender. Past treatments include diuretics, calcium channel blockers and angiotensin blockers. The current treatment provides moderate improvement.    Past Medical History:  Diagnosis Date   GERD (gastroesophageal reflux disease)    H/O: CVA (cerebrovascular accident)    Hypertension    OSA (obstructive sleep apnea)      Family History  Problem Relation Age of Onset   Cancer Mother    Stroke Father    Heart disease Father      Current Outpatient Medications:    aspirin 81 MG tablet, Take 81 mg by  mouth daily., Disp: , Rfl:    Cholecalciferol (VITAMIN D) 125 MCG (5000 UT) CAPS, Take 1 capsule by mouth daily., Disp: , Rfl:    EDARBYCLOR 40-25 MG TABS, TAKE (1) TABLET DAILY IN THE MORNING., Disp: 90 tablet, Rfl: 1   Omega-3 Fatty Acids (FISH OIL PO), Take by mouth. 1 daily, Disp: , Rfl:    Potassium Chloride ER 20 MEQ TBCR, One 1.5 tab by mouth daily, Disp: 135 tablet, Rfl: 1   spironolactone (ALDACTONE) 25 MG tablet, TAKE 1 TABLET BY MOUTH IN  THE MORNING, Disp: 90 tablet, Rfl: 3   verapamil (CALAN-SR) 240 MG CR tablet, TAKE 1 TABLET BY MOUTH  TWICE DAILY, Disp: 180 tablet, Rfl: 3   No Known Allergies   Review of Systems  Constitutional: Negative.   Respiratory: Negative.  Negative for shortness of breath.   Cardiovascular: Negative.   Gastrointestinal: Negative.   Neurological: Negative.     Today's Vitals   03/24/21 0905  BP: 128/72  Pulse: 71  Temp: 98.1 F (36.7 C)  TempSrc: Oral   There is no height or weight on file to calculate BMI.   Objective:  Physical Exam Vitals and nursing note reviewed.  Constitutional:      Appearance: Normal appearance.  HENT:     Head: Normocephalic and atraumatic.     Nose:     Comments: Masked     Mouth/Throat:     Comments: Masked  Cardiovascular:     Rate and Rhythm:  Normal rate and regular rhythm.     Heart sounds: Murmur heard.  Pulmonary:     Effort: Pulmonary effort is normal.     Breath sounds: Normal breath sounds.  Musculoskeletal:     Cervical back: Normal range of motion.  Skin:    General: Skin is warm.  Neurological:     General: No focal deficit present.     Mental Status: He is alert.  Psychiatric:        Mood and Affect: Mood normal.        Assessment And Plan:     1. Hypertensive heart disease without heart failure Comments: Chronic, well controlled. Encouraged to follow low sodium diet. Agrees to rto in Sept 2022 for physical examination. I will check u/a and microalbumin today.  - CMP14+EGFR -  CBC - Lipid panel  2. Vitamin D deficiency disease Comments: I will check vitamin D level and supplement as needed.  - Vitamin D (25 hydroxy)  3. Other abnormal glucose Comments: His a1c has been elevated in the past. I will recheck this today. He is encouraged to limit his intake of sweetened beverages.  - Hemoglobin A1c  4. Obstructive sleep apnea treated with continuous positive airway pressure (CPAP) Comments: Chronic, he reports compliance with CPAP at least 4 hours per night. He admits to feeling better while on this treatment.   5. Kidney cysts Comments: I will send him for f/u renal ultrasound. Urology note reviewed in detail during visit.   6. Prostate cancer screening Comments: I will check PSA today. I will forward his results to his urologist, Dr. Tresa Moore.  - PSA, total and free   Patient was given opportunity to ask questions. Patient verbalized understanding of the plan and was able to repeat key elements of the plan. All questions were answered to their satisfaction.   I, [name], MD, have reviewed all documentation for this visit. The documentation on 03/24/21 for the exam, diagnosis, procedures, and orders are all accurate and complete.  IF YOU HAVE BEEN REFERRED TO A SPECIALIST, IT MAY TAKE 1-2 WEEKS TO SCHEDULE/PROCESS THE REFERRAL. IF YOU HAVE NOT HEARD FROM US/SPECIALIST IN TWO WEEKS, PLEASE GIVE [name] A CALL AT [phone].   THE PATIENT IS ENCOURAGED TO PRACTICE SOCIAL DISTANCING DUE TO THE COVID-19 PANDEMIC.

## 2021-03-25 LAB — CMP14+EGFR
ALT: 20 IU/L (ref 0–44)
AST: 20 IU/L (ref 0–40)
Albumin/Globulin Ratio: 1.1 — ABNORMAL LOW (ref 1.2–2.2)
Albumin: 3.9 g/dL (ref 3.8–4.8)
Alkaline Phosphatase: 60 IU/L (ref 44–121)
BUN/Creatinine Ratio: 16 (ref 10–24)
BUN: 16 mg/dL (ref 8–27)
Bilirubin Total: 0.2 mg/dL (ref 0.0–1.2)
CO2: 22 mmol/L (ref 20–29)
Calcium: 10.1 mg/dL (ref 8.6–10.2)
Chloride: 100 mmol/L (ref 96–106)
Creatinine, Ser: 0.99 mg/dL (ref 0.76–1.27)
Globulin, Total: 3.4 g/dL (ref 1.5–4.5)
Glucose: 107 mg/dL — ABNORMAL HIGH (ref 65–99)
Potassium: 3.6 mmol/L (ref 3.5–5.2)
Sodium: 137 mmol/L (ref 134–144)
Total Protein: 7.3 g/dL (ref 6.0–8.5)
eGFR: 83 mL/min/{1.73_m2} (ref >59)

## 2021-03-25 LAB — LIPID PANEL
Chol/HDL Ratio: 2.6 ratio (ref 0.0–5.0)
Cholesterol, Total: 142 mg/dL (ref 100–199)
HDL: 54 mg/dL (ref >39)
LDL Chol Calc (NIH): 76 mg/dL (ref 0–99)
Triglycerides: 57 mg/dL (ref 0–149)
VLDL Cholesterol Cal: 12 mg/dL (ref 5–40)

## 2021-03-25 LAB — PSA, TOTAL AND FREE
PSA, Free Pct: 25 %
PSA, Free: 0.35 ng/mL
Prostate Specific Ag, Serum: 1.4 ng/mL (ref 0.0–4.0)

## 2021-03-25 LAB — HEMOGLOBIN A1C
Est. average glucose Bld gHb Est-mCnc: 123 mg/dL
Hgb A1c MFr Bld: 5.9 % — ABNORMAL HIGH (ref 4.8–5.6)

## 2021-03-25 LAB — CBC
Hematocrit: 43.9 % (ref 37.5–51.0)
Hemoglobin: 14.9 g/dL (ref 13.0–17.7)
MCH: 31.4 pg (ref 26.6–33.0)
MCHC: 33.9 g/dL (ref 31.5–35.7)
MCV: 93 fL (ref 79–97)
Platelets: 274 10*3/uL (ref 150–450)
RBC: 4.74 x10E6/uL (ref 4.14–5.80)
RDW: 13.8 % (ref 11.6–15.4)
WBC: 6.6 10*3/uL (ref 3.4–10.8)

## 2021-03-25 LAB — VITAMIN D 25 HYDROXY (VIT D DEFICIENCY, FRACTURES): Vit D, 25-Hydroxy: 108 ng/mL — ABNORMAL HIGH (ref 30.0–100.0)

## 2021-03-26 NOTE — Progress Notes (Signed)
Primary Physician/Referring:  Glendale Chard, MD  [name], male    DOB: 06-25-1951, 70 y.o.   MRN: [id number]   Chief Complaint  Patient presents with   Hypertension   Follow-up    HPI:    OZAIR PONTING  is a 70 y.o. African-American male with hypertension, history of remote stroke in 2003 with no residual defects probably related to hypertension, tobacco use disorder, obstructive sleep apnea on CPAP presents for annual visit. Presently he is doing well and denies any chest pain, shortness of breath, PND or orthopnea.    He denies any syncope, neurological weaknesses, tingling or numbness in the extremities.  He has occasional cramping in his calves at night.  He is tolerating the medications without any side effects. He continues to smoke and does not wish to quit smoking. He is using CPAP nightly.  Past Medical History:  Diagnosis Date   GERD (gastroesophageal reflux disease)    H/O: CVA (cerebrovascular accident)    Hypertension    OSA (obstructive sleep apnea)    History reviewed. No pertinent surgical history. Family History  Problem Relation Age of Onset   Cancer Mother    Stroke Father    Heart disease Father     Social History   Tobacco Use   Smoking status: Every Day    Packs/day: 0.25    Years: 30.00    Pack years: 7.50    Types: Cigarettes   Smokeless tobacco: Never   Tobacco comments:    he has cut back numbers cigs/smoked  Substance Use Topics   Alcohol use: Yes    Alcohol/week: 0.0 standard drinks    Comment: Occasional    Marital Status: Single  ROS  Review of Systems  Cardiovascular:  Negative for chest pain, dyspnea on exertion and leg swelling.  Gastrointestinal:  Negative for melena.  Objective  Blood pressure 122/68, pulse 63, temperature (!) 97.3 F (36.3 C), height 5' 11.6" (1.819 m), weight 252 lb (114.3 kg), SpO2 97 %.   Vitals with BMI 03/27/2021 03/24/2021 01/16/2021  Height 5' 11.6" 5' 11.6" '6\' 3"'$   Weight 252 lbs  249 lbs 10 oz 250 lbs  BMI 34.55 [id number]  Systolic 123XX123 0000000 (No Data)  Diastolic 68 72 (No Data)  Pulse 63 71 (No Data)     Physical Exam HENT:     Head: Atraumatic.  Cardiovascular:     Rate and Rhythm: Normal rate and regular rhythm.     Pulses: Intact distal pulses.     Heart sounds: S1 normal and S2 normal. Murmur heard.  Systolic murmur is present with a grade of 2/6 at the upper right sternal border.    No gallop.     Comments: No edema. No JVD.  Pulmonary:     Effort: Pulmonary effort is normal.     Breath sounds: Normal breath sounds.  Abdominal:     General: Bowel sounds are normal.     Palpations: Abdomen is soft.   Laboratory examination:   Recent Labs    05/07/20 1410 07/01/20 0835 09/23/20 0932 10/21/20 0841 12/03/20 1025 12/31/20 1004 03/24/21 0954  NA 139 141 139 140  --   --  137  K 3.1* 3.3* 3.2* 3.1* 3.3* 3.3* 3.6  CL 99 101 101 102  --   --  100  CO2 '24 24 23 23  '$ --   --  22  GLUCOSE 163* 96 105* 106*  --   --  107*  BUN '12 15 14 14  '$ --   --  16  CREATININE 0.95 0.91 0.94 0.96  --   --  0.99  CALCIUM 9.4 9.6 10.0 9.3  --   --  10.1  GFRNONAA 83 87 84  --   --   --   --   GFRAA 96 100 97  --   --   --   --    estimated creatinine clearance is 93.9 mL/min (by C-G formula based on SCr of 0.99 mg/dL).  CMP Latest Ref Rng & Units 03/24/2021 12/31/2020 12/03/2020  Glucose 65 - 99 mg/dL 107(H) - -  BUN 8 - 27 mg/dL 16 - -  Creatinine 0.76 - 1.27 mg/dL 0.99 - -  Sodium 134 - 144 mmol/L 137 - -  Potassium 3.5 - 5.2 mmol/L 3.6 3.3(L) 3.3(L)  Chloride 96 - 106 mmol/L 100 - -  CO2 20 - 29 mmol/L 22 - -  Calcium 8.6 - 10.2 mg/dL 10.1 - -  Total Protein 6.0 - 8.5 g/dL 7.3 - -  Total Bilirubin 0.0 - 1.2 mg/dL 0.2 - -  Alkaline Phos 44 - 121 IU/L 60 - -  AST 0 - 40 IU/L 20 - -  ALT 0 - 44 IU/L 20 - -   CBC Latest Ref Rng & Units 03/24/2021 03/26/2020 03/08/2019  WBC 3.4 - 10.8 x10E3/uL 6.6 9.3 5.8  Hemoglobin 13.0 - 17.7 g/dL 14.9 14.2 14.0  Hematocrit  37.5 - 51.0 % 43.9 42.5 41.6  Platelets 150 - 450 x10E3/uL 274 256 259    Lipid Panel Recent Labs    03/24/21 0954  CHOL 142  TRIG 57  LDLCALC 76  HDL 54  CHOLHDL 2.6    HEMOGLOBIN A1C Lab Results  Component Value Date   HGBA1C 5.9 (H) 03/24/2021   TSH Recent Labs    09/23/20 0932  TSH 0.786   Medications and allergies  No Known Allergies   Outpatient Medications Prior to Visit  Medication Sig Dispense Refill   aspirin 81 MG tablet Take 81 mg by mouth daily.     Cholecalciferol (VITAMIN D) 125 MCG (5000 UT) CAPS Take 1 capsule by mouth daily.     EDARBYCLOR 40-25 MG TABS TAKE (1) TABLET DAILY IN THE MORNING. 90 tablet 1   Omega-3 Fatty Acids (FISH OIL PO) Take by mouth. 1 daily     Potassium Chloride ER 20 MEQ TBCR One 1.5 tab by mouth daily 135 tablet 1   spironolactone (ALDACTONE) 25 MG tablet TAKE 1 TABLET BY MOUTH IN  THE MORNING 90 tablet 3   verapamil (CALAN-SR) 240 MG CR tablet TAKE 1 TABLET BY MOUTH  TWICE DAILY 180 tablet 3   No facility-administered medications prior to visit.   Radiology:   No results found.  Cardiac Studies:   Exercise sestamibi 10/16/11: Ex 5 min, 7.3 METs. Small ant infarct vs attenuation. EF 45% Dil LV. Low risk stress test. Patient asymptomatic, contineu medical therapy.    Carotid duplex 04/13/11: Mild calcific plaque left carotid artery. No stenosis    Renal duplex 04/13/11: Normal renal duplex scan. Comp to prev, right renal A. do no suggest <60% stenosis.  Echocardiogram   04/15/2017: Left ventricle cavity is normal in size. Mild concentric hypertrophy of the left ventricle. Low normal LV systolic function with global hypokinesis. Visual EF is 50-55%. Doppler evidence of grade I (impaired) diastolic dysfunction, elevated LAP. Mild tricuspid regurgitation. No evidence of pulmonary hypertension. Impaired to the study  done on 04/16/2011, EF previously was normal.  Abdominal Aortic Duplex  06/07/2019:  Normal abdominal aorta. No  evidence of aneurysm.  Incidental 1.3x1.6 heterogeneous mass right kidney apex noted, consider  dedicated US of the kidneys.  See attached image. (will be ordered)  US abdomen 09/21/2019: Simple appearing cyst off the lower pole of the left kidney. No acute findings.  No hydronephrosis. Prostate enlargement.  EKG  EKG 03/26/2021: Sinus rhythm with first-degree block at rate of 68 bpm, normal axis, poor R wave progression.  IVCD, LVH.  No significant change from 03/25/2020.  Assessment     ICD-10-CM   1. Hypertensive heart disease without heart failure  I11.9 EKG 12-Lead    2. Essential hypertension  I10 EKG 12-Lead    3. Obstructive sleep apnea treated with continuous positive airway pressure (CPAP)  G47.33    Z99.89     4. Tobacco use  Z72.0        There are no discontinued medications.   No orders of the defined types were placed in this encounter.  Recommendations:   NYKEL WIBBENMEYER is a 70 y.o. African-American male with hypertension, history of remote stroke in 2003 with no residual defects probably related to hypertension, tobacco use disorder, obstructive sleep apnea on CPAP presents for annual visit.   He is essentially asymptomatic except for occasional cramping at night, in his legs.  Physical examination is unchanged from previous, I reviewed his external labs, renal function is normal, lipids are well controlled, he does have mild hyperglycemia that is also stable.  Blood pressure is under excellent control.  No changes in the medications were done today.  I will see him back again in a year or sooner if problems.  Smoking cessation again discussed with the patient.  He has been compliant with CPAP use.   Adrian Prows, MD, [facility] 03/27/2021, 9:10 AM Office: [phone]

## 2021-03-27 ENCOUNTER — Ambulatory Visit
Admission: RE | Admit: 2021-03-27 | Discharge: 2021-03-27 | Disposition: A | Discharge status: Home / Self Care | Payer: Medicare Other | Source: Ambulatory Visit | Attending: Internal Medicine | Admitting: Internal Medicine

## 2021-03-27 ENCOUNTER — Other Ambulatory Visit: Payer: Self-pay

## 2021-03-27 ENCOUNTER — Ambulatory Visit: Payer: Medicare Other | Admitting: Cardiology

## 2021-03-27 ENCOUNTER — Encounter: Payer: Self-pay | Admitting: Cardiology

## 2021-03-27 VITALS — BP 122/68 | HR 63 | Temp 97.3°F | Ht 71.6 in | Wt 252.0 lb

## 2021-03-27 DIAGNOSIS — I10 Essential (primary) hypertension: Secondary | ICD-10-CM | POA: Diagnosis not present

## 2021-03-27 DIAGNOSIS — I119 Hypertensive heart disease without heart failure: Secondary | ICD-10-CM | POA: Diagnosis not present

## 2021-03-27 DIAGNOSIS — N281 Cyst of kidney, acquired: Secondary | ICD-10-CM | POA: Diagnosis not present

## 2021-03-27 DIAGNOSIS — Z720 Tobacco use: Secondary | ICD-10-CM | POA: Diagnosis not present

## 2021-03-27 DIAGNOSIS — G4733 Obstructive sleep apnea (adult) (pediatric): Secondary | ICD-10-CM

## 2021-03-27 DIAGNOSIS — Z9989 Dependence on other enabling machines and devices: Secondary | ICD-10-CM | POA: Diagnosis not present

## 2021-03-28 ENCOUNTER — Other Ambulatory Visit: Payer: Self-pay

## 2021-04-01 ENCOUNTER — Ambulatory Visit: Payer: Self-pay | Admitting: General Surgery

## 2021-04-01 DIAGNOSIS — K629 Disease of anus and rectum, unspecified: Secondary | ICD-10-CM | POA: Diagnosis not present

## 2021-04-01 NOTE — H&P (Signed)
History of Present Illness: The patient is a 70 year old male who presents with a complaint of anal problems. 69 year old male who presents to the office today for evaluation of an anal lesion.  He states that he has had areas of soreness around his anal canal for several months to years.  He equates them to other lesions that he has throughout his body that swell up and drain.  This did improve with antibiotics.  It then recurred.  He has a lot of anal itching as well.  He has been treated for internal hemorrhoids with rubber band ligation in the past.  He denies any rectal bleeding currently.  He was seen in the office and he was felt to have hidradenitis.  He was placed on a long course of antibiotics for this.    Past Medical History:  Diagnosis Date   GERD (gastroesophageal reflux disease)    H/O: CVA (cerebrovascular accident)    Hypertension    OSA (obstructive sleep apnea)    No past surgical history on file. Family History  Problem Relation Age of Onset   Cancer Mother    Stroke Father    Heart disease Father    Social History   Socioeconomic History   Marital status: Single    Spouse name: Not on file   Number of children: 2   Years of education: College   Highest education level: Not on file  Occupational History   Occupation: retired  Tobacco Use   Smoking status: Every Day    Packs/day: 0.25    Years: 30.00    Pack years: 7.50    Types: Cigarettes   Smokeless tobacco: Never   Tobacco comments:    he has cut back numbers cigs/smoked  Vaping Use   Vaping Use: Never used  Substance and Sexual Activity   Alcohol use: Yes    Alcohol/week: 0.0 standard drinks    Comment: Occasional    Drug use: Yes    Types: Marijuana    Comment: every once in a while   Sexual activity: Yes  Other Topics Concern   Not on file  Social History Narrative   Some caffeine use    Social Determinants of Health   Financial Resource Strain: Low Risk    Difficulty of Paying Living  Expenses: Not hard at all  Food Insecurity: No Food Insecurity   Worried About Charity fundraiser in the Last Year: Never true   [place] in the Last Year: Never true  Transportation Needs: No Transportation Needs   Lack of Transportation (Medical): No   Lack of Transportation (Non-Medical): No  Physical Activity: Inactive   Days of Exercise per Week: 0 days   Minutes of Exercise per Session: 0 min  Stress: No Stress Concern Present   Feeling of Stress : Not at all  Social Connections: Not on file  Intimate Partner Violence: Not on file   Current Outpatient Medications  Medication Instructions   aspirin 81 mg, Oral, Daily   Cholecalciferol (VITAMIN D) 125 MCG (5000 UT) CAPS 1 capsule, Oral, Daily   Cholecalciferol 100 MCG (4000 UT) CAPS Oral, daily   EDARBYCLOR 40-25 MG TABS TAKE (1) TABLET DAILY IN THE MORNING.   Omega-3 Fatty Acids (FISH OIL PO) Oral, 1 daily    Potassium Chloride ER 20 MEQ TBCR One 1.5 tab by mouth daily   spironolactone (ALDACTONE) 25 MG tablet TAKE 1 TABLET BY MOUTH IN  THE MORNING  verapamil (CALAN-SR) 240 MG CR tablet TAKE 1 TABLET BY MOUTH  TWICE DAILY   No Known Allergies Review of Systems - General ROS: negative for - chills, fatigue, or fever Respiratory ROS: no cough, shortness of breath, or wheezing Cardiovascular ROS: no chest pain or dyspnea on exertion Gastrointestinal ROS: no abdominal pain, change in bowel habits, or black or bloody stools Genito-Urinary ROS: no dysuria, trouble voiding, or hematuria     Objective:    There were no vitals filed for this visit.    General appearance - alert, well appearing, and in no distress CV: RRR Lungs: CTA Abd: soft Rectal - L lateral lesion with purlence, no fluctunace, no palpable cord    Assessment and Plan:  Diagnoses and all orders for this visit:  Anal lesion    Fistula vs hydradenitis.  I have recommended an exam under anesthesia with treatment depending on what I find.  If  hidradenitis is found, we will do a wide excision.  If a fistula is noted and appears to be shallow then we will perform a fistulotomy.  If it is deeper we will place a seton.  I have discussed all obvious scenarios in detail with the patient.  We discussed risk of surgery which include bleeding, pain and recurrence.  All questions were answered.

## 2021-04-22 ENCOUNTER — Other Ambulatory Visit: Payer: Self-pay

## 2021-04-22 ENCOUNTER — Encounter: Payer: Self-pay | Admitting: Internal Medicine

## 2021-04-22 ENCOUNTER — Ambulatory Visit (INDEPENDENT_AMBULATORY_CARE_PROVIDER_SITE_OTHER): Payer: Medicare Other | Admitting: Internal Medicine

## 2021-04-22 VITALS — BP 114/70 | HR 72 | Temp 98.9°F | Ht 71.6 in | Wt 250.4 lb

## 2021-04-22 DIAGNOSIS — Z0000 Encounter for general adult medical examination without abnormal findings: Secondary | ICD-10-CM | POA: Diagnosis not present

## 2021-04-22 DIAGNOSIS — I119 Hypertensive heart disease without heart failure: Secondary | ICD-10-CM | POA: Diagnosis not present

## 2021-04-22 DIAGNOSIS — E6609 Other obesity due to excess calories: Secondary | ICD-10-CM

## 2021-04-22 DIAGNOSIS — E66811 Obesity, class 1: Secondary | ICD-10-CM | POA: Insufficient documentation

## 2021-04-22 DIAGNOSIS — F17210 Nicotine dependence, cigarettes, uncomplicated: Secondary | ICD-10-CM

## 2021-04-22 DIAGNOSIS — G4733 Obstructive sleep apnea (adult) (pediatric): Secondary | ICD-10-CM

## 2021-04-22 DIAGNOSIS — Z9989 Dependence on other enabling machines and devices: Secondary | ICD-10-CM

## 2021-04-22 DIAGNOSIS — R7309 Other abnormal glucose: Secondary | ICD-10-CM | POA: Diagnosis not present

## 2021-04-22 DIAGNOSIS — Z6834 Body mass index (BMI) 34.0-34.9, adult: Secondary | ICD-10-CM

## 2021-04-22 NOTE — Patient Instructions (Signed)
Health Maintenance, Male Adopting a healthy lifestyle and getting preventive care are important in promoting health and wellness. Ask your health care provider about: The right schedule for you to have regular tests and exams. Things you can do on your own to prevent diseases and keep yourself healthy. What should I know about diet, weight, and exercise? Eat a healthy diet  Eat a diet that includes plenty of vegetables, fruits, low-fat dairy products, and lean protein. Do not eat a lot of foods that are high in solid fats, added sugars, or sodium. Maintain a healthy weight Body mass index (BMI) is a measurement that can be used to identify possible weight problems. It estimates body fat based on height and weight. Your health care provider can help determine your BMI and help you achieve or maintain a healthy weight. Get regular exercise Get regular exercise. This is one of the most important things you can do for your health. Most adults should: Exercise for at least 150 minutes each week. The exercise should increase your heart rate and make you sweat (moderate-intensity exercise). Do strengthening exercises at least twice a week. This is in addition to the moderate-intensity exercise. Spend less time sitting. Even light physical activity can be beneficial. Watch cholesterol and blood lipids Have your blood tested for lipids and cholesterol at 68 years of age, then have this test every 5 years. You may need to have your cholesterol levels checked more often if: Your lipid or cholesterol levels are high. You are older than 68 years of age. You are at high risk for heart disease. What should I know about cancer screening? Many types of cancers can be detected early and may often be prevented. Depending on your health history and family history, you may need to have cancer screening at various ages. This may include screening for: Colorectal cancer. Prostate cancer. Skin cancer. Lung  cancer. What should I know about heart disease, diabetes, and high blood pressure? Blood pressure and heart disease High blood pressure causes heart disease and increases the risk of stroke. This is more likely to develop in people who have high blood pressure readings, are of African descent, or are overweight. Talk with your health care provider about your target blood pressure readings. Have your blood pressure checked: Every 3-5 years if you are 18-39 years of age. Every year if you are 40 years old or older. If you are between the ages of 65 and 75 and are a current or former smoker, ask your health care provider if you should have a one-time screening for abdominal aortic aneurysm (AAA). Diabetes Have regular diabetes screenings. This checks your fasting blood sugar level. Have the screening done: Once every three years after age 45 if you are at a normal weight and have a low risk for diabetes. More often and at a younger age if you are overweight or have a high risk for diabetes. What should I know about preventing infection? Hepatitis B If you have a higher risk for hepatitis B, you should be screened for this virus. Talk with your health care provider to find out if you are at risk for hepatitis B infection. Hepatitis C Blood testing is recommended for: Everyone born from 1945 through 1965. Anyone with known risk factors for hepatitis C. Sexually transmitted infections (STIs) You should be screened each year for STIs, including gonorrhea and chlamydia, if: You are sexually active and are younger than 68 years of age. You are older than 68 years   of age and your health care provider tells you that you are at risk for this type of infection. Your sexual activity has changed since you were last screened, and you are at increased risk for chlamydia or gonorrhea. Ask your health care provider if you are at risk. Ask your health care provider about whether you are at high risk for HIV.  Your health care provider may recommend a prescription medicine to help prevent HIV infection. If you choose to take medicine to prevent HIV, you should first get tested for HIV. You should then be tested every 3 months for as long as you are taking the medicine. Follow these instructions at home: Lifestyle Do not use any products that contain nicotine or tobacco, such as cigarettes, e-cigarettes, and chewing tobacco. If you need help quitting, ask your health care provider. Do not use street drugs. Do not share needles. Ask your health care provider for help if you need support or information about quitting drugs. Alcohol use Do not drink alcohol if your health care provider tells you not to drink. If you drink alcohol: Limit how much you have to 0-2 drinks a day. Be aware of how much alcohol is in your drink. In the [place] one drink equals one 12 oz bottle of beer (355 mL), one 5 oz glass of wine (148 mL), or one 1 oz glass of hard liquor (44 mL). General instructions Schedule regular health, dental, and eye exams. Stay current with your vaccines. Tell your health care provider if: You often feel depressed. You have ever been abused or do not feel safe at home. Summary Adopting a healthy lifestyle and getting preventive care are important in promoting health and wellness. Follow your health care provider's instructions about healthy diet, exercising, and getting tested or screened for diseases. Follow your health care provider's instructions on monitoring your cholesterol and blood pressure. This information is not intended to replace advice given to you by your health care provider. Make sure you discuss any questions you have with your health care provider. Document Revised: 09/27/2020 Document Reviewed: 07/13/2018 Elsevier Patient Education  2022 [employer] Inc[employer]  

## 2021-04-22 NOTE — Progress Notes (Signed)
I,YAMILKA J Llittleton,acting as a Education administrator for [name], MD.,have documented all relevant documentation on the behalf of [name], MD,as directed by  [name], MD while in the presence of [name], MD.  This visit occurred during the SARS-CoV-2 public health emergency.  Safety protocols were in place, including screening questions prior to the visit, additional usage of staff PPE, and extensive cleaning of exam room while observing appropriate contact time as indicated for disinfecting solutions.  Subjective:     [name] , male    DOB: 08/16/1950 , 70 y.o.   MRN: [id number]   Chief Complaint  Patient presents with   Annual Exam   Hypertension    HPI  Patient here for HM.  He has his prostate exams performed by Dr. Tresa Moore at [facility]. He reports compliance with meds. He denies headaches, chest pain and shortness of breath.   Hypertension This is a chronic problem. The current episode started more than 1 year ago. The problem has been gradually improving since onset. The problem is controlled. Pertinent negatives include no blurred vision, chest pain, palpitations or shortness of breath. Risk factors for coronary artery disease include obesity, sedentary lifestyle, smoking/tobacco exposure and male gender. Past treatments include diuretics and angiotensin blockers. Compliance problems include exercise.     Past Medical History:  Diagnosis Date   GERD (gastroesophageal reflux disease)    H/O: CVA (cerebrovascular accident) 2003   Patient states the only residual deficit is an occasional stutter.   Hypertension    OSA (obstructive sleep apnea)    wears CPAP q night     Family History  Problem Relation Age of Onset   Cancer Mother    Stroke Father    Heart disease Father      Current Outpatient Medications:    aspirin 81 MG tablet, Take 81 mg by mouth daily., Disp: , Rfl:    Cholecalciferol (VITAMIN D) 125 MCG (5000 UT) CAPS,  Take 1 capsule by mouth daily., Disp: , Rfl:    EDARBYCLOR 40-25 MG TABS, TAKE (1) TABLET DAILY IN THE MORNING., Disp: 90 tablet, Rfl: 1   Omega-3 Fatty Acids (FISH OIL PO), Take by mouth. 1 daily, Disp: , Rfl:    Potassium Chloride ER 20 MEQ TBCR, One 1.5 tab by mouth daily, Disp: 135 tablet, Rfl: 1   spironolactone (ALDACTONE) 25 MG tablet, TAKE 1 TABLET BY MOUTH IN  THE MORNING, Disp: 90 tablet, Rfl: 3   verapamil (CALAN-SR) 240 MG CR tablet, TAKE 1 TABLET BY MOUTH  TWICE DAILY, Disp: 180 tablet, Rfl: 3   traMADol (ULTRAM) 50 MG tablet, Take 1-2 tablets (50-100 mg total) by mouth every 6 (six) hours as needed., Disp: 30 tablet, Rfl: 0   triamcinolone cream (KENALOG) 0.1 %, Apply 1 application topically as needed., Disp: , Rfl:    No Known Allergies   Men's preventive visit. Patient Health Questionnaire (PHQ-2) is  Flowsheet Row Clinical Support from 01/16/2021 in [facility]  PHQ-2 Total Score 0     . Patient is on a healthy diet. Marital status: Single. Relevant history for alcohol use is:  Social History   Substance and Sexual Activity  Alcohol Use Yes   Alcohol/week: 0.0 standard drinks   Comment: Occasional   . Relevant history for tobacco use is:  Social History   Tobacco Use  Smoking Status Every Day   Packs/day: 0.25   Years: 47.00   Pack years:  11.75   Types: Cigarettes   Start date: 1965  Smokeless Tobacco Never  Tobacco Comments   he has cut back numbers cigs/smoked  .   Review of Systems  Constitutional: Negative.   HENT: Negative.    Eyes: Negative.  Negative for blurred vision.  Respiratory: Negative.  Negative for shortness of breath.   Cardiovascular: Negative.  Negative for chest pain and palpitations.  Gastrointestinal: Negative.   Endocrine: Negative.   Genitourinary: Negative.   Musculoskeletal: Negative.   Skin: Negative.   Hematological: Negative.   Psychiatric/Behavioral: Negative.      Today's Vitals   04/22/21 1405   BP: 114/70  Pulse: 72  Temp: 98.9 F (37.2 C)  Weight: 250 lb 6.4 oz (113.6 kg)  Height: 5' 11.6" (1.819 m)  PainSc: 0-No pain   Body mass index is 34.34 kg/m.  Wt Readings from Last 3 Encounters:  05/29/21 249 lb 9.6 oz (113.2 kg)  05/08/21 252 lb 8 oz (114.5 kg)  04/22/21 250 lb 6.4 oz (113.6 kg)    Objective:  Physical Exam Vitals and nursing note reviewed.  Constitutional:      Appearance: Normal appearance.  HENT:     Head: Normocephalic and atraumatic.     Right Ear: Tympanic membrane, ear canal and external ear normal.     Left Ear: Tympanic membrane, ear canal and external ear normal.     Nose:     Comments: Masked     Mouth/Throat:     Comments: Masked  Eyes:     Extraocular Movements: Extraocular movements intact.     Conjunctiva/sclera: Conjunctivae normal.     Pupils: Pupils are equal, round, and reactive to light.  Cardiovascular:     Rate and Rhythm: Normal rate and regular rhythm.     Pulses: Normal pulses.     Heart sounds: Murmur heard.  Pulmonary:     Effort: Pulmonary effort is normal.     Breath sounds: Normal breath sounds.  Chest:  Breasts:    Right: Normal. No swelling, bleeding, inverted nipple, mass or nipple discharge.     Left: Normal. No swelling, bleeding, inverted nipple, mass or nipple discharge.  Abdominal:     General: Abdomen is flat. Bowel sounds are normal.     Palpations: Abdomen is soft.  Genitourinary:    Comments: Deferred  Musculoskeletal:        General: Normal range of motion.     Cervical back: Normal range of motion and neck supple.  Skin:    General: Skin is warm.  Neurological:     General: No focal deficit present.     Mental Status: He is alert.  Psychiatric:        Mood and Affect: Mood normal.        Behavior: Behavior normal.        Assessment And Plan:    1. Encounter for general adult medical examination w/o abnormal findings Comments: A full exam was performed. DRE deferred. PATIENT IS ADVISED TO  GET 30-45 MINUTES REGULAR EXERCISE NO LESS THAN FOUR TO FIVE DAYS PER WEEK - BOTH WEIGHTBEARING EXERCISES AND AEROBIC ARE RECOMMENDED.  PATIENT IS ADVISED TO FOLLOW A HEALTHY DIET WITH AT LEAST SIX FRUITS/VEGGIES PER DAY, DECREASE INTAKE OF RED MEAT, AND TO INCREASE FISH INTAKE TO TWO DAYS PER WEEK.  MEATS/FISH SHOULD NOT BE FRIED, BAKED OR BROILED IS PREFERABLE.  IT IS ALSO IMPORTANT TO CUT BACK ON YOUR SUGAR INTAKE. PLEASE AVOID ANYTHING WITH ADDED SUGAR, CORN  SYRUP OR OTHER SWEETENERS. IF YOU MUST USE A SWEETENER, YOU CAN TRY STEVIA. IT IS ALSO IMPORTANT TO AVOID ARTIFICIALLY SWEETENERS AND DIET BEVERAGES. LASTLY, I SUGGEST WEARING SPF 50 SUNSCREEN ON EXPOSED PARTS AND ESPECIALLY WHEN IN THE DIRECT SUNLIGHT FOR AN EXTENDED PERIOD OF TIME.  PLEASE AVOID FAST FOOD RESTAURANTS AND INCREASE YOUR WATER INTAKE.  2. Hypertensive heart disease without heart failure Comments: Chronic, well controlled. He is encouraged to follow low sodium diet. Cardiology input appreciated, encouraged to live heart healthy lifestyle. He will rto in six months for re-evaluation.   3. Other abnormal glucose Comments: His a1c has been elevated in the past. I will recheck this at his next visit. Encouraged to limit intake of sweetened beverages.  4. Obstructive sleep apnea treated with continuous positive airway pressure (CPAP) Comments: Chronic, importance of CPAP compliance was discussed with the patient. Encouraged to wear CPAP at least 4 hours per night.   5. Cigarette nicotine dependence without complication Smoking cessation instruction/counseling given:  counseled patient on the dangers of tobacco use, advised patient to stop smoking, and reviewed strategies to maximize success.  6. Class 1 obesity due to excess calories with serious comorbidity and body mass index (BMI) of 34.0 to 34.9 in adult Comments: He is encouraged to strive for BMI less than 30 to decrease cardiac risk. Advised to aim for at least 150 minutes of  exercise per week.   Patient was given opportunity to ask questions. Patient verbalized understanding of the plan and was able to repeat key elements of the plan. All questions were answered to their satisfaction.   I, [name], MD, have reviewed all documentation for this visit. The documentation on 04/22/21 for the exam, diagnosis, procedures, and orders are all accurate and complete.  THE PATIENT IS ENCOURAGED TO PRACTICE SOCIAL DISTANCING DUE TO THE COVID-19 PANDEMIC.

## 2021-05-07 NOTE — Patient Instructions (Addendum)
Please continue using your CPAP regularly. While your insurance requires that you use CPAP at least 4 hours each night on 70% of the nights, I recommend, that you not skip any nights and use it throughout the night if you can. Getting used to CPAP and staying with the treatment long term does take time and patience and discipline. Untreated obstructive sleep apnea when it is moderate to severe can have an adverse impact on cardiovascular health and raise her risk for heart disease, arrhythmias, hypertension, congestive heart failure, stroke and diabetes. Untreated obstructive sleep apnea causes sleep disruption, nonrestorative sleep, and sleep deprivation. This can have an impact on your day to day functioning and cause daytime sleepiness and impairment of cognitive function, memory loss, mood disturbance, and problems focussing. Using CPAP regularly can improve these symptoms.  Let me know if you need a new machine!  Follow up in 1 year

## 2021-05-07 NOTE — Progress Notes (Signed)
PATIENT: Terry Andrews DOB: 05/09/1951  REASON FOR VISIT: follow up HISTORY FROM: patient  Chief Complaint  Patient presents with   Obstructive Sleep Apnea    RM 2, alone. Here for yearly CPAP f/u. Pt reports doing well on cpap. No issues or concerns.       HISTORY OF PRESENT ILLNESS: 05/08/21 ALL: Terry Andrews returns for follow up for OSA on CPAP. He continues to do very well on CPAP therapy. He is using his machine every night. He feels better rested when using CPAP. He denies concerns with machine or supplies.   Set up date 01/2015. He feels his machine is working just fine and does not want to order a new one.     05/08/2020 ALL:  Terry Andrews is a 70 y.o. male here today for follow up for OSA on CPAP.  He continues to do very well with CPAP therapy.  He reports that he cannot sleep without his machine.  He did recently receive a new CPAP as his previous machine was shattered after dropping it in the bathroom.  He denies any difficulty with his new CPAP machine.  Compliance report dated 04/06/2020 through 05/05/2020 reveals that he used CPAP 30 of the past 30 days for compliance of 100%.  He used CPAP greater than 4 hours all 30 days.  Average usage was 7 hours and 22 minutes.  Residual AHI was 0.4 on 5 to 15 cm of water and EPR of 3.  There was a leak noted in the 95th percentile of 26.2 L/min.  He is using nasal pillows.  He does switch these out frequently as he continues to note leak at home.  He is not bothered by the sleep.    HISTORY: (copied from Dr Guadelupe Sabin note on 05/08/2019)  Terry Andrews is a very pleasant 70 year old right-handed gentleman with an underlying medical history of reflux disease, hypertension, smoking, heart disease, remote Hx of right pontine stroke in 2003, remote history of substance abuse (2003), and obesity, who presents for follow-up consultation of his OSA, on treatment with AutoPap therapy. The patient is unaccompanied today and presents for his yearly  checkup.  I last saw him on 05/05/2018, at which time he was compliant with his AutoPap therapy.  He had a recent stressor as his fianc had a stroke. He was advised to follow-up in 1 year.   Today, 05/08/2019: I reviewed his AutoPap compliance data from 04/03/2019 through 05/02/2019 which is a total of 30 days, during which time he used his AutoPap 28 days with percent use days greater than 4 hours at 93%, indicating excellent compliance with an average usage of 8 hours and 20 minutes, residual AHI at goal at 0.3/h, 95th percentile of pressure at 8.2 cm with a range of 5 cm to 15 cm with EPR.  Leak on the high side with a 95th percentile at 25 L/min.  He reports doing well, notices less good sleep when he skips his AutoPap.  Generally, he is very compliant with treatment, uses nasal pillows with good success, is not particularly bothered by air leakage.  He is up-to-date with his supplies through [facility].  He saw his cardiologist recently and was started on potassium.    Previously:    I saw him on 05/05/2016, at which time he was compliant with his AutoPap. He was advised to be fully compliant with AutoPap, and encouraged to quit smoking cigarettes and Marijuana, and limit his alcohol intake.  He saw Cecille Rubin, nurse practitioner in the interim on 05/05/2017, at which time he was compliant with his AutoPap and advised to follow-up in one year routinely.   I reviewed his AutoPap compliance data from 04/03/2018 through 05/02/2018 which is a total of 30 days, during which time he used his AutoPap 28 days with percent used days greater than 4 hours at 90%, indicating excellent compliance with an average usage of 8 hours and 8 minutes, residual AHI at goal at 0.4 per hour, leak high with the 95th percentile at 28.1 L/m, 95th percentile of pressure at 7.4 cm with a range of 5 cm to 15 cm with EPR.    I saw him on 05/06/2015, at which time he reported doing well. He liked his new CPAP machine.  He had some issues with knee pain and back pain. He was seen a chiropractor for this. His cardiologist wanted to see him in one year. He was not quite ready to quit smoking. He was fully compliant with CPAP therapy.   05/05/2016: I reviewed his autoPAP compliance data from 04/03/2016 through 05/02/2016 which is a total of 30 days, during which time he used his machine 29 days with percent used days greater than 4 hours at 97%, indicating excellent compliance with an average usage of 7 hours and 15 minutes, residual AHI low at 0.3 per hour. 95th percentile pressure at 8.6 cm, leak acceptable with the 95th percentile at 19.1 L/m, pressure setting of 5-15 cm with EPR.       I saw him on 01/04/2015, at which time he reported feeling about the same. He did not bring his machine or SD card. He had no new sleep related complaints. He had lost a few pounds. He was feeling better with CPAP and was using his old machine. I suggested we start him on AutoPap therapy and provide him with a new machine. He reported that his brother has severe OSA and uses a CPAP too.    I reviewed his CPAP compliance data from 04/01/2015 through 04/30/2015 which is a total of 30 days during which time he used his machine every night except for 1, with percent used days greater than 4 hours at 97%, indicating excellent compliance with an average usage of 6 hours and 38 minutes, residual AHI low at 0.5 per hour, leak acceptable with the 95th percentile at 21.9 L/m, borderline, and 95th percentile of pressure at 8.8 cm, setting of AutoPap pap with a range of 5-15 cm with EPR.     I first met him on 11/15/2014 at the request of his cardiologist, at which time the patient reported a prior diagnosis of obstructive sleep apnea and treatment with CPAP for the past 10 years. He had noticed problems with his machine. Prior sleep test results were not available for me. His sleep study was over 10 years ago. I suggested he return for a potential  split-night sleep study. He ended up having a baseline sleep study on 11/22/2014 and I went over his test results with him in detail today. His sleep efficiency was reduced markedly at 53%. Of note the patient had difficulty maintaining sleep and particularly had a hard time going back to sleep after 2:15 AM. Sleep latency was normal but wake after sleep onset was nearly 3 hours. There was mild to moderate sleep fragmentation. He had an increased percentage of stage II sleep, and a decreased percentage of REM sleep at 9.7%. He had no significant PLMS. He  had occasional PVCs on single-lead EKG. He had mild intermittent snoring. Total AHI was borderline at 4.5 per hour, rising to 32.2 per hour during REM sleep and 6.7 per hour in the supine position. He slept mostly in the supine position. Average oxygen saturation was 90% but there were faulty readings as well on the pulse oximeter throughout the night.      He was diagnosed with obstructive sleep apnea about 10 years ago. He was placed on CPAP therapy. He has been compliant with treatment. About 2 or 3 months ago he started noticing that the machine was making a lot of noise and he actually quit using it. You encouraged him to restart using it and he also had a bad cough and bout of congestion which since then has improved but it made it hard for him to be compliant with CPAP therapy at the time. All in all he estimates that he has over the course of 10 years always been compliant with treatment about 85% of the time. He uses nasal pillows. He did not bring his machine today.   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 He is single. He has 2 grown daughters. He is the youngest of a total of 7 children. One sister died of breast cancer. Another sister has breast cancer. His brother has sleep apnea and encouraged him to get tested in the past. He still smokes and is not motivated to quit. He lost some  weight with your encouragement. He does not have a set bedtime and wake time routine. He drinks caffeine in the form of coffee, tea and sodas throughout the day. He has a TV on at night throughout the night typically. He likes to sleep on his sides. He likes to take a nap in the afternoons. He is not known to have any parasomnias. He denies restless leg symptoms, morning headaches and nocturia. He wakes up typically quite well rested when he is able to use CPAP but again lately, his machine has been defective.   His DME company is [facility].    Prior sleep test results are not available for my review today. I reviewed your office note from 10/15/2014 which you kindly included. Prior test results were also reviewed including carotid Doppler studies from 04/13/2011 which showed no significant stenosis of either internal carotid artery. Echocardiogram from 04/16/2011 showed normal EF, moderate LVH, grade 2 diastolic dysfunction. According to your office note he was diagnosed with obstructive sleep apnea on 05/21/2015 with a sleep study. Labs from 06/07/2014 showed normal TSH, A1c of 6.6, total cholesterol of 130, LDL at 71, CMP and CBC normal. EKG from 05/03/2014 showed normal sinus rhythm with borderline first-degree AV block and some PVCs. His CPAP has been nearly 68 years old and he has not had re-evaluation of his OSA and nearly 10 years.   REVIEW OF SYSTEMS: Out of a complete 14 system review of symptoms, the patient complains only of the following symptoms, none and all other reviewed systems are negative.  ESS: 8  ALLERGIES: No Known Allergies  HOME MEDICATIONS: Outpatient Medications Prior to Visit  Medication Sig Dispense Refill   aspirin 81 MG tablet Take 81 mg by mouth daily.     Cholecalciferol (VITAMIN D) 125 MCG (5000 UT) CAPS Take 1 capsule by mouth daily.     EDARBYCLOR 40-25 MG TABS TAKE (1) TABLET DAILY IN THE MORNING. [place]  tablet 1   Omega-3 Fatty Acids (FISH OIL PO) Take  by mouth. 1 daily     Potassium Chloride ER 20 MEQ TBCR One 1.5 tab by mouth daily 135 tablet 1   spironolactone (ALDACTONE) 25 MG tablet TAKE 1 TABLET BY MOUTH IN  THE MORNING 90 tablet 3   verapamil (CALAN-SR) 240 MG CR tablet TAKE 1 TABLET BY MOUTH  TWICE DAILY 180 tablet 3   Cholecalciferol 100 MCG (4000 UT) CAPS Take by mouth. daily     No facility-administered medications prior to visit.    PAST MEDICAL HISTORY: Past Medical History:  Diagnosis Date   GERD (gastroesophageal reflux disease)    H/O: CVA (cerebrovascular accident)    Hypertension    OSA (obstructive sleep apnea)     PAST SURGICAL HISTORY: History reviewed. No pertinent surgical history.  FAMILY HISTORY: Family History  Problem Relation Age of Onset   Cancer Mother    Stroke Father    Heart disease Father     SOCIAL HISTORY: Social History   Socioeconomic History   Marital status: Single    Spouse name: Not on file   Number of children: 2   Years of education: College   Highest education level: Not on file  Occupational History   Occupation: retired  Tobacco Use   Smoking status: Every Day    Packs/day: 0.25    Years: 47.00    Pack years: 11.75    Types: Cigarettes    Start date: 1965   Smokeless tobacco: Never   Tobacco comments:    he has cut back numbers cigs/smoked  Vaping Use   Vaping Use: Never used  Substance and Sexual Activity   Alcohol use: Yes    Alcohol/week: 0.0 standard drinks    Comment: Occasional    Drug use: Yes    Types: Marijuana    Comment: every once in a while   Sexual activity: Yes  Other Topics Concern   Not on file  Social History Narrative   Some caffeine use    Social Determinants of Health   Financial Resource Strain: Low Risk    Difficulty of Paying Living Expenses: Not hard at all  Food Insecurity: No Food Insecurity   Worried About Charity fundraiser in the Last Year: Never true   [place] in the Last Year: Never true  Transportation  Needs: No Transportation Needs   Lack of Transportation (Medical): No   Lack of Transportation (Non-Medical): No  Physical Activity: Inactive   Days of Exercise per Week: 0 days   Minutes of Exercise per Session: 0 min  Stress: No Stress Concern Present   Feeling of Stress : Not at all  Social Connections: Not on file  Intimate Partner Violence: Not on file      PHYSICAL EXAM  Vitals:   05/08/21 0909  BP: 111/65  Pulse: 79  Weight: 252 lb 8 oz (114.5 kg)  Height: '5\' 11"'  (1.803 m)    Body mass index is 35.22 kg/m.  Generalized: Well developed, in no acute distress  Cardiology: normal rate and rhythm, no murmur noted Respiratory: clear to auscultation bilaterally  Neurological examination  Mentation: Alert oriented to time, place, history taking. Follows all commands speech and language fluent Cranial nerve II-XII: Pupils were equal round reactive to light. Extraocular movements were full, visual field were full  Motor: The motor testing reveals 5 over 5 strength of all 4 extremities. Good symmetric motor tone is noted  throughout.  Gait and station: Gait is normal.   DIAGNOSTIC DATA (LABS, IMAGING, TESTING) - I reviewed patient records, labs, notes, testing and imaging myself where available.  No flowsheet data found.   Lab Results  Component Value Date   WBC 6.6 03/24/2021   HGB 14.9 03/24/2021   HCT 43.9 03/24/2021   MCV 93 03/24/2021   PLT 274 03/24/2021      Component Value Date/Time   NA 137 03/24/2021 0954   K 3.6 03/24/2021 0954   CL 100 03/24/2021 0954   CO2 22 03/24/2021 0954   GLUCOSE 107 (H) 03/24/2021 0954   BUN 16 03/24/2021 0954   CREATININE 0.99 03/24/2021 0954   CALCIUM 10.1 03/24/2021 0954   PROT 7.3 03/24/2021 0954   ALBUMIN 3.9 03/24/2021 0954   AST 20 03/24/2021 0954   ALT 20 03/24/2021 0954   ALKPHOS 60 03/24/2021 0954   BILITOT 0.2 03/24/2021 0954   GFRNONAA 84 09/23/2020 0932   GFRAA 97 09/23/2020 0932   Lab Results  Component  Value Date   CHOL 142 03/24/2021   HDL 54 03/24/2021   LDLCALC 76 03/24/2021   TRIG 57 03/24/2021   CHOLHDL 2.6 03/24/2021   Lab Results  Component Value Date   HGBA1C 5.9 (H) 03/24/2021   No results found for: VITAMINB12 Lab Results  Component Value Date   TSH 0.786 09/23/2020       ASSESSMENT AND PLAN 70 y.o. year old male  has a past medical history of GERD (gastroesophageal reflux disease), H/O: CVA (cerebrovascular accident), Hypertension, and OSA (obstructive sleep apnea). here with     ICD-10-CM   1. Obstructive sleep apnea treated with continuous positive airway pressure (CPAP)  G47.33 For home use only DME continuous positive airway pressure (CPAP)   Z99.89        Makarios is doing very well on CPAP therapy.  Compliance report reveals excellent compliance.  We have discussed concerns of continued leak.  He is not bothered by this and residual AHI is well managed.  He was encouraged to continue using CPAP nightly and for greater than 4 hours each night. We have discussed lifespan of most CPAP machines. He feels that his machine is working well and will call me with any concerns or if he wishes to get a new CPAP.  He will follow-up with [name] in 1 year, sooner if needed.  He verbalizes understanding and agreement with this plan.   Orders Placed This Encounter  Procedures   For home use only DME continuous positive airway pressure (CPAP)    Supplies    Order Specific Question:   Length of Need    Answer:   Lifetime    Order Specific Question:   Patient has OSA or probable OSA    Answer:   Yes    Order Specific Question:   Is the patient currently using CPAP in the home    Answer:   Yes    Order Specific Question:   Settings    Answer:   Other see comments    Order Specific Question:   CPAP supplies needed    Answer:   Mask, headgear, cushions, filters, heated tubing and water chamber      No orders of the defined types were placed in this encounter.     Debbora Presto,  FNP-C 05/08/2021, 9:44 AM [facility] [address], [place], [place] [address] [phone]

## 2021-05-08 ENCOUNTER — Ambulatory Visit: Payer: Medicare Other | Admitting: Family Medicine

## 2021-05-08 ENCOUNTER — Encounter: Payer: Self-pay | Admitting: Family Medicine

## 2021-05-08 VITALS — BP 111/65 | HR 79 | Ht 71.0 in | Wt 252.5 lb

## 2021-05-08 DIAGNOSIS — Z9989 Dependence on other enabling machines and devices: Secondary | ICD-10-CM

## 2021-05-08 DIAGNOSIS — G4733 Obstructive sleep apnea (adult) (pediatric): Secondary | ICD-10-CM

## 2021-05-08 NOTE — Progress Notes (Signed)
CPAP order faxed to DME: [facility]: [phone] F: [phone]  Received fax confirmation

## 2021-05-22 ENCOUNTER — Encounter (HOSPITAL_BASED_OUTPATIENT_CLINIC_OR_DEPARTMENT_OTHER): Payer: Self-pay | Admitting: General Surgery

## 2021-05-22 ENCOUNTER — Other Ambulatory Visit: Payer: Self-pay

## 2021-05-22 NOTE — Progress Notes (Signed)
Spoke w/ via phone for pre-op interview---patient Lab needs dos----ISTAT               Lab results------03/26/21 EKG in chart COVID test -----patient states asymptomatic no test needed Arrive at -------0530 on 05/29/21 NPO after MN NO Solid Food.  Clear liquids from MN until---0430 Med rec completed Medications to take morning of surgery -----Verapamil Diabetic medication -----n/a Patient instructed no nail polish to be worn day of surgery Patient instructed to bring photo id and insurance card day of surgery Patient aware to have Driver (ride ) / caregiver    for 24 hours after surgery - wife [facility] Patient Special Instructions -----Bring CPAP. Pre-Op special Istructions -----none Patient verbalized understanding of instructions that were given at this phone interview. Patient denies shortness of breath, chest pain, fever, cough at this phone interview.   Patient current with cardiology visit. Last OV was 03/27/21 with Dr.Gangi. 03/26/21 EKG showed 1st degree heart block & poor R wave progression. Reviewed with Dr. Smith Robert, MDA. OK to proceed with surgery at [facility].

## 2021-05-28 NOTE — Anesthesia Preprocedure Evaluation (Addendum)
Anesthesia Evaluation  Patient identified by MRN, date of birth, ID band Patient awake    Reviewed: Allergy & Precautions, NPO status , Patient's Chart, lab work & pertinent test results  History of Anesthesia Complications Negative for: history of anesthetic complications  Airway Mallampati: II  TM Distance: >3 FB Neck ROM: Full    Dental no notable dental hx. (+) Dental Advisory Given   Pulmonary sleep apnea and Continuous Positive Airway Pressure Ventilation , Current Smoker and Patient abstained from smoking.,    Pulmonary exam normal        Cardiovascular hypertension, Pt. on medications negative cardio ROS Normal cardiovascular exam     Neuro/Psych CVA, No Residual Symptoms    GI/Hepatic Neg liver ROS, GERD  ,  Endo/Other  negative endocrine ROS  Renal/GU negative Renal ROS     Musculoskeletal negative musculoskeletal ROS (+)   Abdominal   Peds  Hematology negative hematology ROS (+)   Anesthesia Other Findings   Reproductive/Obstetrics                            Anesthesia Physical Anesthesia Plan  ASA: 3  Anesthesia Plan: MAC   Post-op Pain Management:    Induction:   PONV Risk Score and Plan: 1 and Ondansetron and Propofol infusion  Airway Management Planned: Natural Airway  Additional Equipment:   Intra-op Plan:   Post-operative Plan:   Informed Consent: I have reviewed the patients History and Physical, chart, labs and discussed the procedure including the risks, benefits and alternatives for the proposed anesthesia with the patient or authorized representative who has indicated his/her understanding and acceptance.     Dental advisory given  Plan Discussed with: Anesthesiologist and CRNA  Anesthesia Plan Comments:        Anesthesia Quick Evaluation

## 2021-05-29 ENCOUNTER — Encounter (HOSPITAL_BASED_OUTPATIENT_CLINIC_OR_DEPARTMENT_OTHER): Payer: Self-pay | Admitting: General Surgery

## 2021-05-29 ENCOUNTER — Ambulatory Visit (HOSPITAL_BASED_OUTPATIENT_CLINIC_OR_DEPARTMENT_OTHER): Payer: Medicare Other | Admitting: Anesthesiology

## 2021-05-29 ENCOUNTER — Ambulatory Visit (HOSPITAL_BASED_OUTPATIENT_CLINIC_OR_DEPARTMENT_OTHER)
Admission: RE | Admit: 2021-05-29 | Discharge: 2021-05-29 | Disposition: A | Discharge status: Home / Self Care | Payer: Medicare Other | Source: Ambulatory Visit | Attending: General Surgery | Admitting: General Surgery

## 2021-05-29 ENCOUNTER — Encounter (HOSPITAL_BASED_OUTPATIENT_CLINIC_OR_DEPARTMENT_OTHER)
Admission: RE | Disposition: A | Discharge status: Home / Self Care | Payer: Self-pay | Source: Ambulatory Visit | Attending: General Surgery

## 2021-05-29 DIAGNOSIS — K219 Gastro-esophageal reflux disease without esophagitis: Secondary | ICD-10-CM | POA: Diagnosis not present

## 2021-05-29 DIAGNOSIS — F17210 Nicotine dependence, cigarettes, uncomplicated: Secondary | ICD-10-CM | POA: Diagnosis not present

## 2021-05-29 DIAGNOSIS — I10 Essential (primary) hypertension: Secondary | ICD-10-CM | POA: Diagnosis not present

## 2021-05-29 DIAGNOSIS — L732 Hidradenitis suppurativa: Secondary | ICD-10-CM | POA: Diagnosis not present

## 2021-05-29 DIAGNOSIS — Z7982 Long term (current) use of aspirin: Secondary | ICD-10-CM | POA: Insufficient documentation

## 2021-05-29 DIAGNOSIS — G4733 Obstructive sleep apnea (adult) (pediatric): Secondary | ICD-10-CM | POA: Diagnosis not present

## 2021-05-29 DIAGNOSIS — K6289 Other specified diseases of anus and rectum: Secondary | ICD-10-CM | POA: Diagnosis not present

## 2021-05-29 DIAGNOSIS — K629 Disease of anus and rectum, unspecified: Secondary | ICD-10-CM | POA: Insufficient documentation

## 2021-05-29 DIAGNOSIS — Z79899 Other long term (current) drug therapy: Secondary | ICD-10-CM | POA: Insufficient documentation

## 2021-05-29 DIAGNOSIS — Z9989 Dependence on other enabling machines and devices: Secondary | ICD-10-CM | POA: Diagnosis not present

## 2021-05-29 HISTORY — PX: ANAL FISTULOTOMY: SHX6423

## 2021-05-29 LAB — POCT I-STAT, CHEM 8
BUN: 14 mg/dL (ref 8–23)
Calcium, Ion: 1.33 mmol/L (ref 1.15–1.40)
Chloride: 101 mmol/L (ref 98–111)
Creatinine, Ser: 1.1 mg/dL (ref 0.61–1.24)
Glucose, Bld: 115 mg/dL — ABNORMAL HIGH (ref 70–99)
HCT: 43 % (ref 39.0–52.0)
Hemoglobin: 14.6 g/dL (ref 13.0–17.0)
Potassium: 2.9 mmol/L — ABNORMAL LOW (ref 3.5–5.1)
Sodium: 143 mmol/L (ref 135–145)
TCO2: 29 mmol/L (ref 22–32)

## 2021-05-29 SURGERY — EXAM UNDER ANESTHESIA
Anesthesia: Monitor Anesthesia Care | Site: Rectum

## 2021-05-29 MED ORDER — 0.9 % SODIUM CHLORIDE (POUR BTL) OPTIME
TOPICAL | Status: DC | PRN
  Administered 2021-05-29: 500 mL

## 2021-05-29 MED ORDER — FENTANYL CITRATE (PF) 100 MCG/2ML IJ SOLN
INTRAMUSCULAR | Status: AC
  Filled 2021-05-29: qty 2

## 2021-05-29 MED ORDER — ACETAMINOPHEN 500 MG PO TABS
ORAL_TABLET | ORAL | Status: AC
  Filled 2021-05-29: qty 2

## 2021-05-29 MED ORDER — KETAMINE HCL 50 MG/5ML IJ SOSY
PREFILLED_SYRINGE | INTRAMUSCULAR | Status: AC
  Filled 2021-05-29: qty 5

## 2021-05-29 MED ORDER — FENTANYL CITRATE (PF) 250 MCG/5ML IJ SOLN: INTRAMUSCULAR | Status: DC | PRN

## 2021-05-29 MED ORDER — PHENYLEPHRINE 40 MCG/ML (10ML) SYRINGE FOR IV PUSH (FOR BLOOD PRESSURE SUPPORT)
PREFILLED_SYRINGE | INTRAVENOUS | Status: DC | PRN
  Administered 2021-05-29 (×2): 80 ug via INTRAVENOUS

## 2021-05-29 MED ORDER — ACETAMINOPHEN 500 MG PO TABS
1000.0000 mg | ORAL_TABLET | Freq: Once | ORAL | Status: AC
  Administered 2021-05-29: 1000 mg via ORAL

## 2021-05-29 MED ORDER — MIDAZOLAM HCL 2 MG/2ML IJ SOLN
INTRAMUSCULAR | Status: AC
  Filled 2021-05-29: qty 2

## 2021-05-29 MED ORDER — MIDAZOLAM HCL 5 MG/5ML IJ SOLN
INTRAMUSCULAR | Status: DC | PRN
  Administered 2021-05-29: 2 mg via INTRAVENOUS

## 2021-05-29 MED ORDER — PROPOFOL 10 MG/ML IV BOLUS
INTRAVENOUS | Status: AC
  Filled 2021-05-29: qty 20

## 2021-05-29 MED ORDER — PROPOFOL 10 MG/ML IV BOLUS
INTRAVENOUS | Status: DC | PRN
  Administered 2021-05-29: 20 mg via INTRAVENOUS

## 2021-05-29 MED ORDER — PROPOFOL 500 MG/50ML IV EMUL
INTRAVENOUS | Status: DC | PRN
  Administered 2021-05-29: 200 ug/kg/min via INTRAVENOUS

## 2021-05-29 MED ORDER — FENTANYL CITRATE (PF) 100 MCG/2ML IJ SOLN
INTRAMUSCULAR | Status: DC | PRN
  Administered 2021-05-29: 50 ug via INTRAVENOUS

## 2021-05-29 MED ORDER — LACTATED RINGERS IV SOLN: INTRAVENOUS | Status: DC

## 2021-05-29 MED ORDER — PROPOFOL 500 MG/50ML IV EMUL
INTRAVENOUS | Status: AC
  Filled 2021-05-29: qty 50

## 2021-05-29 MED ORDER — ONDANSETRON HCL 4 MG/2ML IJ SOLN
INTRAMUSCULAR | Status: DC | PRN
  Administered 2021-05-29: 4 mg via INTRAVENOUS

## 2021-05-29 MED ORDER — BUPIVACAINE-EPINEPHRINE 0.5% -1:200000 IJ SOLN
INTRAMUSCULAR | Status: DC | PRN
  Administered 2021-05-29: 40 mL

## 2021-05-29 MED ORDER — TRAMADOL HCL 50 MG PO TABS: 50.0000 mg | ORAL_TABLET | Freq: Four times a day (QID) | ORAL | 0 refills | Status: DC | PRN

## 2021-05-29 MED ORDER — SODIUM CHLORIDE 0.9% FLUSH: 3.0000 mL | Freq: Two times a day (BID) | INTRAVENOUS | Status: DC

## 2021-05-29 MED ORDER — PROMETHAZINE HCL 25 MG/ML IJ SOLN: 6.2500 mg | INTRAMUSCULAR | Status: DC | PRN

## 2021-05-29 MED ORDER — AMISULPRIDE (ANTIEMETIC) 5 MG/2ML IV SOLN: 10.0000 mg | Freq: Once | INTRAVENOUS | Status: DC | PRN

## 2021-05-29 MED ORDER — FENTANYL CITRATE (PF) 100 MCG/2ML IJ SOLN: 25.0000 ug | INTRAMUSCULAR | Status: DC | PRN

## 2021-05-29 SURGICAL SUPPLY — 37 items
APL SKNCLS STERI-STRIP NONHPOA (GAUZE/BANDAGES/DRESSINGS) ×2
BENZOIN TINCTURE PRP APPL 2/3 (GAUZE/BANDAGES/DRESSINGS) ×4 IMPLANT
COVER BACK TABLE 60X90IN (DRAPES) ×2 IMPLANT
COVER MAYO STAND STRL (DRAPES) ×2 IMPLANT
DECANTER SPIKE VIAL GLASS SM (MISCELLANEOUS) ×2 IMPLANT
DRAPE LAPAROTOMY 100X72 PEDS (DRAPES) ×2 IMPLANT
DRAPE UTILITY XL STRL (DRAPES) ×2 IMPLANT
DRSG PAD ABDOMINAL 8X10 ST (GAUZE/BANDAGES/DRESSINGS) ×2 IMPLANT
ELECT REM PT RETURN 9FT ADLT (ELECTROSURGICAL) ×2
ELECTRODE REM PT RTRN 9FT ADLT (ELECTROSURGICAL) ×1 IMPLANT
GAUZE 4X4 16PLY ~~LOC~~+RFID DBL (SPONGE) ×2 IMPLANT
GAUZE SPONGE 4X4 12PLY STRL (GAUZE/BANDAGES/DRESSINGS) ×2 IMPLANT
GLOVE SURG ENC MOIS LTX SZ6 (GLOVE) ×1 IMPLANT
GLOVE SURG ENC MOIS LTX SZ6.5 (GLOVE) ×2 IMPLANT
GLOVE SURG UNDER LTX SZ6.5 (GLOVE) ×2 IMPLANT
GLOVE SURG UNDER POLY LF SZ6 (GLOVE) ×1 IMPLANT
GLOVE SURG UNDER POLY LF SZ7 (GLOVE) ×2 IMPLANT
GOWN STRL REUS W/TWL LRG LVL3 (GOWN DISPOSABLE) ×2 IMPLANT
GOWN STRL REUS W/TWL XL LVL3 (GOWN DISPOSABLE) ×2 IMPLANT
IV CATH 14GX2 1/4 (CATHETERS) ×2 IMPLANT
IV CATH 18G SAFETY (IV SOLUTION) ×2 IMPLANT
KIT TURNOVER CYSTO (KITS) ×2 IMPLANT
NEEDLE HYPO 22GX1.5 SAFETY (NEEDLE) ×2 IMPLANT
NS IRRIG 500ML POUR BTL (IV SOLUTION) ×2 IMPLANT
PACK BASIN DAY SURGERY FS (CUSTOM PROCEDURE TRAY) ×2 IMPLANT
PAD ARMBOARD 7.5X6 YLW CONV (MISCELLANEOUS) ×2 IMPLANT
PANTS MESH DISP LRG (UNDERPADS AND DIAPERS) ×1 IMPLANT
PANTS MESH DISPOSABLE L (UNDERPADS AND DIAPERS) ×1
PENCIL SMOKE EVACUATOR (MISCELLANEOUS) ×2 IMPLANT
SUT VIC AB 3-0 SH 27 (SUTURE) ×2
SUT VIC AB 3-0 SH 27XBRD (SUTURE) IMPLANT
SYR CONTROL 10ML LL (SYRINGE) ×2 IMPLANT
TOWEL OR 17X26 10 PK STRL BLUE (TOWEL DISPOSABLE) ×2 IMPLANT
TRAY DSU PREP LF (CUSTOM PROCEDURE TRAY) ×2 IMPLANT
TUBE CONNECTING 12X1/4 (SUCTIONS) ×2 IMPLANT
UNDERPAD 30X36 HEAVY ABSORB (UNDERPADS AND DIAPERS) ×2 IMPLANT
YANKAUER SUCT BULB TIP NO VENT (SUCTIONS) ×2 IMPLANT

## 2021-05-29 NOTE — H&P (Signed)
The patient is a 70 year old male who presents with a complaint of anal problems. 70 year old male who presents to the office today for evaluation of an anal lesion.  He states that he has had areas of soreness around his anal canal for several months to years.  He equates them to other lesions that he has throughout his body that swell up and drain.  This did improve with antibiotics.  It then recurred.  He has a lot of anal itching as well.  He has been treated for internal hemorrhoids with rubber band ligation in the past.  He denies any rectal bleeding currently.  He was seen in the office and he was felt to have hidradenitis.  He was placed on a long course of antibiotics for this.         Past Medical History:  Diagnosis Date   GERD (gastroesophageal reflux disease)     H/O: CVA (cerebrovascular accident)     Hypertension     OSA (obstructive sleep apnea)      No past surgical history on file.      Family History  Problem Relation Age of Onset   Cancer Mother     Stroke Father     Heart disease Father      Social History         Socioeconomic History   Marital status: Single      Spouse name: Not on file   Number of children: 2   Years of education: College   Highest education level: Not on file  Occupational History   Occupation: retired  Tobacco Use   Smoking status: Every Day      Packs/day: 0.25      Years: 30.00      Pack years: 7.50      Types: Cigarettes   Smokeless tobacco: Never   Tobacco comments:      he has cut back numbers cigs/smoked  Vaping Use   Vaping Use: Never used  Substance and Sexual Activity   Alcohol use: Yes      Alcohol/week: 0.0 standard drinks      Comment: Occasional    Drug use: Yes      Types: Marijuana      Comment: every once in a while   Sexual activity: Yes  Other Topics Concern   Not on file  Social History Narrative    Some caffeine use     Social Determinants of Health       Financial Resource Strain: Low Risk     Difficulty of Paying Living Expenses: Not hard at all  Food Insecurity: No Food Insecurity   Worried About Charity fundraiser in the Last Year: Never true   [place] in the Last Year: Never true  Transportation Needs: No Transportation Needs   Lack of Transportation (Medical): No   Lack of Transportation (Non-Medical): No  Physical Activity: Inactive   Days of Exercise per Week: 0 days   Minutes of Exercise per Session: 0 min  Stress: No Stress Concern Present   Feeling of Stress : Not at all  Social Connections: Not on file  Intimate Partner Violence: Not on file        Current Outpatient Medications  Medication Instructions   aspirin 81 mg, Oral, Daily   Cholecalciferol (VITAMIN D) 125 MCG (5000 UT) CAPS 1 capsule, Oral, Daily   Cholecalciferol 100 MCG (4000 UT) CAPS Oral, daily   EDARBYCLOR 40-25 MG  TABS TAKE (1) TABLET DAILY IN THE MORNING.   Omega-3 Fatty Acids (FISH OIL PO) Oral, 1 daily    Potassium Chloride ER 20 MEQ TBCR One 1.5 tab by mouth daily   spironolactone (ALDACTONE) 25 MG tablet TAKE 1 TABLET BY MOUTH IN  THE MORNING   verapamil (CALAN-SR) 240 MG CR tablet TAKE 1 TABLET BY MOUTH  TWICE DAILY   No Known Allergies Review of Systems - General ROS: negative for - chills, fatigue, or fever Respiratory ROS: no cough, shortness of breath, or wheezing Cardiovascular ROS: no chest pain or dyspnea on exertion Gastrointestinal ROS: no abdominal pain, change in bowel habits, or black or bloody stools Genito-Urinary ROS: no dysuria, trouble voiding, or hematuria        Objective:      BP 132/76   Pulse 80   Temp 99.1 F (37.3 C) (Oral)   Resp 17   Ht 6\' 3"  (1.905 m)   Wt 113.2 kg   SpO2 100%   BMI 31.20 kg/m       General appearance - alert, well appearing, and in no distress CV: RRR Lungs: CTA Abd: soft Rectal - L lateral lesion with purlence, no fluctunace, no palpable cord       Assessment and Plan:  Diagnoses and all orders for this  visit:   Anal lesion     Fistula vs hydradenitis.  I have recommended an exam under anesthesia with treatment depending on what I find.  If hidradenitis is found, we will do a wide excision.  If a fistula is noted and appears to be shallow then we will perform a fistulotomy.  If it is deeper we will place a seton.  I have discussed all obvious scenarios in detail with the patient.  We discussed risk of surgery which include bleeding, pain and recurrence.  All questions were answered.

## 2021-05-29 NOTE — Transfer of Care (Signed)
Immediate Anesthesia Transfer of Care Note  Patient: Terry Andrews  Procedure(s) Performed: Jasmine December UNDER ANESTHESIA WIDE EXCISION OF HYDRADINITIS  Patient Location: PACU  Anesthesia Type:MAC  Level of Consciousness: drowsy and responds to stimulation  Airway & Oxygen Therapy: Patient Spontanous Breathing and Patient connected to face mask oxygen  Post-op Assessment: Report given to RN and Post -op Vital signs reviewed and stable  Post vital signs: Reviewed and stable  Last Vitals:  Vitals Value Taken Time  BP 107/59 05/29/21 0756  Temp    Pulse 75 05/29/21 0757  Resp 16 05/29/21 0757  SpO2 96 % 05/29/21 0757  Vitals shown include unvalidated device data.  Last Pain:  Vitals:   05/29/21 0549  TempSrc: Oral  PainSc: 0-No pain      Patients Stated Pain Goal: 3 (51/88/41 [id number])  Complications: No notable events documented.

## 2021-05-29 NOTE — Op Note (Addendum)
05/29/2021  7:49 AM  PATIENT:  Dia Sitter  70 y.o. male  Patient Care Team: Glendale Chard, MD as PCP - General (Internal Medicine) Adrian Prows, MD as PCP - Cardiology (Cardiology)  PRE-OPERATIVE DIAGNOSIS:  ANAL LESION  POST-OPERATIVE DIAGNOSIS:  hydradinitis  PROCEDURE:   EXAM UNDER ANESTHESIA WIDE EXCISION OF HYDRADINITIS    Surgeon(s): Leighton Ruff, MD  ASSISTANT: none   ANESTHESIA:   local and MAC  SPECIMEN:  Source of Specimen:  gluteal lesion  DISPOSITION OF SPECIMEN:  PATHOLOGY  COUNTS:  YES  PLAN OF CARE: Discharge to home after PACU  PATIENT DISPOSITION:  PACU - hemodynamically stable.  INDICATION: 70 y.o. M with recurring perianal lesion   OR FINDINGS: No anal fistula.  Wound most consistent with hidradenitis.  DESCRIPTION: the patient was identified in the preoperative holding area and taken to the OR where they were laid on the operating room table.  MAC anesthesia was induced without difficulty. The patient was then positioned in prone jackknife position with buttocks gently taped apart.  The patient was then prepped and draped in usual sterile fashion.  SCDs were noted to be in place prior to the initiation of anesthesia. A surgical timeout was performed indicating the correct patient, procedure, positioning and need for preoperative antibiotics.  A rectal block was performed using Marcaine with epinephrine.    I began with a digital rectal exam.  I then placed a Hill-Ferguson anoscope into the anal canal and evaluated this completely.  There were no internal openings or inflammatory lesions noted within the anal canal.  There was minimal hemorrhoid disease.  I placed an S shaped fistula probe through the open wound in the left gluteus.  This transition underneath the dermis laterally into the skin of the gluteus on the left side.  I made an incision through the fistula tract.  I then made a wide excision of all the lesions associated with this tract  using Bovie electrocautery down to the level of the subcutaneous tissue.  This was ~2x6cm.  The edges of the wound were then marsupialized using a running 3-0 chromic suture.  Additional Marcaine was placed around the wound edges for added postoperative pain control.  Hemostasis was good.  A dressing was applied.  The patient was awakened from anesthesia and sent to the postanesthesia care unit in stable condition.  All counts were correct per operating room staff.

## 2021-05-29 NOTE — Anesthesia Postprocedure Evaluation (Signed)
Anesthesia Post Note  Patient: Terry Andrews  Procedure(s) Performed: EXAM UNDER ANESTHESIA (Rectum) WIDE EXCISION OF HYDRADINITIS (Rectum)     Patient location during evaluation: PACU Anesthesia Type: MAC Level of consciousness: awake and alert Pain management: pain level controlled Vital Signs Assessment: post-procedure vital signs reviewed and stable Respiratory status: spontaneous breathing and respiratory function stable Cardiovascular status: stable Postop Assessment: no apparent nausea or vomiting Anesthetic complications: no   No notable events documented.  Last Vitals:  Vitals:   05/29/21 0845 05/29/21 0900  BP: 114/67 123/71  Pulse: (!) 57 66  Resp: 13 16  Temp:    SpO2: 100% 100%    Last Pain:  Vitals:   05/29/21 0900  TempSrc:   PainSc: 0-No pain                 Charish Schroepfer DANIEL

## 2021-05-29 NOTE — Discharge Instructions (Addendum)

## 2021-05-30 ENCOUNTER — Encounter (HOSPITAL_BASED_OUTPATIENT_CLINIC_OR_DEPARTMENT_OTHER): Payer: Self-pay | Admitting: General Surgery

## 2021-05-30 LAB — SURGICAL PATHOLOGY

## 2021-06-09 DIAGNOSIS — H5203 Hypermetropia, bilateral: Secondary | ICD-10-CM | POA: Diagnosis not present

## 2021-06-09 DIAGNOSIS — H524 Presbyopia: Secondary | ICD-10-CM | POA: Diagnosis not present

## 2021-06-09 DIAGNOSIS — H52223 Regular astigmatism, bilateral: Secondary | ICD-10-CM | POA: Diagnosis not present

## 2021-06-20 DIAGNOSIS — G4733 Obstructive sleep apnea (adult) (pediatric): Secondary | ICD-10-CM | POA: Diagnosis not present

## 2021-06-23 DIAGNOSIS — Z23 Encounter for immunization: Secondary | ICD-10-CM | POA: Diagnosis not present

## 2021-06-26 ENCOUNTER — Other Ambulatory Visit: Payer: Self-pay | Admitting: Cardiology

## 2021-07-10 DIAGNOSIS — N281 Cyst of kidney, acquired: Secondary | ICD-10-CM | POA: Diagnosis not present

## 2021-07-20 ENCOUNTER — Other Ambulatory Visit: Payer: Self-pay | Admitting: Internal Medicine

## 2021-08-17 ENCOUNTER — Other Ambulatory Visit: Payer: Self-pay | Admitting: Cardiology

## 2021-09-08 DIAGNOSIS — G4733 Obstructive sleep apnea (adult) (pediatric): Secondary | ICD-10-CM | POA: Diagnosis not present

## 2021-09-15 ENCOUNTER — Encounter: Payer: Self-pay | Admitting: Internal Medicine

## 2021-09-15 ENCOUNTER — Ambulatory Visit (INDEPENDENT_AMBULATORY_CARE_PROVIDER_SITE_OTHER): Payer: Medicare Other | Admitting: Internal Medicine

## 2021-09-15 ENCOUNTER — Other Ambulatory Visit: Payer: Self-pay

## 2021-09-15 VITALS — BP 114/72 | HR 80 | Temp 98.2°F | Ht 75.0 in | Wt 250.0 lb

## 2021-09-15 DIAGNOSIS — Z6831 Body mass index (BMI) 31.0-31.9, adult: Secondary | ICD-10-CM

## 2021-09-15 DIAGNOSIS — R7309 Other abnormal glucose: Secondary | ICD-10-CM | POA: Diagnosis not present

## 2021-09-15 DIAGNOSIS — G4733 Obstructive sleep apnea (adult) (pediatric): Secondary | ICD-10-CM

## 2021-09-15 DIAGNOSIS — F17210 Nicotine dependence, cigarettes, uncomplicated: Secondary | ICD-10-CM

## 2021-09-15 DIAGNOSIS — E6609 Other obesity due to excess calories: Secondary | ICD-10-CM

## 2021-09-15 DIAGNOSIS — I119 Hypertensive heart disease without heart failure: Secondary | ICD-10-CM

## 2021-09-15 DIAGNOSIS — E876 Hypokalemia: Secondary | ICD-10-CM

## 2021-09-15 DIAGNOSIS — Z2821 Immunization not carried out because of patient refusal: Secondary | ICD-10-CM | POA: Diagnosis not present

## 2021-09-15 DIAGNOSIS — Z9989 Dependence on other enabling machines and devices: Secondary | ICD-10-CM

## 2021-09-15 NOTE — Progress Notes (Signed)
I,Terry Andrews,acting as a Education administrator for Terry Greenland, MD.,have documented all relevant documentation on the behalf of Terry Greenland, MD,as directed by  Terry Greenland, MD while in the presence of Terry Greenland, MD.  This visit occurred during the SARS-CoV-2 public health emergency.  Safety protocols were in place, including screening questions prior to the visit, additional usage of staff PPE, and extensive cleaning of exam room while observing appropriate contact time as indicated for disinfecting solutions.  Subjective:     [name] , male    DOB: 12/15/1950 , 71 y.o.   MRN: [id number]   Chief Complaint  Patient presents with   Hypertension    HPI  Patient is here for a hypertension follow up.  He reports compliance with meds. He denies headaches, chest pain and shortness of breath.  Hypertension This is a chronic problem. The current episode started more than 1 year ago. The problem has been gradually improving since onset. The problem is controlled. Pertinent negatives include no shortness of breath. Risk factors for coronary artery disease include obesity and male gender. Past treatments include diuretics, calcium channel blockers and angiotensin blockers. The current treatment provides moderate improvement.    Past Medical History:  Diagnosis Date   GERD (gastroesophageal reflux disease)    H/O: CVA (cerebrovascular accident) 2003   Patient states the only residual deficit is an occasional stutter.   Hypertension    OSA (obstructive sleep apnea)    wears CPAP q night     Family History  Problem Relation Age of Onset   Cancer Mother    Stroke Father    Heart disease Father      Current Outpatient Medications:    aspirin 81 MG tablet, Take 81 mg by mouth daily., Disp: , Rfl:    Azilsartan-Chlorthalidone (EDARBYCLOR) 40-25 MG TABS, TAKE ONE TABLET DAILY IN THE MORNING, Disp: 90 tablet, Rfl: 1   Cholecalciferol (VITAMIN D) 125 MCG (5000 UT) CAPS,  Take 1 capsule by mouth daily., Disp: , Rfl:    Omega-3 Fatty Acids (FISH OIL PO), Take by mouth. 1 daily, Disp: , Rfl:    potassium chloride SA (KLOR-CON M) 20 MEQ tablet, TAKE 1&1/2 tabS by MOUTH daily, Disp: 135 tablet, Rfl: 1   spironolactone (ALDACTONE) 25 MG tablet, TAKE 1 TABLET BY MOUTH IN  THE MORNING, Disp: 90 tablet, Rfl: 3   triamcinolone cream (KENALOG) 0.1 %, Apply 1 application topically as needed., Disp: , Rfl:    verapamil (CALAN-SR) 240 MG CR tablet, TAKE 1 TABLET BY MOUTH  TWICE DAILY, Disp: 180 tablet, Rfl: 3   traMADol (ULTRAM) 50 MG tablet, Take 1-2 tablets (50-100 mg total) by mouth every 6 (six) hours as needed. (Patient not taking: Reported on 09/15/2021), Disp: 30 tablet, Rfl: 0   No Known Allergies   Review of Systems  Constitutional: Negative.   Respiratory: Negative.  Negative for shortness of breath.   Cardiovascular: Negative.   Gastrointestinal: Negative.   Neurological: Negative.   Psychiatric/Behavioral: Negative.      Today's Vitals   09/15/21 0942  BP: 114/72  Pulse: 80  Temp: 98.2 F (36.8 C)  Weight: 250 lb (113.4 kg)  Height: '6\' 3"'  (1.905 m)  PainSc: 0-No pain   Body mass index is 31.25 kg/m.  Wt Readings from Last 3 Encounters:  09/15/21 250 lb (113.4 kg)  05/29/21 249 lb 9.6 oz (113.2 kg)  05/08/21 252 lb 8 oz (114.5 kg)    BP  Readings from Last 3 Encounters:  09/15/21 114/72  05/29/21 139/71  05/08/21 111/65    Objective:  Physical Exam Vitals and nursing note reviewed.  Constitutional:      Appearance: Normal appearance.  HENT:     Head: Normocephalic and atraumatic.     Nose:     Comments: Masked     Mouth/Throat:     Comments: Masked Eyes:     Extraocular Movements: Extraocular movements intact.  Cardiovascular:     Rate and Rhythm: Normal rate and regular rhythm.     Heart sounds: Normal heart sounds.  Pulmonary:     Effort: Pulmonary effort is normal.     Breath sounds: Normal breath sounds.  Musculoskeletal:      Cervical back: Normal range of motion.  Skin:    General: Skin is warm.  Neurological:     General: No focal deficit present.     Mental Status: He is alert.  Psychiatric:        Mood and Affect: Mood normal.        Assessment And Plan:     1. Hypertensive heart disease without heart failure Comments: Chronic, I will check renal function today. BP well controlled, no med changes. Cardiology note reviewed. He will rto in July 2023 as previously scheduled.  - Lipid panel - CMP14+EGFR  2. Other abnormal glucose Comments: HIs a1c has been elevated in the past. I will recheck this today. He is encouraged to limit his intake of sweetened beverages, including diet drinks.  - Hemoglobin A1c  3. Cigarette nicotine dependence without complication Comments: He agrees to Low dose CT scan, he has greater than 20-packyear h/o tobacco use. He does not wish to quit at this time. - CT CHEST LUNG CA SCREEN LOW DOSE W/O CM; Future  4. Obstructive sleep apnea treated with continuous positive airway pressure (CPAP) Comments: Chronic, he reports compliance with CPAP.   5. Hypokalemia Comments: I will recheck BMP today. He reports compliance with potassium supplementation.   6. Class 1 obesity due to excess calories with serious comorbidity and body mass index (BMI) of 31.0 to 31.9 in adult Comments:  He is encouraged to initially strive for BMI less than 30 to decrease cardiac risk. He is advised to exercise no less than 150 minutes per week.   7. Pneumococcal vaccination declined  8. Influenza vaccination declined  9. Herpes zoster vaccination declined  Patient was given opportunity to ask questions. Patient verbalized understanding of the plan and was able to repeat key elements of the plan. All questions were answered to their satisfaction.   I, Terry Greenland, MD, have reviewed all documentation for this visit. The documentation on 09/15/21 for the exam, diagnosis, procedures, and orders  are all accurate and complete.   IF YOU HAVE BEEN REFERRED TO A SPECIALIST, IT MAY TAKE 1-2 WEEKS TO SCHEDULE/PROCESS THE REFERRAL. IF YOU HAVE NOT HEARD FROM US/SPECIALIST IN TWO WEEKS, PLEASE GIVE [name] A CALL AT [phone].   THE PATIENT IS ENCOURAGED TO PRACTICE SOCIAL DISTANCING DUE TO THE COVID-19 PANDEMIC.

## 2021-09-15 NOTE — Patient Instructions (Signed)
Cooking With Less Salt Cooking with less salt is one way to reduce the amount of sodium you get from food. Sodium is one of the elements that make up salt. It is found naturally in foods and is also added to certain foods. Depending on your condition and overall health, your health care provider or dietitian may recommend that you reduce your sodium intake. Most people should have less than 2,300 milligrams (mg) of sodium each day. If you have high blood pressure (hypertension), you may need to limit your sodium to 1,500 mg each day. Follow the tipsbelow to help reduce your sodium intake. What are tips for eating less sodium? Reading food labels  Check the food label before buying or using packaged ingredients. Always check the label for the serving size and sodium content. Look for products with no more than 140 mg of sodium in one serving. Check the % Daily Value column to see what percent of the daily recommended amount of sodium is provided in one serving of the product. Foods with 5% or less in this column are considered low in sodium. Foods with 20% or higher are considered high in sodium. Do not choose foods with salt as one of the first three ingredients on the ingredients list. If salt is one of the first three ingredients, it usually means the item is high in sodium.  Shopping Buy sodium-free or low-sodium products. Look for the following words on food labels: Low-sodium. Sodium-free. Reduced-sodium. No salt added. Unsalted. Always check the sodium content even if foods are labeled as low-sodium or no salt added. Buy fresh foods. Cooking Use herbs, seasonings without salt, and spices as substitutes for salt. Use sodium-free baking soda when baking. Grill, braise, or roast foods to add flavor with less salt. Avoid adding salt to pasta, rice, or hot cereals. Drain and rinse canned vegetables, beans, and meat before use. Avoid adding salt when cooking sweets and desserts. Cook with  low-sodium ingredients. What foods are high in sodium? Vegetables Regular canned vegetables (not low-sodium or reduced-sodium). Sauerkraut, pickled vegetables, and relishes. Olives. French fries. Onion rings. Regular canned tomato sauce and paste. Regular tomato and vegetable juice. Frozenvegetables in sauces. Grains Instant hot cereals. Bread stuffing, pancake, and biscuit mixes. Croutons. Seasoned rice or pasta mixes. Noodle soup cups. Boxed or frozen macaroni and cheese. Regular salted crackers. Self-rising flour. Rolls. Bagels. Flourtortillas and wraps. Meats and other proteins Meat or fish that is salted, canned, smoked, cured, spiced, or pickled. This includes bacon, ham, sausages, hot dogs, corned beef, chipped beef, meat loaves, salt pork, jerky, pickled herring, anchovies, regular canned tuna, andsardines. Salted nuts. Dairy Processed cheese and cheese spreads. Cheese curds. Blue cheese. Feta cheese.String cheese. Regular cottage cheese. Buttermilk. Canned milk. The items listed above may not be a complete list of foods high in sodium. Actual amounts of sodium may be different depending on processing. Contact a dietitian for more information. What foods are low in sodium? Fruits Fresh, frozen, or canned fruit with no sauce added. Fruit juice. Vegetables Fresh or frozen vegetables with no sauce added. "No salt added" canned vegetables. "No salt added" tomato sauce and paste. Low-sodium orreduced-sodium tomato and vegetable juice. Grains Noodles, pasta, quinoa, rice. Shredded or puffed wheat or puffed rice. Regular or quick oats (not instant). Low-sodium crackers. Low-sodium bread. Whole-grainbread and whole-grain pasta. Unsalted popcorn. Meats and other proteins Fresh or frozen whole meats, poultry (not injected with sodium), and fish with no sauce added. Unsalted nuts. Dried peas, beans, and   lentils without added salt. Unsalted canned beans. Eggs. Unsalted nut butters. Low-sodium canned  tunaor chicken. Dairy Milk. Soy milk. Yogurt. Low-sodium cheeses, such as Swiss, Monterey Jack, mozzarella, and ricotta. Sherbet or ice cream (keep to  cup per serving).Cream cheese. Fats and oils Unsalted butter or margarine. Other foods Homemade pudding. Sodium-free baking soda and baking powder. Herbs and spices.Low-sodium seasoning mixes. Beverages Coffee and tea. Carbonated beverages. The items listed above may not be a complete list of foods low in sodium. Actual amounts of sodium may be different depending on processing. Contact a dietitian for more information. What are some salt alternatives when cooking? The following are herbs, seasonings, and spices that can be used instead of salt to flavor your food. Herbs should be fresh or dried. Do not choose packaged mixes. Next to the name of the herb, spice, or seasoning aresome examples of foods you can pair it with. Herbs [place] leaves - Soups, meat and vegetable dishes, and spaghetti sauce. Basil - Italian dishes, soups, pasta, and fish dishes. Cilantro - Meat, poultry, and vegetable dishes. Chili powder - Marinades and Mexican dishes. Chives - Salad dressings and potato dishes. Cumin - Mexican dishes, couscous, and meat dishes. Dill - Fish dishes, sauces, and salads. Fennel - Meat and vegetable dishes, breads, and cookies. Garlic (do not use garlic salt) - Italian dishes, meat dishes, salad dressings, and sauces. Marjoram - Soups, potato dishes, and meat dishes. Oregano - Pizza and spaghetti sauce. Parsley - Salads, soups, pasta, and meat dishes. Rosemary - Italian dishes, salad dressings, soups, and red meats. Saffron - Fish dishes, pasta, and some poultry dishes. Sage - Stuffings and sauces. Tarragon - Fish and poultry dishes. Thyme - Stuffing, meat, and fish dishes. Seasonings Lemon juice - Fish dishes, poultry dishes, vegetables, and salads. Vinegar - Salad dressings, vegetables, and fish dishes. Spices Cinnamon - Sweet  dishes, such as cakes, cookies, and puddings. Cloves - Gingerbread, puddings, and marinades for meats. Curry - Vegetable dishes, fish and poultry dishes, and stir-fry dishes. Ginger - Vegetable dishes, fish dishes, and stir-fry dishes. Nutmeg - Pasta, vegetables, poultry, fish dishes, and custard. Summary Cooking with less salt is one way to reduce the amount of sodium that you get from food. Buy sodium-free or low-sodium products. Check the food label before using or buying packaged ingredients. Use herbs, seasonings without salt, and spices as substitutes for salt in foods. This information is not intended to replace advice given to you by your health care provider. Make sure you discuss any questions you have with your healthcare provider. Document Revised: 07/12/2019 Document Reviewed: 07/12/2019 Elsevier Patient Education  2022 [employer] Inc[employer]  

## 2021-09-16 LAB — CMP14+EGFR
ALT: 22 IU/L (ref 0–44)
AST: 21 IU/L (ref 0–40)
Albumin/Globulin Ratio: 1.1 — ABNORMAL LOW (ref 1.2–2.2)
Albumin: 3.6 g/dL — ABNORMAL LOW (ref 3.8–4.8)
Alkaline Phosphatase: 59 IU/L (ref 44–121)
BUN/Creatinine Ratio: 14 (ref 10–24)
BUN: 14 mg/dL (ref 8–27)
Bilirubin Total: <0.2 mg/dL (ref 0.0–1.2)
CO2: 23 mmol/L (ref 20–29)
Calcium: 9.8 mg/dL (ref 8.6–10.2)
Chloride: 102 mmol/L (ref 96–106)
Creatinine, Ser: 1 mg/dL (ref 0.76–1.27)
Globulin, Total: 3.2 g/dL (ref 1.5–4.5)
Glucose: 138 mg/dL — ABNORMAL HIGH (ref 70–99)
Potassium: 3.4 mmol/L — ABNORMAL LOW (ref 3.5–5.2)
Sodium: 139 mmol/L (ref 134–144)
Total Protein: 6.8 g/dL (ref 6.0–8.5)
eGFR: 82 mL/min/{1.73_m2} (ref >59)

## 2021-09-16 LAB — LIPID PANEL
Chol/HDL Ratio: 2.3 ratio (ref 0.0–5.0)
Cholesterol, Total: 117 mg/dL (ref 100–199)
HDL: 50 mg/dL (ref >39)
LDL Chol Calc (NIH): 54 mg/dL (ref 0–99)
Triglycerides: 56 mg/dL (ref 0–149)
VLDL Cholesterol Cal: 13 mg/dL (ref 5–40)

## 2021-09-16 LAB — HEMOGLOBIN A1C
Est. average glucose Bld gHb Est-mCnc: 123 mg/dL
Hgb A1c MFr Bld: 5.9 % — ABNORMAL HIGH (ref 4.8–5.6)

## 2021-10-06 DIAGNOSIS — G4733 Obstructive sleep apnea (adult) (pediatric): Secondary | ICD-10-CM | POA: Diagnosis not present

## 2021-10-14 ENCOUNTER — Ambulatory Visit
Admission: RE | Admit: 2021-10-14 | Discharge: 2021-10-14 | Disposition: A | Discharge status: Home / Self Care | Payer: Medicare Other | Source: Ambulatory Visit | Attending: Internal Medicine | Admitting: Internal Medicine

## 2021-10-14 ENCOUNTER — Other Ambulatory Visit: Payer: Self-pay | Admitting: Nurse Practitioner

## 2021-10-14 DIAGNOSIS — F17210 Nicotine dependence, cigarettes, uncomplicated: Secondary | ICD-10-CM | POA: Diagnosis not present

## 2021-10-16 NOTE — Progress Notes (Signed)
Okay that is fine

## 2021-10-28 ENCOUNTER — Encounter: Payer: Self-pay | Admitting: Internal Medicine

## 2021-10-28 ENCOUNTER — Ambulatory Visit (INDEPENDENT_AMBULATORY_CARE_PROVIDER_SITE_OTHER): Payer: Medicare Other | Admitting: Internal Medicine

## 2021-10-28 ENCOUNTER — Other Ambulatory Visit: Payer: Self-pay

## 2021-10-28 VITALS — BP 122/80 | HR 68 | Temp 99.3°F | Ht 75.0 in | Wt 250.0 lb

## 2021-10-28 DIAGNOSIS — Z6831 Body mass index (BMI) 31.0-31.9, adult: Secondary | ICD-10-CM

## 2021-10-28 DIAGNOSIS — R911 Solitary pulmonary nodule: Secondary | ICD-10-CM

## 2021-10-28 DIAGNOSIS — I2510 Atherosclerotic heart disease of native coronary artery without angina pectoris: Secondary | ICD-10-CM | POA: Diagnosis not present

## 2021-10-28 DIAGNOSIS — E6609 Other obesity due to excess calories: Secondary | ICD-10-CM

## 2021-10-28 DIAGNOSIS — F17210 Nicotine dependence, cigarettes, uncomplicated: Secondary | ICD-10-CM

## 2021-10-28 DIAGNOSIS — I700 Atherosclerosis of aorta: Secondary | ICD-10-CM

## 2021-10-28 MED ORDER — POTASSIUM CHLORIDE CRYS ER 20 MEQ PO TBCR: EXTENDED_RELEASE_TABLET | ORAL | 2 refills | Status: DC

## 2021-10-28 NOTE — Progress Notes (Signed)
?Rich Brave [place],acting as a Education administrator for [name], MD.,have documented all relevant documentation on the behalf of [name], MD,as directed by  [name], MD while in the presence of [name], MD.  ?This visit occurred during the SARS-CoV-2 public health emergency.  Safety protocols were in place, including screening questions prior to the visit, additional usage of staff PPE, and extensive cleaning of exam room while observing appropriate contact time as indicated for disinfecting solutions. ? ?Subjective:  ?  ? [name] , male    DOB: Dec 10, 1950 , 71 y.o.   MRN: [id number] ? ? ?Chief Complaint  ?Patient presents with  ? discuss Results  ? ? ?HPI ? ?Patient would like to discuss his CT results with you. He had low dose chest CT performed due to tobacco use. He did receive a phone call regarding the results, but he preferred to go over results in person.  ?  ? ?Past Medical History:  ?Diagnosis Date  ? GERD (gastroesophageal reflux disease)   ? H/O: CVA (cerebrovascular accident) 2003  ? Patient states the only residual deficit is an occasional stutter.  ? Hypertension   ? OSA (obstructive sleep apnea)   ? wears CPAP q night  ?  ? ?Family History  ?Problem Relation Age of Onset  ? Cancer Mother   ? Stroke Father   ? Heart disease Father   ? ? ? ?Current Outpatient Medications:  ?  aspirin 81 MG tablet, Take 81 mg by mouth daily., Disp: , Rfl:  ?  atorvastatin (LIPITOR) 10 MG tablet, Take one tablet Mon, Wed, Friday daily with evening meal, Disp: 36 tablet, Rfl: 2 ?  Azilsartan-Chlorthalidone (EDARBYCLOR) 40-25 MG TABS, TAKE ONE TABLET DAILY IN THE MORNING, Disp: 90 tablet, Rfl: 1 ?  Cholecalciferol (VITAMIN D) 125 MCG (5000 UT) CAPS, Take 1 capsule by mouth daily., Disp: , Rfl:  ?  Omega-3 Fatty Acids (FISH OIL PO), Take by mouth. 1 daily, Disp: , Rfl:  ?  spironolactone (ALDACTONE) 25 MG tablet, TAKE 1 TABLET BY MOUTH IN  THE MORNING, Disp: 90 tablet, Rfl: 3 ?   triamcinolone cream (KENALOG) 0.1 %, Apply 1 application topically as needed., Disp: , Rfl:  ?  verapamil (CALAN-SR) 240 MG CR tablet, TAKE 1 TABLET BY MOUTH  TWICE DAILY, Disp: 180 tablet, Rfl: 3 ?  potassium chloride SA (KLOR-CON M) 20 MEQ tablet, TAKE 1&1/2 tabS by MOUTH daily, Disp: 135 tablet, Rfl: 2  ? ?No Known Allergies  ? ?Review of Systems  ?Constitutional: Negative.   ?Respiratory: Negative.    ?Cardiovascular: Negative.   ?Gastrointestinal: Negative.   ?Neurological: Negative.   ?Psychiatric/Behavioral: Negative.     ? ?Today's Vitals  ? 10/28/21 1047  ?BP: 122/80  ?Pulse: 68  ?Temp: 99.3 ?F (37.4 ?C)  ?Weight: 250 lb (113.4 kg)  ?Height: '6\' 3"'$  (1.905 m)  ?PainSc: 0-No pain  ? ?Body mass index is 31.25 kg/m?.  ?Wt Readings from Last 3 Encounters:  ?10/28/21 250 lb (113.4 kg)  ?09/15/21 250 lb (113.4 kg)  ?05/29/21 249 lb 9.6 oz (113.2 kg)  ?  ? ?Objective:  ?Physical Exam ?Vitals and nursing note reviewed.  ?Constitutional:   ?   Appearance: Normal appearance.  ?HENT:  ?   Head: Normocephalic and atraumatic.  ?   Nose:  ?   Comments: Masked  ?   Mouth/Throat:  ?   Comments: Masked  ?Eyes:  ?   Extraocular Movements: Extraocular  movements intact.  ?Cardiovascular:  ?   Rate and Rhythm: Normal rate and regular rhythm.  ?   Heart sounds: Normal heart sounds.  ?Pulmonary:  ?   Effort: Pulmonary effort is normal.  ?   Breath sounds: Normal breath sounds.  ?Musculoskeletal:  ?   Cervical back: Normal range of motion.  ?Skin: ?   General: Skin is warm.  ?Neurological:  ?   General: No focal deficit present.  ?   Mental Status: He is alert.  ?Psychiatric:     ?   Mood and Affect: Mood normal.  ?  ?IMAGING: ?EXAM: ?CT CHEST WITHOUT CONTRAST LOW-DOSE FOR LUNG CANCER SCREENING ?  ?TECHNIQUE: ?Multidetector CT imaging of the chest was performed following the ?standard protocol without IV contrast. ?  ?RADIATION DOSE REDUCTION: This exam was performed according to the ?departmental dose-optimization program which  includes automated ?exposure control, adjustment of the mA and/or kV according to ?patient size and/or use of iterative reconstruction technique. ?  ?COMPARISON:  None. ?  ?FINDINGS: ?Cardiovascular: Atherosclerotic calcification of the aorta and ?coronary arteries. Heart size normal. No pericardial effusion. ?  ?Mediastinum/Nodes: No pathologically enlarged mediastinal or ?axillary lymph nodes. Hilar regions are difficult to definitively ?evaluate without IV contrast. Esophagus is grossly unremarkable. ?  ?Lungs/Pleura: Centrilobular emphysema. Smoking related respiratory ?bronchiolitis. 10.1 mm ground-glass nodule in the left upper lobe. ?No additional pulmonary nodules. No pleural fluid. Airway is ?unremarkable. ?  ?Upper Abdomen: Visualized portions of the liver, gallbladder and ?right adrenal gland are unremarkable. Nodular thickening of the left ?adrenal gland. 2.3 cm low-attenuation lesion in the interpolar right ?kidney and 3.2 cm low-attenuation lesion in the lower pole left ?kidney are likely cysts. Other low-attenuation lesions in the ?kidneys are too small for definitive characterization. Spleen, ?pancreas, stomach and bowel are otherwise unremarkable. ?  ?Musculoskeletal: Degenerative changes in the spine. No worrisome ?lytic or sclerotic lesions. ?  ?IMPRESSION: ?1. Lung-RADS 2, benign appearance or behavior. Continue annual ?screening with low-dose chest CT without contrast in 12 months. ?2. Aortic atherosclerosis (ICD10-I70.0). Coronary artery ?calcification. ?3.  Emphysema (ICD10-J43.9). ?  ?  ?Electronically Signed ?  By: Lorin Picket M.D. ?  On: 10/15/2021 14:12 ?   ?Assessment And Plan:  ?   ?1. Atherosclerosis of aorta ([place]) ?Comments: He is on ASA therapy. He is willing to start statin therapy, atorvastatin '10mg'$  daily. He will rto in 6-8 weeks for re-evaluation. ? ?2. Atherosclerosis of native coronary artery of native heart without angina pectoris ?Comments: He agrees to start statin  therapy, atorvastatin '10mg'$  daily. He will rto in 6-8 weeks for re-evaluation.  ? ?3. Pulmonary nodule ?Comments: 10.48m ground glass nodule seen in left upper lobe. I plan to recheck chest CT in one year for repeat evaluation.  ? ?4. Class 1 obesity due to excess calories with serious comorbidity and body mass index (BMI) of 31.0 to 31.9 in adult ?Comments: He is encouraged to aim for at least 150 minutes of exercise/wk, while striving for BMI<30 to decrease cardiac risk.  ? ?5. Cigarette nicotine dependence without complication ? Smoking cessation instruction/counseling given:  counseled patient on the dangers of tobacco use, advised patient to stop smoking, and reviewed strategies to maximize success  ? ?Patient was given opportunity to ask questions. Patient verbalized understanding of the plan and was able to repeat key elements of the plan. All questions were answered to their satisfaction.  ? ?I, RMaximino Greenland MD, have reviewed all documentation for this visit. The documentation  on 10/28/21 for the exam, diagnosis, procedures, and orders are all accurate and complete.  ? ?IF YOU HAVE BEEN REFERRED TO A SPECIALIST, IT MAY TAKE 1-2 WEEKS TO SCHEDULE/PROCESS THE REFERRAL. IF YOU HAVE NOT HEARD FROM US/SPECIALIST IN TWO WEEKS, PLEASE GIVE [name] A CALL AT [phone] X [phone].  ? ?THE PATIENT IS ENCOURAGED TO PRACTICE SOCIAL DISTANCING DUE TO THE COVID-19 PANDEMIC.   ?

## 2021-10-30 MED ORDER — ATORVASTATIN CALCIUM 10 MG PO TABS: ORAL_TABLET | ORAL | 2 refills | Status: DC

## 2021-10-31 ENCOUNTER — Telehealth: Payer: Self-pay

## 2021-10-31 NOTE — Telephone Encounter (Signed)
Pt notified that his cholesterol med has been sent to the pharmacy YL,RMA ?

## 2021-12-23 ENCOUNTER — Ambulatory Visit (INDEPENDENT_AMBULATORY_CARE_PROVIDER_SITE_OTHER): Payer: Medicare Other | Admitting: Internal Medicine

## 2021-12-23 ENCOUNTER — Encounter: Payer: Self-pay | Admitting: Internal Medicine

## 2021-12-23 VITALS — BP 112/80 | HR 78 | Temp 98.5°F | Ht 75.0 in | Wt 247.4 lb

## 2021-12-23 DIAGNOSIS — I2510 Atherosclerotic heart disease of native coronary artery without angina pectoris: Secondary | ICD-10-CM

## 2021-12-23 DIAGNOSIS — Z2821 Immunization not carried out because of patient refusal: Secondary | ICD-10-CM

## 2021-12-23 DIAGNOSIS — E6609 Other obesity due to excess calories: Secondary | ICD-10-CM

## 2021-12-23 DIAGNOSIS — E7800 Pure hypercholesterolemia, unspecified: Secondary | ICD-10-CM | POA: Diagnosis not present

## 2021-12-23 DIAGNOSIS — Z6830 Body mass index (BMI) 30.0-30.9, adult: Secondary | ICD-10-CM

## 2021-12-23 NOTE — Progress Notes (Signed)
Rich Brave [place]leton,acting as a Education administrator for [name], MD.,have documented all relevant documentation on the behalf of [name], MD,as directed by  [name], MD while in the presence of [name], MD.  This visit occurred during the SARS-CoV-2 public health emergency.  Safety protocols were in place, including screening questions prior to the visit, additional usage of staff PPE, and extensive cleaning of exam room while observing appropriate contact time as indicated for disinfecting solutions.  Subjective:     [name] , male    DOB: [birth year] , 71 y.o.   MRN: [id number]   Chief Complaint  Patient presents with   Hyperlipidemia    HPI  Patient presents today for a 8 week f/u on his cholesterol. He was recently started on the atorvastatin, he has not had any issues with the medication.    Hyperlipidemia This is a chronic problem. The current episode started more than 1 year ago. Factors aggravating his hyperlipidemia include fatty foods. Current antihyperlipidemic treatment includes statins. The current treatment provides moderate improvement of lipids.    Past Medical History:  Diagnosis Date   GERD (gastroesophageal reflux disease)    H/O: CVA (cerebrovascular accident) 2003   Patient states the only residual deficit is an occasional stutter.   Hypertension    OSA (obstructive sleep apnea)    wears CPAP q night     Family History  Problem Relation Age of Onset   Cancer Mother    Stroke Father    Heart disease Father      Current Outpatient Medications:    aspirin 81 MG tablet, Take 81 mg by mouth daily., Disp: , Rfl:    atorvastatin (LIPITOR) 10 MG tablet, Take one tablet Mon, Wed, Friday daily with evening meal, Disp: 36 tablet, Rfl: 2   Azilsartan-Chlorthalidone (EDARBYCLOR) 40-25 MG TABS, TAKE ONE TABLET DAILY IN THE MORNING, Disp: 90 tablet, Rfl: 1   Cholecalciferol (VITAMIN D) 125 MCG (5000 UT) CAPS, Take 1 capsule by  mouth daily., Disp: , Rfl:    Omega-3 Fatty Acids (FISH OIL PO), Take by mouth. 1 daily, Disp: , Rfl:    potassium chloride SA (KLOR-CON M) 20 MEQ tablet, TAKE 1&1/2 tabS by MOUTH daily, Disp: 135 tablet, Rfl: 2   spironolactone (ALDACTONE) 25 MG tablet, TAKE 1 TABLET BY MOUTH IN  THE MORNING, Disp: 90 tablet, Rfl: 3   triamcinolone cream (KENALOG) 0.1 %, Apply 1 application topically as needed., Disp: , Rfl:    verapamil (CALAN-SR) 240 MG CR tablet, TAKE 1 TABLET BY MOUTH  TWICE DAILY, Disp: 180 tablet, Rfl: 3   No Known Allergies   Review of Systems  Constitutional: Negative.   Eyes: Negative.   Respiratory: Negative.    Cardiovascular: Negative.   Gastrointestinal: Negative.   Neurological: Negative.   Psychiatric/Behavioral: Negative.      Today's Vitals   12/23/21 1452  BP: 112/80  Pulse: 78  Temp: 98.5 F (36.9 C)  Weight: 247 lb 6.4 oz (112.2 kg)  Height: '6\' 3"'$  (1.905 m)  PainSc: 0-No pain   Body mass index is 30.92 kg/m.  Wt Readings from Last 3 Encounters:  12/23/21 247 lb 6.4 oz (112.2 kg)  10/28/21 250 lb (113.4 kg)  09/15/21 250 lb (113.4 kg)     Objective:  Physical Exam Vitals and nursing note reviewed.  Constitutional:      Appearance: Normal appearance.  Eyes:     Extraocular Movements: Extraocular movements intact.  Cardiovascular:     Rate and Rhythm: Normal rate and regular rhythm.     Heart sounds: Normal heart sounds.  Pulmonary:     Effort: Pulmonary effort is normal.     Breath sounds: Normal breath sounds.  Musculoskeletal:     Cervical back: Normal range of motion.  Skin:    General: Skin is warm.  Neurological:     General: No focal deficit present.     Mental Status: He is alert.  Psychiatric:        Mood and Affect: Mood normal.     Assessment And Plan:     1. Pure hypercholesterolemia Comments: Chronic, started on atorvastatin at last visit due to coronary artery disease. LDL goal <70. I will check repeat lipid panel today.   - Lipid panel - ALT  2. Atherosclerosis of native coronary artery of native heart without angina pectoris Comments: Chronic, LDL goal <70.  He is on ASA and statin therapy.  - Lipid panel - ALT  3. Herpes zoster vaccination declined  4. Class 1 obesity due to excess calories with serious comorbidity and body mass index (BMI) of 30.0 to 30.9 in adult  He is encouraged to initially strive for BMI less than 30 to decrease cardiac risk. He is advised to exercise no less than 150 minutes per week.    Patient was given opportunity to ask questions. Patient verbalized understanding of the plan and was able to repeat key elements of the plan. All questions were answered to their satisfaction.   I, [name], MD, have reviewed all documentation for this visit. The documentation on 12/23/21 for the exam, diagnosis, procedures, and orders are all accurate and complete.   IF YOU HAVE BEEN REFERRED TO A SPECIALIST, IT MAY TAKE 1-2 WEEKS TO SCHEDULE/PROCESS THE REFERRAL. IF YOU HAVE NOT HEARD FROM US/SPECIALIST IN TWO WEEKS, PLEASE GIVE [name] A CALL AT [phone].   THE PATIENT IS ENCOURAGED TO PRACTICE SOCIAL DISTANCING DUE TO THE COVID-19 PANDEMIC.

## 2021-12-23 NOTE — Patient Instructions (Signed)

## 2021-12-24 LAB — LIPID PANEL
Chol/HDL Ratio: 2 ratio (ref 0.0–5.0)
Cholesterol, Total: 105 mg/dL (ref 100–199)
HDL: 52 mg/dL (ref >39)
LDL Chol Calc (NIH): 42 mg/dL (ref 0–99)
Triglycerides: 43 mg/dL (ref 0–149)
VLDL Cholesterol Cal: 11 mg/dL (ref 5–40)

## 2021-12-24 LAB — ALT: ALT: 23 IU/L (ref 0–44)

## 2022-02-19 ENCOUNTER — Ambulatory Visit: Payer: Medicare Other | Admitting: Internal Medicine

## 2022-02-19 ENCOUNTER — Ambulatory Visit (INDEPENDENT_AMBULATORY_CARE_PROVIDER_SITE_OTHER): Payer: Medicare Other

## 2022-02-19 VITALS — Ht 75.0 in | Wt 250.0 lb

## 2022-02-19 DIAGNOSIS — Z0000 Encounter for general adult medical examination without abnormal findings: Secondary | ICD-10-CM

## 2022-02-19 NOTE — Progress Notes (Signed)
I connected with Terry Andrews today by telephone and verified that I am speaking with the correct person using two identifiers. Location patient: home Location provider: work Persons participating in the virtual visit: Kaylon, Hitz LPN.   I discussed the limitations, risks, security and privacy concerns of performing an evaluation and management service by telephone and the availability of in person appointments. I also discussed with the patient that there may be a patient responsible charge related to this service. The patient expressed understanding and verbally consented to this telephonic visit.    Interactive audio and video telecommunications were attempted between this provider and patient, however failed, due to patient having technical difficulties OR patient did not have access to video capability.  We continued and completed visit with audio only.     Vital signs may be patient reported or missing.  Subjective:   Terry Andrews is a 71 y.o. male who presents for Medicare Annual/Subsequent preventive examination.  Review of Systems     Cardiac Risk Factors include: advanced age (>19mn, >>42women);hypertension;male gender;obesity (BMI >30kg/m2);smoking/ tobacco exposure     Objective:    Today's Vitals   02/19/22 0940 02/19/22 0941  Weight: 250 lb (113.4 kg)   Height: '6\' 3"'$  (1.905 m)   PainSc:  2    Body mass index is 31.25 kg/m.     02/19/2022    9:43 AM 05/29/2021    5:45 AM 01/16/2021    8:32 AM 01/11/2020    9:09 AM 01/10/2019    9:05 AM  Advanced Directives  Does Patient Have a Medical Advance Directive? No No No Yes Yes  Type of AScientist, research (medical)Living will [place]?    Yes - validated most recent copy scanned in chart (See row information)   Would patient like information on creating a medical advance directive?  No - Patient declined        Current Medications (verified) Outpatient Encounter Medications as of 02/19/2022  Medication Sig   aspirin 81 MG tablet Take 81 mg by mouth daily.   atorvastatin (LIPITOR) 10 MG tablet Take one tablet Mon, Wed, Friday daily with evening meal   Azilsartan-Chlorthalidone (EDARBYCLOR) 40-25 MG TABS TAKE ONE TABLET DAILY IN THE MORNING   Cholecalciferol (VITAMIN D) 125 MCG (5000 UT) CAPS Take 1 capsule by mouth daily.   Omega-3 Fatty Acids (FISH OIL PO) Take by mouth. 1 daily   potassium chloride SA (KLOR-CON M) 20 MEQ tablet TAKE 1&1/2 tabS by MOUTH daily   spironolactone (ALDACTONE) 25 MG tablet TAKE 1 TABLET BY MOUTH IN  THE MORNING   triamcinolone cream (KENALOG) 0.1 % Apply 1 application topically as needed.   verapamil (CALAN-SR) 240 MG CR tablet TAKE 1 TABLET BY MOUTH  TWICE DAILY   No facility-administered encounter medications on file as of 02/19/2022.    Allergies (verified) Patient has no known allergies.   History: Past Medical History:  Diagnosis Date   GERD (gastroesophageal reflux disease)    H/O: CVA (cerebrovascular accident) 2003   Patient states the only residual deficit is an occasional stutter.   Hypertension    OSA (obstructive sleep apnea)    wears CPAP q night   Past Surgical History:  Procedure Laterality Date   ANAL FISTULOTOMY N/A 05/29/2021   Procedure: WIDE EXCISION OF HYDRADINITIS;  Surgeon: TLeighton Ruff MD;  [facility]  Service: General;  Laterality: N/A;   COLONOSCOPY  12/21/2019   Family History  Problem Relation Age of Onset   Cancer Mother    Stroke Father    Heart disease Father    Social History   Socioeconomic History   Marital status: Single    Spouse name: Not on file   Number of children: 2   Years of education: College   Highest education level: Not on file  Occupational History   Occupation: retired  Tobacco Use   Smoking status: Every Day    Packs/day: 0.25    Years: 52.00    Total pack  years: 13.00    Types: Cigarettes    Start date: 1965   Smokeless tobacco: Never   Tobacco comments:    he has cut back number cigs/smoked,   Vaping Use   Vaping Use: Never used  Substance and Sexual Activity   Alcohol use: Yes    Alcohol/week: 0.0 standard drinks of alcohol    Comment: Occasional    Drug use: Yes    Types: Marijuana    Comment: every once in a while, (as of 05/22/21 patient last smoked marijauna on 05/21/2021)   Sexual activity: Yes  Other Topics Concern   Not on file  Social History Narrative   Some caffeine use    Social Determinants of Health   Financial Resource Strain: Low Risk  (02/19/2022)   Overall Financial Resource Strain (CARDIA)    Difficulty of Paying Living Expenses: Not hard at all  Food Insecurity: No Food Insecurity (02/19/2022)   Hunger Vital Sign    Worried About Running Out of Food in the Last Year: Never true    Ran Out of Food in the Last Year: Never true  Transportation Needs: No Transportation Needs (02/19/2022)   PRAPARE - Hydrologist (Medical): No    Lack of Transportation (Non-Medical): No  Physical Activity: Inactive (02/19/2022)   Exercise Vital Sign    Days of Exercise per Week: 0 days    Minutes of Exercise per Session: 0 min  Stress: No Stress Concern Present (02/19/2022)   [place]    Feeling of Stress : Not at all  Social Connections: Not on file    Tobacco Counseling Ready to quit: Not Answered Counseling given: Not Answered Tobacco comments: he has cut back number cigs/smoked,    Clinical Intake:  Pre-visit preparation completed: Yes  Pain : 0-10 Pain Score: 2  Pain Type: Chronic pain Pain Location: Generalized Pain Descriptors / Indicators: Aching Pain Onset: More than a month ago Pain Frequency: Constant     Nutritional Status: BMI > 30  Obese Nutritional Risks: None Diabetes: No  How often do you need  to have someone help you when you read instructions, pamphlets, or other written materials from your doctor or pharmacy?: 1 - Never  Diabetic? no  Interpreter Needed?: No  Information entered by :: NAllen LPN   Activities of Daily Living    02/19/2022    9:44 AM 05/29/2021    5:51 AM  In your present state of health, do you have any difficulty performing the following activities:  Hearing? 0 0  Vision? 0 0  Difficulty concentrating or making decisions? 1 0  Walking or climbing stairs? 1 0  Dressing or bathing? 0 0  Doing errands, shopping? 0   Preparing Food and eating ? N   Using the Toilet? N   In the  past six months, have you accidently leaked urine? N   Do you have problems with loss of bowel control? N   Managing your Medications? N   Managing your Finances? N   Housekeeping or managing your Housekeeping? N     Patient Care Team: Glendale Chard, MD as PCP - General (Internal Medicine) Adrian Prows, MD as PCP - Cardiology (Cardiology)  Indicate any recent Medical Services you may have received from other than [facility] providers in the past year (date may be approximate).     Assessment:   This is a routine wellness examination for [name].  Hearing/Vision screen Vision Screening - Comments:: Regular eye exams, Dr. Peter Garter  Dietary issues and exercise activities discussed: Current Exercise Habits: The patient does not participate in regular exercise at present   Goals Addressed             This Visit's Progress    Patient Stated       02/19/2022, stay alive       Depression Screen    02/19/2022    9:44 AM 01/16/2021    8:33 AM 01/11/2020    9:09 AM 03/08/2019    9:14 AM 01/10/2019    9:05 AM 12/13/2018    8:35 AM 08/11/2018    8:55 AM  PHQ 2/9 Scores  PHQ - 2 Score 0 0 0 0 0 0 0  PHQ- 9 Score   0 0 0      Fall Risk    02/19/2022    9:44 AM 01/16/2021    8:33 AM 01/11/2020    9:09 AM 03/08/2019    9:14 AM 01/10/2019    9:05 AM  Fall Risk   Falls in the past year? 0  0 0 0 0  Number falls in past yr: 0      Injury with Fall? 0      Risk for fall due to : Medication side effect Medication side effect   Medication side effect  Follow up Falls evaluation completed;Education provided;Falls prevention discussed Falls evaluation completed;Education provided;Falls prevention discussed   Falls prevention discussed    FALL RISK PREVENTION PERTAINING TO THE HOME:  Any stairs in or around the home? Yes  If so, are there any without handrails? No  Home free of loose throw rugs in walkways, pet beds, electrical cords, etc? Yes  Adequate lighting in your home to reduce risk of falls? Yes   ASSISTIVE DEVICES UTILIZED TO PREVENT FALLS:  Life alert? No  Use of a cane, walker or w/c? No  Grab bars in the bathroom? No  Shower chair or bench in shower? No  Elevated toilet seat or a handicapped toilet? Yes   TIMED UP AND GO:  Was the test performed? No .       Cognitive Function:        02/19/2022    9:45 AM 01/16/2021    8:35 AM 01/11/2020    9:10 AM 01/10/2019    9:07 AM  6CIT Screen  What Year? 0 points 0 points 0 points 0 points  What month? 0 points 0 points 0 points 0 points  What time? 0 points 0 points 0 points 3 points  Count back from 20 0 points 0 points 0 points 0 points  Months in reverse 0 points 0 points 0 points 0 points  Repeat phrase 6 points 6 points 4 points 0 points  Total Score 6 points 6 points 4 points 3 points    Immunizations Immunization  History  Administered Date(s) Administered   PFIZER(Purple Top)SARS-COV-2 Vaccination 08/22/2019, 09/12/2019, 05/07/2020, 12/20/2020   Pfizer Covid-19 Vaccine Bivalent Booster 5y-11y 06/23/2021   Tdap 02/18/2017    TDAP status: Up to date  Flu Vaccine status: Declined, Education has been provided regarding the importance of this vaccine but patient still declined. Advised may receive this vaccine at local pharmacy or Health Dept. Aware to provide a copy of the vaccination record if  obtained from local pharmacy or Health Dept. Verbalized acceptance and understanding.  Pneumococcal vaccine status: Declined,  Education has been provided regarding the importance of this vaccine but patient still declined. Advised may receive this vaccine at local pharmacy or Health Dept. Aware to provide a copy of the vaccination record if obtained from local pharmacy or Health Dept. Verbalized acceptance and understanding.   Covid-19 vaccine status: Completed vaccines  Qualifies for Shingles Vaccine? Yes   Zostavax completed No   Shingrix Completed?: No.    Education has been provided regarding the importance of this vaccine. Patient has been advised to call insurance company to determine out of pocket expense if they have not yet received this vaccine. Advised may also receive vaccine at local pharmacy or Health Dept. Verbalized acceptance and understanding.  Screening Tests Health Maintenance  Topic Date Due   COVID-19 Vaccine (6 - Pfizer series) 10/21/2021   Zoster Vaccines- Shingrix (1 of 2) 03/25/2022 (Originally 06/02/2003)   Pneumonia Vaccine 21+ Years old (1 - PCV) 09/15/2022 (Originally 06/02/1957)   INFLUENZA VACCINE  03/03/2022   TETANUS/TDAP  02/19/2027   COLONOSCOPY (Pts 45-72yr Insurance coverage will need to be confirmed)  12/20/2029   Hepatitis C Screening  Completed   HPV VACCINES  Aged Out    Health Maintenance  Health Maintenance Due  Topic Date Due   COVID-19 Vaccine (6 - PDallasseries) 10/21/2021    Colorectal cancer screening: Type of screening: Colonoscopy. Completed 12/21/2019. Repeat every 3-5 years  Lung Cancer Screening: (Low Dose CT Chest recommended if Age 69-80years, 30 pack-year currently smoking OR have quit w/in 15years.) does not qualify.   Lung Cancer Screening Referral: no  Additional Screening:  Hepatitis C Screening: does qualify; Completed 08/11/2018  Vision Screening: Recommended annual ophthalmology exams for early detection of  glaucoma and other disorders of the eye. Is the patient up to date with their annual eye exam?  Yes  Who is the provider or what is the name of the office in which the patient attends annual eye exams? Dr. KPeter GarterIf pt is not established with a provider, would they like to be referred to a provider to establish care? No .   Dental Screening: Recommended annual dental exams for proper oral hygiene  Community Resource Referral / Chronic Care Management: CRR required this visit?  No   CCM required this visit?  No      Plan:     I have personally reviewed and noted the following in the patient's chart:   Medical and social history Use of alcohol, tobacco or illicit drugs  Current medications and supplements including opioid prescriptions. Patient is not currently taking opioid prescriptions. Functional ability and status Nutritional status Physical activity Advanced directives List of other physicians Hospitalizations, surgeries, and ER visits in previous 12 months Vitals Screenings to include cognitive, depression, and falls Referrals and appointments  In addition, I have reviewed and discussed with patient certain preventive protocols, quality metrics, and best practice recommendations. A written personalized care plan for preventive services as well as general  preventive health recommendations were provided to patient.     Kellie Simmering, LPN   [id number]   Nurse Notes: none  Due to this being a virtual visit, the after visit summary with patients personalized plan was offered to patient via mail or my-chart.  pick up at office at next visit

## 2022-02-19 NOTE — Patient Instructions (Signed)
Terry Andrews , Thank you for taking time to come for your Medicare Wellness Visit. I appreciate your ongoing commitment to your health goals. Please review the following plan we discussed and let me know if I can assist you in the future.   Screening recommendations/referrals: Colonoscopy: completed 12/21/2019 Recommended yearly ophthalmology/optometry visit for glaucoma screening and checkup Recommended yearly dental visit for hygiene and checkup  Vaccinations: Influenza vaccine: decline Pneumococcal vaccine: decline Tdap vaccine: completed 02/18/2017, due 02/19/2027 Shingles vaccine: discussed   Covid-19:  06/23/2021, 12/20/2020, 05/07/2020, 09/12/2019, 08/22/2019  Advanced directives: Advance directive discussed with you today.   Conditions/risks identified: smoking  Next appointment: Follow up in one year for your annual wellness visit.   Preventive Care 42 Years and Older, Male Preventive care refers to lifestyle choices and visits with your health care provider that can promote health and wellness. What does preventive care include? A yearly physical exam. This is also called an annual well check. Dental exams once or twice a year. Routine eye exams. Ask your health care provider how often you should have your eyes checked. Personal lifestyle choices, including: Daily care of your teeth and gums. Regular physical activity. Eating a healthy diet. Avoiding tobacco and drug use. Limiting alcohol use. Practicing safe sex. Taking low doses of aspirin every day. Taking vitamin and mineral supplements as recommended by your health care provider. What happens during an annual well check? The services and screenings done by your health care provider during your annual well check will depend on your age, overall health, lifestyle risk factors, and family history of disease. Counseling  Your health care provider may ask you questions about your: Alcohol use. Tobacco use. Drug  use. Emotional well-being. Home and relationship well-being. Sexual activity. Eating habits. History of falls. Memory and ability to understand (cognition). Work and work Statistician. Screening  You may have the following tests or measurements: Height, weight, and BMI. Blood pressure. Lipid and cholesterol levels. These may be checked every 5 years, or more frequently if you are over 20 years old. Skin check. Lung cancer screening. You may have this screening every year starting at age 82 if you have a 30-pack-year history of smoking and currently smoke or have quit within the past 15 years. Fecal occult blood test (FOBT) of the stool. You may have this test every year starting at age 37. Flexible sigmoidoscopy or colonoscopy. You may have a sigmoidoscopy every 5 years or a colonoscopy every 10 years starting at age 50. Prostate cancer screening. Recommendations will vary depending on your family history and other risks. Hepatitis C blood test. Hepatitis B blood test. Sexually transmitted disease (STD) testing. Diabetes screening. This is done by checking your blood sugar (glucose) after you have not eaten for a while (fasting). You may have this done every 1-3 years. Abdominal aortic aneurysm (AAA) screening. You may need this if you are a current or former smoker. Osteoporosis. You may be screened starting at age 76 if you are at high risk. Talk with your health care provider about your test results, treatment options, and if necessary, the need for more tests. Vaccines  Your health care provider may recommend certain vaccines, such as: Influenza vaccine. This is recommended every year. Tetanus, diphtheria, and acellular pertussis (Tdap, Td) vaccine. You may need a Td booster every 10 years. Zoster vaccine. You may need this after age 1. Pneumococcal 13-valent conjugate (PCV13) vaccine. One dose is recommended after age 22. Pneumococcal polysaccharide (PPSV23) vaccine. One dose is  recommended after age 63. Talk to your health care provider about which screenings and vaccines you need and how often you need them. This information is not intended to replace advice given to you by your health care provider. Make sure you discuss any questions you have with your health care provider. Document Released: 08/16/2015 Document Revised: 04/08/2016 Document Reviewed: 05/21/2015 Elsevier Interactive Patient Education  2017 [place] Prevention in the Home Falls can cause injuries. They can happen to people of all ages. There are many things you can do to make your home safe and to help prevent falls. What can I do on the outside of my home? Regularly fix the edges of walkways and driveways and fix any cracks. Remove anything that might make you trip as you walk through a door, such as a raised step or threshold. Trim any bushes or trees on the path to your home. Use bright outdoor lighting. Clear any walking paths of anything that might make someone trip, such as rocks or tools. Regularly check to see if handrails are loose or broken. Make sure that both sides of any steps have handrails. Any raised decks and porches should have guardrails on the edges. Have any leaves, snow, or ice cleared regularly. Use sand or salt on walking paths during winter. Clean up any spills in your garage right away. This includes oil or grease spills. What can I do in the bathroom? Use night lights. Install grab bars by the toilet and in the tub and shower. Do not use towel bars as grab bars. Use non-skid mats or decals in the tub or shower. If you need to sit down in the shower, use a plastic, non-slip stool. Keep the floor dry. Clean up any water that spills on the floor as soon as it happens. Remove soap buildup in the tub or shower regularly. Attach bath mats securely with double-sided non-slip rug tape. Do not have throw rugs and other things on the floor that can make you  trip. What can I do in the bedroom? Use night lights. Make sure that you have a light by your bed that is easy to reach. Do not use any sheets or blankets that are too big for your bed. They should not hang down onto the floor. Have a firm chair that has side arms. You can use this for support while you get dressed. Do not have throw rugs and other things on the floor that can make you trip. What can I do in the kitchen? Clean up any spills right away. Avoid walking on wet floors. Keep items that you use a lot in easy-to-reach places. If you need to reach something above you, use a strong step stool that has a grab bar. Keep electrical cords out of the way. Do not use floor polish or wax that makes floors slippery. If you must use wax, use non-skid floor wax. Do not have throw rugs and other things on the floor that can make you trip. What can I do with my stairs? Do not leave any items on the stairs. Make sure that there are handrails on both sides of the stairs and use them. Fix handrails that are broken or loose. Make sure that handrails are as long as the stairways. Check any carpeting to make sure that it is firmly attached to the stairs. Fix any carpet that is loose or worn. Avoid having throw rugs at the top or bottom of the stairs. If you  do have throw rugs, attach them to the floor with carpet tape. Make sure that you have a light switch at the top of the stairs and the bottom of the stairs. If you do not have them, ask someone to add them for you. What else can I do to help prevent falls? Wear shoes that: Do not have high heels. Have rubber bottoms. Are comfortable and fit you well. Are closed at the toe. Do not wear sandals. If you use a stepladder: Make sure that it is fully opened. Do not climb a closed stepladder. Make sure that both sides of the stepladder are locked into place. Ask someone to hold it for you, if possible. Clearly mark and make sure that you can  see: Any grab bars or handrails. First and last steps. Where the edge of each step is. Use tools that help you move around (mobility aids) if they are needed. These include: Canes. Walkers. Scooters. Crutches. Turn on the lights when you go into a dark area. Replace any light bulbs as soon as they burn out. Set up your furniture so you have a clear path. Avoid moving your furniture around. If any of your floors are uneven, fix them. If there are any pets around you, be aware of where they are. Review your medicines with your doctor. Some medicines can make you feel dizzy. This can increase your chance of falling. Ask your doctor what other things that you can do to help prevent falls. This information is not intended to replace advice given to you by your health care provider. Make sure you discuss any questions you have with your health care provider. Document Released: 05/16/2009 Document Revised: 12/26/2015 Document Reviewed: 08/24/2014 Elsevier Interactive Patient Education  2017 [employer].

## 2022-03-15 ENCOUNTER — Other Ambulatory Visit: Payer: Self-pay | Admitting: Cardiology

## 2022-03-26 ENCOUNTER — Other Ambulatory Visit: Payer: Self-pay | Admitting: Cardiology

## 2022-03-27 ENCOUNTER — Ambulatory Visit: Payer: Medicare Other | Admitting: Cardiology

## 2022-03-27 ENCOUNTER — Encounter: Payer: Self-pay | Admitting: Cardiology

## 2022-03-27 VITALS — BP 123/70 | HR 76 | Temp 97.6°F | Resp 16 | Ht 75.0 in | Wt 251.4 lb

## 2022-03-27 DIAGNOSIS — I119 Hypertensive heart disease without heart failure: Secondary | ICD-10-CM

## 2022-03-27 DIAGNOSIS — E7800 Pure hypercholesterolemia, unspecified: Secondary | ICD-10-CM

## 2022-03-27 DIAGNOSIS — I10 Essential (primary) hypertension: Secondary | ICD-10-CM

## 2022-03-27 NOTE — Progress Notes (Signed)
Primary Physician/Referring:  Glendale Chard, MD  [name], male    DOB: [birth year], 71 y.o.   MRN: [id number]   Chief Complaint  Patient presents with   Hypertension   Follow-up    1 year    HPI:    Terry Andrews  is a 71 y.o. African-American male with hypertension, history of remote stroke in 2003 with no residual defects probably related to hypertension, tobacco use disorder, obstructive sleep apnea on CPAP presents for annual visit. Presently he is doing well and denies any chest pain, shortness of breath, PND or orthopnea.     Past Medical History:  Diagnosis Date   GERD (gastroesophageal reflux disease)    H/O: CVA (cerebrovascular accident) 2003   Patient states the only residual deficit is an occasional stutter.   Hyperlipidemia    Hypertension    OSA (obstructive sleep apnea)    wears CPAP q night   Past Surgical History:  Procedure Laterality Date   ANAL FISTULOTOMY N/A 05/29/2021   Procedure: WIDE EXCISION OF HYDRADINITIS;  Surgeon: Leighton Ruff, MD;  Location: [facility];  Service: General;  Laterality: N/A;   COLONOSCOPY  12/21/2019   Family History  Problem Relation Age of Onset   Cancer Mother    Stroke Father    Heart disease Father     Social History   Tobacco Use   Smoking status: Every Day    Packs/day: 0.25    Years: 52.00    Total pack years: 13.00    Types: Cigarettes    Start date: 1965   Smokeless tobacco: Never   Tobacco comments:    he has cut back number cigs/smoked,   Substance Use Topics   Alcohol use: Yes    Alcohol/week: 0.0 standard drinks of alcohol    Comment: Occasional    Marital Status: Single  ROS  Review of Systems  Cardiovascular:  Negative for chest pain, dyspnea on exertion and leg swelling.  Gastrointestinal:  Negative for melena.   Objective  Blood pressure 123/70, pulse 76, temperature 97.6 F (36.4 C), temperature source Temporal, resp. rate 16, height '6\' 3"'  (1.905 m),  weight 251 lb 6.4 oz (114 kg), SpO2 97 %.      03/27/2022    9:00 AM 02/19/2022    9:40 AM 12/23/2021    2:52 PM  Vitals with BMI  Height '6\' 3"'  '6\' 3"'  '6\' 3"'   Weight 251 lbs 6 oz 250 lbs 247 lbs 6 oz  BMI 31.42 76.16 07.37  Systolic 106  269  Diastolic 70  80  Pulse 76  78     Physical Exam HENT:     Head: Atraumatic.  Neck:     Vascular: No carotid bruit or JVD.  Cardiovascular:     Rate and Rhythm: Normal rate and regular rhythm.     Pulses: Intact distal pulses.     Heart sounds: S1 normal and S2 normal. Murmur heard.     Systolic murmur is present with a grade of 2/6 at the upper right sternal border.     No gallop.  Pulmonary:     Effort: Pulmonary effort is normal.     Breath sounds: Normal breath sounds.  Abdominal:     General: Bowel sounds are normal.     Palpations: Abdomen is soft.  Musculoskeletal:     Right lower leg: No edema.     Left lower leg: No edema.    Laboratory examination:  Lab Results  Component Value Date   NA 139 09/15/2021   K 3.4 (L) 09/15/2021   CO2 23 09/15/2021   GLUCOSE 138 (H) 09/15/2021   BUN 14 09/15/2021   CREATININE 1.00 09/15/2021   CALCIUM 9.8 09/15/2021   EGFR 82 09/15/2021   GFRNONAA 84 09/23/2020   CrCl cannot be calculated (Patient's most recent lab result is older than the maximum 21 days allowed.).     Latest Ref Rng & Units 12/23/2021    3:47 PM 09/15/2021   10:08 AM 05/29/2021    6:04 AM  CMP  Glucose 70 - 99 mg/dL  138  115   BUN 8 - 27 mg/dL  14  14   Creatinine 0.76 - 1.27 mg/dL  1.00  1.10   Sodium 134 - 144 mmol/L  139  143   Potassium 3.5 - 5.2 mmol/L  3.4  2.9   Chloride 96 - 106 mmol/L  102  101   CO2 20 - 29 mmol/L  23    Calcium 8.6 - 10.2 mg/dL  9.8    Total Protein 6.0 - 8.5 g/dL  6.8    Total Bilirubin 0.0 - 1.2 mg/dL  <0.2    Alkaline Phos 44 - 121 IU/L  59    AST 0 - 40 IU/L  21    ALT 0 - 44 IU/L 23  22        Latest Ref Rng & Units 05/29/2021    6:04 AM 03/24/2021    9:54 AM 03/26/2020     4:49 PM  CBC  WBC 3.4 - 10.8 x10E3/uL  6.6  9.3   Hemoglobin 13.0 - 17.0 g/dL 14.6  14.9  14.2   Hematocrit 39.0 - 52.0 % 43.0  43.9  42.5   Platelets 150 - 450 x10E3/uL  274  256     Lipid Panel Recent Labs    09/15/21 1008 12/23/21 1547  CHOL 117 105  TRIG 56 43  LDLCALC 54 42  HDL 50 52  CHOLHDL 2.3 2.0    HEMOGLOBIN A1C Lab Results  Component Value Date   HGBA1C 5.9 (H) 09/15/2021   TSH No results for input(s): "TSH" in the last 8760 hours.  Medications and allergies  No Known Allergies   Current Outpatient Medications:    aspirin 81 MG tablet, Take 81 mg by mouth daily., Disp: , Rfl:    atorvastatin (LIPITOR) 10 MG tablet, Take one tablet Mon, Wed, Friday daily with evening meal, Disp: 36 tablet, Rfl: 2   Cholecalciferol (VITAMIN D) 125 MCG (5000 UT) CAPS, Take 1 capsule by mouth daily., Disp: , Rfl:    EDARBYCLOR 40-25 MG TABS, TAKE ONE TABLET DAILY IN THE MORNING, Disp: 90 tablet, Rfl: 1   Omega-3 Fatty Acids (FISH OIL PO), Take by mouth. 1 daily, Disp: , Rfl:    potassium chloride SA (KLOR-CON M) 20 MEQ tablet, TAKE 1&1/2 tabS by MOUTH daily, Disp: 135 tablet, Rfl: 2   spironolactone (ALDACTONE) 25 MG tablet, TAKE 1 TABLET BY MOUTH IN THE  MORNING, Disp: 100 tablet, Rfl: 2   triamcinolone cream (KENALOG) 0.1 %, Apply 1 application topically as needed., Disp: , Rfl:    verapamil (CALAN-SR) 240 MG CR tablet, TAKE 1 TABLET BY MOUTH TWICE  DAILY, Disp: 200 tablet, Rfl: 2  Radiology:   No results found.  Cardiac Studies:   Exercise sestamibi 10/16/11: Ex 5 min, 7.3 METs. Small ant infarct vs attenuation. EF 45% Dil LV. Low risk stress  test. Patient asymptomatic, contineu medical therapy.    Carotid duplex 04/13/11: Mild calcific plaque left carotid artery. No stenosis    Renal duplex 04/13/11: Normal renal duplex scan. Comp to prev, right renal A. do no suggest <60% stenosis.  Echocardiogram   04/15/2017: Left ventricle cavity is normal in size. Mild  concentric hypertrophy of the left ventricle. Low normal LV systolic function with global hypokinesis. Visual EF is 50-55%. Doppler evidence of grade I (impaired) diastolic dysfunction, elevated LAP. Mild tricuspid regurgitation. No evidence of pulmonary hypertension. Impaired to the study done on 04/16/2011, EF previously was normal.  Abdominal Aortic Duplex  06/07/2019:  Normal abdominal aorta. No evidence of aneurysm.  Incidental 1.3x1.6 heterogeneous mass right kidney apex noted, consider  dedicated US of the kidneys.  See attached image. (will be ordered)  US abdomen 09/21/2019: Simple appearing cyst off the lower pole of the left kidney. No acute findings.  No hydronephrosis. Prostate enlargement.  EKG  EKG 03/27/2022: Sinus rhythm with first AV block 75 bpm, normal axis, LVH.  1 pair of Ventricular couplets, multifocal.  Nonspecific T abnormality.  No significant change from 03/26/2021, PVCs  Assessment     ICD-10-CM   1. Hypertensive heart disease without heart failure  I11.9 PCV ECHOCARDIOGRAM COMPLETE    2. Essential hypertension  I10 EKG 12-Lead    3. Pure hypercholesterolemia  E78.00        There are no discontinued medications.   No orders of the defined types were placed in this encounter.  Recommendations:   Terry Andrews is a 71 y.o. African-American male with hypertension, history of remote stroke in 2003 with no residual defects probably related to hypertension, tobacco use disorder, obstructive sleep apnea on CPAP presents for annual visit.   He is essentially asymptomatic.  Physical examination is unchanged from previous, I reviewed his external labs, renal function is normal, lipids are well controlled, he does have mild hyperglycemia that is also stable.  Blood pressure is under excellent control.  No changes in the medications were done today.  I would like to repeat echocardiogram to reevaluate his LV systolic function.  I will see him back again in a year or  sooner if problems.  Smoking cessation again discussed with the patient.  He has been compliant with CPAP use.   Adrian Prows, MD, [facility] 03/27/2022, 9:34 AM Office: [phone]

## 2022-03-30 ENCOUNTER — Encounter: Payer: Self-pay | Admitting: Internal Medicine

## 2022-03-30 ENCOUNTER — Ambulatory Visit (INDEPENDENT_AMBULATORY_CARE_PROVIDER_SITE_OTHER): Payer: Medicare Other | Admitting: Internal Medicine

## 2022-03-30 ENCOUNTER — Encounter: Payer: Medicare Other | Admitting: Internal Medicine

## 2022-03-30 VITALS — BP 118/84 | HR 79 | Temp 98.1°F | Ht 75.0 in | Wt 246.8 lb

## 2022-03-30 DIAGNOSIS — I119 Hypertensive heart disease without heart failure: Secondary | ICD-10-CM

## 2022-03-30 DIAGNOSIS — R7309 Other abnormal glucose: Secondary | ICD-10-CM | POA: Diagnosis not present

## 2022-03-30 DIAGNOSIS — E6609 Other obesity due to excess calories: Secondary | ICD-10-CM

## 2022-03-30 DIAGNOSIS — Z6830 Body mass index (BMI) 30.0-30.9, adult: Secondary | ICD-10-CM

## 2022-03-30 MED ORDER — POTASSIUM CHLORIDE CRYS ER 20 MEQ PO TBCR: EXTENDED_RELEASE_TABLET | ORAL | 2 refills | Status: DC

## 2022-03-30 NOTE — Progress Notes (Signed)
I,Victoria T Hamilton,acting as a scribe for [name], MD.,have documented all relevant documentation on the behalf of [name], MD,as directed by  [name], MD while in the presence of [name], MD.   Subjective:     [name] , male    DOB: [birth year] , 71 y.o.   MRN: [id number]   Chief Complaint  Patient presents with   Hypertension    HPI  Patient is here for a hypertension follow up.  He reports compliance with meds. He denies headaches, chest pain and shortness of breath.  He has no specific concerns or complaints at this time.   Hypertension This is a chronic problem. The current episode started more than 1 year ago. The problem has been gradually improving since onset. The problem is controlled. Pertinent negatives include no shortness of breath. Risk factors for coronary artery disease include obesity and male gender. Past treatments include diuretics, calcium channel blockers and angiotensin blockers. The current treatment provides moderate improvement.     Past Medical History:  Diagnosis Date   GERD (gastroesophageal reflux disease)    H/O: CVA (cerebrovascular accident) 2003   Patient states the only residual deficit is an occasional stutter.   Hyperlipidemia    Hypertension    OSA (obstructive sleep apnea)    wears CPAP q night     Family History  Problem Relation Age of Onset   Cancer Mother    Stroke Father    Heart disease Father      Current Outpatient Medications:    aspirin 81 MG tablet, Take 81 mg by mouth daily., Disp: , Rfl:    atorvastatin (LIPITOR) 10 MG tablet, Take one tablet Mon, Wed, Friday daily with evening meal, Disp: 36 tablet, Rfl: 2   Cholecalciferol (VITAMIN D) 125 MCG (5000 UT) CAPS, Take 1 capsule by mouth daily., Disp: , Rfl:    EDARBYCLOR 40-25 MG TABS, TAKE ONE TABLET DAILY IN THE MORNING, Disp: 90 tablet, Rfl: 1   Omega-3 Fatty Acids (FISH OIL PO), Take by mouth. 1 daily, Disp: , Rfl:     spironolactone (ALDACTONE) 25 MG tablet, TAKE 1 TABLET BY MOUTH IN THE  MORNING, Disp: 100 tablet, Rfl: 2   triamcinolone cream (KENALOG) 0.1 %, Apply 1 application topically as needed., Disp: , Rfl:    verapamil (CALAN-SR) 240 MG CR tablet, TAKE 1 TABLET BY MOUTH TWICE  DAILY, Disp: 200 tablet, Rfl: 2   potassium chloride SA (KLOR-CON M) 20 MEQ tablet, TAKE 1&1/2 tabS by MOUTH daily, Disp: 135 tablet, Rfl: 2   No Known Allergies   Review of Systems  Constitutional: Negative.   Eyes: Negative.   Respiratory: Negative.  Negative for shortness of breath.   Cardiovascular: Negative.   Gastrointestinal: Negative.   Musculoskeletal: Negative.   Skin: Negative.   Neurological: Negative.   Psychiatric/Behavioral: Negative.       Today's Vitals   03/30/22 0919  BP: 118/84  Pulse: 79  Temp: 98.1 F (36.7 C)  SpO2: 98%  Weight: 246 lb 12.8 oz (111.9 kg)  Height: '6\' 3"'  (1.905 m)  PainSc: 0-No pain   Body mass index is 30.85 kg/m.  Wt Readings from Last 3 Encounters:  03/30/22 246 lb 12.8 oz (111.9 kg)  03/27/22 251 lb 6.4 oz (114 kg)  02/19/22 250 lb (113.4 kg)    Objective:  Physical Exam Vitals and nursing note reviewed.  Constitutional:      Appearance: Normal appearance.  HENT:     Head: Normocephalic and atraumatic.  Eyes:     Extraocular Movements: Extraocular movements intact.  Cardiovascular:     Rate and Rhythm: Normal rate and regular rhythm.     Heart sounds: Murmur heard.  Pulmonary:     Effort: Pulmonary effort is normal.     Breath sounds: Normal breath sounds.  Musculoskeletal:     Cervical back: Normal range of motion.  Skin:    General: Skin is warm.  Neurological:     General: No focal deficit present.     Mental Status: He is alert.  Psychiatric:        Mood and Affect: Mood normal.      Assessment And Plan:     1. Hypertensive heart disease without heart failure Comments: Chronic, well controlled. He will c/w Edarbyclor 40/21m, verapamil  2454mtwice daily and spironolactone 255maily. He will f/u in 6 months.  - CMP14+EGFR  2. Other abnormal glucose Comments: His a1c has been elevated in the past .I will recheck this today. He ise ncouraged to limit his intake of sweetened beverages, including diet drinks.  - Hemoglobin A1c  3. Class 1 obesity due to excess calories with serious comorbidity and body mass index (BMI) of 30.0 to 30.9 in adult Comments: His BMI is acceptable for his demographic. He is encouraged to aim for at least 150 minutes of exercise per week.   Patient was given opportunity to ask questions. Patient verbalized understanding of the plan and was able to repeat key elements of the plan. All questions were answered to their satisfaction.   I, RobMaximino GreenlandD, have reviewed all documentation for this visit. The documentation on 03/30/22 for the exam, diagnosis, procedures, and orders are all accurate and complete.   IF YOU HAVE BEEN REFERRED TO A SPECIALIST, IT MAY TAKE 1-2 WEEKS TO SCHEDULE/PROCESS THE REFERRAL. IF YOU HAVE NOT HEARD FROM US/SPECIALIST IN TWO WEEKS, PLEASE GIVE US [place] AT [phone] X [phone].   THE PATIENT IS ENCOURAGED TO PRACTICE SOCIAL DISTANCING DUE TO THE COVID-19 PANDEMIC.

## 2022-03-30 NOTE — Patient Instructions (Signed)
Hypertension, Adult ?Hypertension is another name for high blood pressure. High blood pressure forces your heart to work harder to pump blood. This can cause problems over time. ?There are two numbers in a blood pressure reading. There is a top number (systolic) over a bottom number (diastolic). It is best to have a blood pressure that is below 120/80. ?What are the causes? ?The cause of this condition is not known. Some other conditions can lead to high blood pressure. ?What increases the risk? ?Some lifestyle factors can make you more likely to develop high blood pressure: ?Smoking. ?Not getting enough exercise or physical activity. ?Being overweight. ?Having too much fat, sugar, calories, or salt (sodium) in your diet. ?Drinking too much alcohol. ?Other risk factors include: ?Having any of these conditions: ?Heart disease. ?Diabetes. ?High cholesterol. ?Kidney disease. ?Obstructive sleep apnea. ?Having a family history of high blood pressure and high cholesterol. ?Age. The risk increases with age. ?Stress. ?What are the signs or symptoms? ?High blood pressure may not cause symptoms. Very high blood pressure (hypertensive crisis) may cause: ?Headache. ?Fast or uneven heartbeats (palpitations). ?Shortness of breath. ?Nosebleed. ?Vomiting or feeling like you may vomit (nauseous). ?Changes in how you see. ?Very bad chest pain. ?Feeling dizzy. ?Seizures. ?How is this treated? ?This condition is treated by making healthy lifestyle changes, such as: ?Eating healthy foods. ?Exercising more. ?Drinking less alcohol. ?Your doctor may prescribe medicine if lifestyle changes do not help enough and if: ?Your top number is above 130. ?Your bottom number is above 80. ?Your personal target blood pressure may vary. ?Follow these instructions at home: ?Eating and drinking ? ?If told, follow the DASH eating plan. To follow this plan: ?Fill one half of your plate at each meal with fruits and vegetables. ?Fill one fourth of your plate  at each meal with whole grains. Whole grains include whole-wheat pasta, brown rice, and whole-grain bread. ?Eat or drink low-fat dairy products, such as skim milk or low-fat yogurt. ?Fill one fourth of your plate at each meal with low-fat (lean) proteins. Low-fat proteins include fish, chicken without skin, eggs, beans, and tofu. ?Avoid fatty meat, cured and processed meat, or chicken with skin. ?Avoid pre-made or processed food. ?Limit the amount of salt in your diet to less than 1,500 mg each day. ?Do not drink alcohol if: ?Your doctor tells you not to drink. ?You are pregnant, may be pregnant, or are planning to become pregnant. ?If you drink alcohol: ?Limit how much you have to: ?0-1 drink a day for women. ?0-2 drinks a day for men. ?Know how much alcohol is in your drink. In the [place] one drink equals one 12 oz bottle of beer (355 mL), one 5 oz glass of wine (148 mL), or one 1? oz glass of hard liquor (44 mL). ?Lifestyle ? ?Work with your doctor to stay at a healthy weight or to lose weight. Ask your doctor what the best weight is for you. ?Get at least 30 minutes of exercise that causes your heart to beat faster (aerobic exercise) most days of the week. This may include walking, swimming, or biking. ?Get at least 30 minutes of exercise that strengthens your muscles (resistance exercise) at least 3 days a week. This may include lifting weights or doing Pilates. ?Do not smoke or use any products that contain nicotine or tobacco. If you need help quitting, ask your doctor. ?Check your blood pressure at home as told by your doctor. ?Keep all follow-up visits. ?Medicines ?Take over-the-counter and prescription medicines   only as told by your doctor. Follow directions carefully. ?Do not skip doses of blood pressure medicine. The medicine does not work as well if you skip doses. Skipping doses also puts you at risk for problems. ?Ask your doctor about side effects or reactions to medicines that you should watch  for. ?Contact a doctor if: ?You think you are having a reaction to the medicine you are taking. ?You have headaches that keep coming back. ?You feel dizzy. ?You have swelling in your ankles. ?You have trouble with your vision. ?Get help right away if: ?You get a very bad headache. ?You start to feel mixed up (confused). ?You feel weak or numb. ?You feel faint. ?You have very bad pain in your: ?Chest. ?Belly (abdomen). ?You vomit more than once. ?You have trouble breathing. ?These symptoms may be an emergency. Get help right away. Call 911. ?Do not wait to see if the symptoms will go away. ?Do not drive yourself to the hospital. ?Summary ?Hypertension is another name for high blood pressure. ?High blood pressure forces your heart to work harder to pump blood. ?For most people, a normal blood pressure is less than 120/80. ?Making healthy choices can help lower blood pressure. If your blood pressure does not get lower with healthy choices, you may need to take medicine. ?This information is not intended to replace advice given to you by your health care provider. Make sure you discuss any questions you have with your health care provider. ?Document Revised: 05/08/2021 Document Reviewed: 05/08/2021 ?Elsevier Patient Education ? 2023 Elsevier Inc. ? ?

## 2022-03-31 LAB — CMP14+EGFR
ALT: 18 IU/L (ref 0–44)
AST: 19 IU/L (ref 0–40)
Albumin/Globulin Ratio: 1.2 (ref 1.2–2.2)
Albumin: 3.8 g/dL — ABNORMAL LOW (ref 3.9–4.9)
Alkaline Phosphatase: 56 IU/L (ref 44–121)
BUN/Creatinine Ratio: 11 (ref 10–24)
BUN: 11 mg/dL (ref 8–27)
Bilirubin Total: 0.3 mg/dL (ref 0.0–1.2)
CO2: 25 mmol/L (ref 20–29)
Calcium: 10.1 mg/dL (ref 8.6–10.2)
Chloride: 101 mmol/L (ref 96–106)
Creatinine, Ser: 0.97 mg/dL (ref 0.76–1.27)
Globulin, Total: 3.3 g/dL (ref 1.5–4.5)
Glucose: 114 mg/dL — ABNORMAL HIGH (ref 70–99)
Potassium: 3.6 mmol/L (ref 3.5–5.2)
Sodium: 140 mmol/L (ref 134–144)
Total Protein: 7.1 g/dL (ref 6.0–8.5)
eGFR: 85 mL/min/{1.73_m2} (ref >59)

## 2022-03-31 LAB — HEMOGLOBIN A1C
Est. average glucose Bld gHb Est-mCnc: 131 mg/dL
Hgb A1c MFr Bld: 6.2 % — ABNORMAL HIGH (ref 4.8–5.6)

## 2022-04-23 ENCOUNTER — Ambulatory Visit: Payer: Medicare Other

## 2022-04-23 DIAGNOSIS — I255 Ischemic cardiomyopathy: Secondary | ICD-10-CM

## 2022-04-23 DIAGNOSIS — I119 Hypertensive heart disease without heart failure: Secondary | ICD-10-CM

## 2022-04-23 DIAGNOSIS — E7800 Pure hypercholesterolemia, unspecified: Secondary | ICD-10-CM

## 2022-04-23 DIAGNOSIS — F17200 Nicotine dependence, unspecified, uncomplicated: Secondary | ICD-10-CM

## 2022-04-27 NOTE — Progress Notes (Unsigned)
ICD-10-CM   1. Ischemic cardiomyopathy  I25.5 PCV MYOCARDIAL PERFUSION WO LEXISCAN    2. Hypertensive heart disease without heart failure  I11.9 PCV ECHOCARDIOGRAM COMPLETE    PCV MYOCARDIAL PERFUSION WO LEXISCAN    3. Pure hypercholesterolemia  E78.00 PCV MYOCARDIAL PERFUSION WO LEXISCAN    4. Tobacco use disorder  F17.200 PCV MYOCARDIAL PERFUSION WO LEXISCAN

## 2022-04-27 NOTE — Progress Notes (Signed)
Please call him and let him know that I see mild abnormalities in his echocardiogram, I would like him to do a stress test, this has been ordered.  Make sure that the friend has scheduled him for a stress test.

## 2022-04-27 NOTE — Progress Notes (Signed)
Called pt to inform him about the echo and pt has an appt for 10/10 '@8'$ :30. Pt understood

## 2022-05-02 ENCOUNTER — Encounter: Payer: Self-pay | Admitting: Internal Medicine

## 2022-05-07 NOTE — Patient Instructions (Signed)

## 2022-05-07 NOTE — Progress Notes (Signed)
PATIENT: Terry Andrews DOB: 29-Jul-1951  REASON FOR VISIT: follow up HISTORY FROM: patient  Chief Complaint  Patient presents with   Follow-up    Pt in room #1 and alone. Pt here today for f/u sleep apnea.    HISTORY OF PRESENT ILLNESS:  05/12/22 ALL: Terry Andrews returns for follow up for OSA on CPAP. He reports doing very well. He tells me, today, that he received a new CPAP machine since last visit 05/2021. He is uncertain when new machine was set up but reports machine being delivered through [facility]. He is tolerating new machine. He is using nasal pillows and feels they are comfortable. He has not noticed much air leaking from mask.    Per our doumentation, last machine was set up 01/2015.     05/08/2021 ALL: Terry Andrews returns for follow up for OSA on CPAP. He continues to do very well on CPAP therapy. He is using his machine every night. He feels better rested when using CPAP. He denies concerns with machine or supplies.   Set up date 01/2015. He feels his machine is working just fine and does not want to order a new one.     05/08/2020 ALL:  Terry Andrews is a 71 y.o. male here today for follow up for OSA on CPAP.  He continues to do very well with CPAP therapy.  He reports that he cannot sleep without his machine.  He did recently receive a new CPAP as his previous machine was shattered after dropping it in the bathroom.  He denies any difficulty with his new CPAP machine.  Compliance report dated 04/06/2020 through 05/05/2020 reveals that he used CPAP 30 of the past 30 days for compliance of 100%.  He used CPAP greater than 4 hours all 30 days.  Average usage was 7 hours and 22 minutes.  Residual AHI was 0.4 on 5 to 15 cm of water and EPR of 3.  There was a leak noted in the 95th percentile of 26.2 L/min.  He is using nasal pillows.  He does switch these out frequently as he continues to note leak at home.  He is not bothered by the sleep.   HISTORY: (copied from Dr Guadelupe Sabin note on  05/08/2019)  Terry Andrews is a very pleasant 71 year old right-handed gentleman with an underlying medical history of reflux disease, hypertension, smoking, heart disease, remote Hx of right pontine stroke in 2003, remote history of substance abuse (2003), and obesity, who presents for follow-up consultation of his OSA, on treatment with AutoPap therapy. The patient is unaccompanied today and presents for his yearly checkup.  I last saw him on 05/05/2018, at which time he was compliant with his AutoPap therapy.  He had a recent stressor as his fianc had a stroke. He was advised to follow-up in 1 year.   Today, 05/08/2019: I reviewed his AutoPap compliance data from 04/03/2019 through 05/02/2019 which is a total of 30 days, during which time he used his AutoPap 28 days with percent use days greater than 4 hours at 93%, indicating excellent compliance with an average usage of 8 hours and 20 minutes, residual AHI at goal at 0.3/h, 95th percentile of pressure at 8.2 cm with a range of 5 cm to 15 cm with EPR.  Leak on the high side with a 95th percentile at 25 L/min.  He reports doing well, notices less good sleep when he skips his AutoPap.  Generally, he is very compliant with treatment, uses  nasal pillows with good success, is not particularly bothered by air leakage.  He is up-to-date with his supplies through [facility].  He saw his cardiologist recently and was started on potassium.    Previously:    I saw him on 05/05/2016, at which time he was compliant with his AutoPap. He was advised to be fully compliant with AutoPap, and encouraged to quit smoking cigarettes and Marijuana, and limit his alcohol intake.   He saw Cecille Rubin, nurse practitioner in the interim on 05/05/2017, at which time he was compliant with his AutoPap and advised to follow-up in one year routinely.   I reviewed his AutoPap compliance data from 04/03/2018 through 05/02/2018 which is a total of 30 days, during which time he  used his AutoPap 28 days with percent used days greater than 4 hours at 90%, indicating excellent compliance with an average usage of 8 hours and 8 minutes, residual AHI at goal at 0.4 per hour, leak high with the 95th percentile at 28.1 L/m, 95th percentile of pressure at 7.4 cm with a range of 5 cm to 15 cm with EPR.    I saw him on 05/06/2015, at which time he reported doing well. He liked his new CPAP machine. He had some issues with knee pain and back pain. He was seen a chiropractor for this. His cardiologist wanted to see him in one year. He was not quite ready to quit smoking. He was fully compliant with CPAP therapy.   05/05/2016: I reviewed his autoPAP compliance data from 04/03/2016 through 05/02/2016 which is a total of 30 days, during which time he used his machine 29 days with percent used days greater than 4 hours at 97%, indicating excellent compliance with an average usage of 7 hours and 15 minutes, residual AHI low at 0.3 per hour. 95th percentile pressure at 8.6 cm, leak acceptable with the 95th percentile at 19.1 L/m, pressure setting of 5-15 cm with EPR.       I saw him on 01/04/2015, at which time he reported feeling about the same. He did not bring his machine or SD card. He had no new sleep related complaints. He had lost a few pounds. He was feeling better with CPAP and was using his old machine. I suggested we start him on AutoPap therapy and provide him with a new machine. He reported that his brother has severe OSA and uses a CPAP too.    I reviewed his CPAP compliance data from 04/01/2015 through 04/30/2015 which is a total of 30 days during which time he used his machine every night except for 1, with percent used days greater than 4 hours at 97%, indicating excellent compliance with an average usage of 6 hours and 38 minutes, residual AHI low at 0.5 per hour, leak acceptable with the 95th percentile at 21.9 L/m, borderline, and 95th percentile of pressure at 8.8 cm, setting  of AutoPap pap with a range of 5-15 cm with EPR.     I first met him on 11/15/2014 at the request of his cardiologist, at which time the patient reported a prior diagnosis of obstructive sleep apnea and treatment with CPAP for the past 10 years. He had noticed problems with his machine. Prior sleep test results were not available for me. His sleep study was over 10 years ago. I suggested he return for a potential split-night sleep study. He ended up having a baseline sleep study on 11/22/2014 and I went over his test  results with him in detail today. His sleep efficiency was reduced markedly at 53%. Of note the patient had difficulty maintaining sleep and particularly had a hard time going back to sleep after 2:15 AM. Sleep latency was normal but wake after sleep onset was nearly 3 hours. There was mild to moderate sleep fragmentation. He had an increased percentage of stage II sleep, and a decreased percentage of REM sleep at 9.7%. He had no significant PLMS. He had occasional PVCs on single-lead EKG. He had mild intermittent snoring. Total AHI was borderline at 4.5 per hour, rising to 32.2 per hour during REM sleep and 6.7 per hour in the supine position. He slept mostly in the supine position. Average oxygen saturation was 90% but there were faulty readings as well on the pulse oximeter throughout the night.      He was diagnosed with obstructive sleep apnea about 10 years ago. He was placed on CPAP therapy. He has been compliant with treatment. About 2 or 3 months ago he started noticing that the machine was making a lot of noise and he actually quit using it. You encouraged him to restart using it and he also had a bad cough and bout of congestion which since then has improved but it made it hard for him to be compliant with CPAP therapy at the time. All in all he estimates that he has over the course of 10 years always been compliant with treatment about 85% of the time. He uses nasal pillows. He did not  bring his machine today.   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 He is single. He has 2 grown daughters. He is the youngest of a total of 7 children. One sister died of breast cancer. Another sister has breast cancer. His brother has sleep apnea and encouraged him to get tested in the past. He still smokes and is not motivated to quit. He lost some weight with your encouragement. He does not have a set bedtime and wake time routine. He drinks caffeine in the form of coffee, tea and sodas throughout the day. He has a TV on at night throughout the night typically. He likes to sleep on his sides. He likes to take a nap in the afternoons. He is not known to have any parasomnias. He denies restless leg symptoms, morning headaches and nocturia. He wakes up typically quite well rested when he is able to use CPAP but again lately, his machine has been defective.   His DME company is [facility].    Prior sleep test results are not available for my review today. I reviewed your office note from 10/15/2014 which you kindly included. Prior test results were also reviewed including carotid Doppler studies from 04/13/2011 which showed no significant stenosis of either internal carotid artery. Echocardiogram from 04/16/2011 showed normal EF, moderate LVH, grade 2 diastolic dysfunction. According to your office note he was diagnosed with obstructive sleep apnea on 05/21/2015 with a sleep study. Labs from 06/07/2014 showed normal TSH, A1c of 6.6, total cholesterol of 130, LDL at 71, CMP and CBC normal. EKG from 05/03/2014 showed normal sinus rhythm with borderline first-degree AV block and some PVCs. His CPAP has been nearly 69 years old and he has not had re-evaluation of his OSA and nearly 10 years.   REVIEW OF SYSTEMS: Out of a complete 14 system review of symptoms, the patient complains only  of the following symptoms, none and all other reviewed  systems are negative.  ESS: 8  ALLERGIES: No Known Allergies  HOME MEDICATIONS: Outpatient Medications Prior to Visit  Medication Sig Dispense Refill   aspirin 81 MG tablet Take 81 mg by mouth daily.     atorvastatin (LIPITOR) 10 MG tablet Take one tablet Mon, Wed, Friday daily with evening meal 36 tablet 2   Cholecalciferol (VITAMIN D) 125 MCG (5000 UT) CAPS Take 1 capsule by mouth daily.     EDARBYCLOR 40-25 MG TABS TAKE ONE TABLET DAILY IN THE MORNING 90 tablet 1   Omega-3 Fatty Acids (FISH OIL PO) Take by mouth. 1 daily     potassium chloride SA (KLOR-CON M) 20 MEQ tablet TAKE 1&1/2 tabS by MOUTH daily 135 tablet 2   spironolactone (ALDACTONE) 25 MG tablet TAKE 1 TABLET BY MOUTH IN THE  MORNING 100 tablet 2   triamcinolone cream (KENALOG) 0.1 % Apply 1 application topically as needed.     verapamil (CALAN-SR) 240 MG CR tablet TAKE 1 TABLET BY MOUTH TWICE  DAILY 200 tablet 2   No facility-administered medications prior to visit.    PAST MEDICAL HISTORY: Past Medical History:  Diagnosis Date   GERD (gastroesophageal reflux disease)    H/O: CVA (cerebrovascular accident) 2003   Patient states the only residual deficit is an occasional stutter.   Hyperlipidemia    Hypertension    OSA (obstructive sleep apnea)    wears CPAP q night    PAST SURGICAL HISTORY: Past Surgical History:  Procedure Laterality Date   ANAL FISTULOTOMY N/A 05/29/2021   Procedure: WIDE EXCISION OF HYDRADINITIS;  Surgeon: Leighton Ruff, MD;  Location: [facility];  Service: General;  Laterality: N/A;   COLONOSCOPY  12/21/2019    FAMILY HISTORY: Family History  Problem Relation Age of Onset   Cancer Mother    Stroke Father    Heart disease Father     SOCIAL HISTORY: Social History   Socioeconomic History   Marital status: Single    Spouse name: Not on file   Number of children: 2   Years of education: College   Highest education level: Not on file  Occupational History    Occupation: retired  Tobacco Use   Smoking status: Every Day    Packs/day: 0.25    Years: 52.00    Total pack years: 13.00    Types: Cigarettes    Start date: 1965   Smokeless tobacco: Never   Tobacco comments:    he has cut back number cigs/smoked,   Vaping Use   Vaping Use: Never used  Substance and Sexual Activity   Alcohol use: Yes    Alcohol/week: 0.0 standard drinks of alcohol    Comment: Occasional    Drug use: Yes    Types: Marijuana    Comment: every once in a while, (as of 05/22/21 patient last smoked marijauna on 05/21/2021)   Sexual activity: Yes  Other Topics Concern   Not on file  Social History Narrative   Some caffeine use    Social Determinants of Health   Financial Resource Strain: Low Risk  (02/19/2022)   Overall Financial Resource Strain (CARDIA)    Difficulty of Paying Living Expenses: Not hard at all  Food Insecurity: No Food Insecurity (02/19/2022)   Hunger Vital Sign    Worried About Running Out of Food in the Last Year: Never true    Ran Out of Food in the Last Year: Never  true  Transportation Needs: No Transportation Needs (02/19/2022)   PRAPARE - Hydrologist (Medical): No    Lack of Transportation (Non-Medical): No  Physical Activity: Inactive (02/19/2022)   Exercise Vital Sign    Days of Exercise per Week: 0 days    Minutes of Exercise per Session: 0 min  Stress: No Stress Concern Present (02/19/2022)   [place]    Feeling of Stress : Not at all  Social Connections: Not on file  Intimate Partner Violence: Not At Risk (01/10/2019)   Humiliation, Afraid, Rape, and Kick questionnaire    Fear of Current or Ex-Partner: No    Emotionally Abused: No    Physically Abused: No    Sexually Abused: No      PHYSICAL EXAM  Vitals:   05/12/22 1254  BP: (!) 143/71  Pulse: 77  Weight: 244 lb 8 oz (110.9 kg)  Height: '6\' 3"'  (1.905 m)     Body mass  index is 30.56 kg/m.  Generalized: Well developed, in no acute distress  Cardiology: normal rate and rhythm, no murmur noted Respiratory: clear to auscultation bilaterally  Neurological examination  Mentation: Alert oriented to time, place, history taking. Follows all commands speech and language fluent Cranial nerve II-XII: Pupils were equal round reactive to light. Extraocular movements were full, visual field were full  Motor: The motor testing reveals 5 over 5 strength of all 4 extremities. Good symmetric motor tone is noted throughout.  Gait and station: Gait is normal.   DIAGNOSTIC DATA (LABS, IMAGING, TESTING) - I reviewed patient records, labs, notes, testing and imaging myself where available.      No data to display           Lab Results  Component Value Date   WBC 6.6 03/24/2021   HGB 14.6 05/29/2021   HCT 43.0 05/29/2021   MCV 93 03/24/2021   PLT 274 03/24/2021      Component Value Date/Time   NA 140 03/30/2022 1023   K 3.6 03/30/2022 1023   CL 101 03/30/2022 1023   CO2 25 03/30/2022 1023   GLUCOSE 114 (H) 03/30/2022 1023   GLUCOSE 115 (H) 05/29/2021 0604   BUN 11 03/30/2022 1023   CREATININE 0.97 03/30/2022 1023   CALCIUM 10.1 03/30/2022 1023   PROT 7.1 03/30/2022 1023   ALBUMIN 3.8 (L) 03/30/2022 1023   AST 19 03/30/2022 1023   ALT 18 03/30/2022 1023   ALKPHOS 56 03/30/2022 1023   BILITOT 0.3 03/30/2022 1023   GFRNONAA 84 09/23/2020 0932   GFRAA 97 09/23/2020 0932   Lab Results  Component Value Date   CHOL 105 12/23/2021   HDL 52 12/23/2021   LDLCALC 42 12/23/2021   TRIG 43 12/23/2021   CHOLHDL 2.0 12/23/2021   Lab Results  Component Value Date   HGBA1C 6.2 (H) 03/30/2022   No results found for: "VITAMINB12" Lab Results  Component Value Date   TSH 0.786 09/23/2020       ASSESSMENT AND PLAN 71 y.o. year old male  has a past medical history of GERD (gastroesophageal reflux disease), H/O: CVA (cerebrovascular accident) (2003),  Hyperlipidemia, Hypertension, and OSA (obstructive sleep apnea). here with     ICD-10-CM   1. Obstructive sleep apnea treated with continuous positive airway pressure (CPAP)  G47.33 For home use only DME continuous positive airway pressure (CPAP)      Shaquon is doing very well on CPAP therapy.  Compliance report reveals excellent compliance.  We have discussed concerns of continued leak.  He is not bothered by this and residual AHI is well managed.  He was encouraged to continue using CPAP nightly and for greater than 4 hours each night. He tells me that he has received a new CPAP machine, however, we do not have any documentation of this. I will contact DME to clarify. He will follow-up with [name] in 1 year, sooner if needed.  He verbalizes understanding and agreement with this plan.   Orders Placed This Encounter  Procedures   For home use only DME continuous positive airway pressure (CPAP)    Supplies    Order Specific Question:   Length of Need    Answer:   Lifetime    Order Specific Question:   Patient has OSA or probable OSA    Answer:   Yes    Order Specific Question:   Is the patient currently using CPAP in the home    Answer:   Yes    Order Specific Question:   Settings    Answer:   Other see comments    Order Specific Question:   CPAP supplies needed    Answer:   Mask, headgear, cushions, filters, heated tubing and water chamber      No orders of the defined types were placed in this encounter.      Debbora Presto, FNP-C 05/12/2022, 1:18 PM [facility] [address] [place], [place] [address] [phone]

## 2022-05-08 DIAGNOSIS — G4733 Obstructive sleep apnea (adult) (pediatric): Secondary | ICD-10-CM | POA: Diagnosis not present

## 2022-05-12 ENCOUNTER — Ambulatory Visit: Payer: Medicare Other

## 2022-05-12 ENCOUNTER — Telehealth: Payer: Self-pay

## 2022-05-12 ENCOUNTER — Ambulatory Visit: Payer: Medicare Other | Admitting: Family Medicine

## 2022-05-12 ENCOUNTER — Encounter: Payer: Self-pay | Admitting: Family Medicine

## 2022-05-12 VITALS — BP 143/71 | HR 77 | Ht 75.0 in | Wt 244.5 lb

## 2022-05-12 DIAGNOSIS — G4733 Obstructive sleep apnea (adult) (pediatric): Secondary | ICD-10-CM

## 2022-05-12 DIAGNOSIS — E7800 Pure hypercholesterolemia, unspecified: Secondary | ICD-10-CM

## 2022-05-12 DIAGNOSIS — F17200 Nicotine dependence, unspecified, uncomplicated: Secondary | ICD-10-CM

## 2022-05-12 DIAGNOSIS — I119 Hypertensive heart disease without heart failure: Secondary | ICD-10-CM

## 2022-05-12 DIAGNOSIS — E119 Type 2 diabetes mellitus without complications: Secondary | ICD-10-CM | POA: Diagnosis not present

## 2022-05-12 DIAGNOSIS — I255 Ischemic cardiomyopathy: Secondary | ICD-10-CM

## 2022-05-12 NOTE — Progress Notes (Signed)
Please let him know that the stress test was abnormal and I would like to see him in the office. Not urgent

## 2022-05-13 NOTE — Progress Notes (Signed)
Called and spoke with patient regarding him scheduling a sooner appointment. Call transferred to front desk staff.

## 2022-06-08 DIAGNOSIS — G4733 Obstructive sleep apnea (adult) (pediatric): Secondary | ICD-10-CM | POA: Diagnosis not present

## 2022-06-10 ENCOUNTER — Ambulatory Visit: Payer: Medicare Other | Admitting: Cardiology

## 2022-06-10 ENCOUNTER — Encounter: Payer: Self-pay | Admitting: Cardiology

## 2022-06-10 VITALS — BP 117/68 | HR 75 | Temp 97.8°F | Resp 16 | Ht 75.0 in | Wt 244.8 lb

## 2022-06-10 DIAGNOSIS — I119 Hypertensive heart disease without heart failure: Secondary | ICD-10-CM | POA: Diagnosis not present

## 2022-06-10 DIAGNOSIS — R9439 Abnormal result of other cardiovascular function study: Secondary | ICD-10-CM | POA: Diagnosis not present

## 2022-06-10 DIAGNOSIS — E7800 Pure hypercholesterolemia, unspecified: Secondary | ICD-10-CM

## 2022-06-10 DIAGNOSIS — R0609 Other forms of dyspnea: Secondary | ICD-10-CM | POA: Diagnosis not present

## 2022-06-10 NOTE — Progress Notes (Signed)
Primary Physician/Referring:  Glendale Chard, MD  Patient ID: Dia Sitter, male    DOB: 27-May-1951, 71 y.o.   MRN: [id number]   Chief Complaint  Patient presents with   Hypertensive heart disease without heart failure   Follow-up    HPI:    ORIAN FIGUEIRA  is a 71 y.o. African-American male with hypertension, history of remote stroke in 2003 with no residual defects probably related to hypertension, tobacco use disorder, obstructive sleep apnea on CPAP, seen 2 months ago, and echocardiogram was ordered.  This revealed wall motion abnormality and mildly reduced LVEF.  Hence underwent nuclear stress testing which is abnormal, he now presents for follow-up.  Except for mild chronic dyspnea that he had not stated much before, states that he is doing well.  Still able to do most of his physical activity without any significant limitations.  No PND or orthopnea or leg edema.  Past Medical History:  Diagnosis Date   Diabetes mellitus without complication ([place])    GERD (gastroesophageal reflux disease)    H/O: CVA (cerebrovascular accident) 2003   Patient states the only residual deficit is an occasional stutter.   Hyperlipidemia    Hypertension    OSA (obstructive sleep apnea)    wears CPAP q night   Past Surgical History:  Procedure Laterality Date   ANAL FISTULOTOMY N/A 05/29/2021   Procedure: WIDE EXCISION OF HYDRADINITIS;  Surgeon: Leighton Ruff, MD;  Location: [facility];  Service: General;  Laterality: N/A;   COLONOSCOPY  12/21/2019   Family History  Problem Relation Age of Onset   Cancer Mother    Stroke Father    Heart disease Father     Social History   Tobacco Use   Smoking status: Every Day    Packs/day: 0.25    Years: 52.00    Total pack years: 13.00    Types: Cigarettes    Start date: 1965   Smokeless tobacco: Never   Tobacco comments:    he has cut back number cigs/smoked,   Substance Use Topics   Alcohol use: Yes    Alcohol/week: 0.0  standard drinks of alcohol    Comment: Occasional    Marital Status: Single  ROS  Review of Systems  Cardiovascular:  Positive for dyspnea on exertion. Negative for chest pain and leg swelling.  Gastrointestinal:  Negative for melena.   Objective  Blood pressure 117/68, pulse 75, temperature 97.8 F (36.6 C), temperature source Temporal, resp. rate 16, height _0  (1.905 m), weight 244 lb 12.8 oz (111 kg), SpO2 98 %.      06/10/2022    2:51 PM 05/12/2022   12:54 PM 03/30/2022    9:19 AM  Vitals with BMI  Height _1  _2  _3   Weight 244 lbs 13 oz 244 lbs 8 oz 246 lbs 13 oz  BMI 30.6 66.59 93.57  Systolic 017 793 903  Diastolic 68 71 84  Pulse 75 77 79     Physical Exam HENT:     Head: Atraumatic.  Neck:     Vascular: No carotid bruit or JVD.  Cardiovascular:     Rate and Rhythm: Normal rate and regular rhythm.     Pulses: Intact distal pulses.     Heart sounds: S1 normal and S2 normal. Murmur heard.     Systolic murmur is present with a grade of 2/6 at the upper right sternal border.     No gallop.  Pulmonary:  Effort: Pulmonary effort is normal.     Breath sounds: Normal breath sounds.  Abdominal:     General: Bowel sounds are normal.     Palpations: Abdomen is soft.  Musculoskeletal:     Right lower leg: No edema.     Left lower leg: No edema.    Laboratory examination:   Lab Results  Component Value Date   NA 140 03/30/2022   K 3.6 03/30/2022   CO2 25 03/30/2022   GLUCOSE 114 (H) 03/30/2022   BUN 11 03/30/2022   CREATININE 0.97 03/30/2022   CALCIUM 10.1 03/30/2022   EGFR 85 03/30/2022   GFRNONAA 84 09/23/2020   CrCl cannot be calculated (Patient's most recent lab result is older than the maximum 21 days allowed.).     Latest Ref Rng & Units 03/30/2022   10:23 AM 12/23/2021    3:47 PM 09/15/2021   10:08 AM  CMP  Glucose 70 - 99 mg/dL 114   138   BUN 8 - 27 mg/dL 11   14   Creatinine 0.76 - 1.27 mg/dL 0.97   1.00   Sodium 134 - 144 mmol/L  140   139   Potassium 3.5 - 5.2 mmol/L 3.6   3.4   Chloride 96 - 106 mmol/L 101   102   CO2 20 - 29 mmol/L 25   23   Calcium 8.6 - 10.2 mg/dL 10.1   9.8   Total Protein 6.0 - 8.5 g/dL 7.1   6.8   Total Bilirubin 0.0 - 1.2 mg/dL 0.3   <0.2   Alkaline Phos 44 - 121 IU/L 56   59   AST 0 - 40 IU/L 19   21   ALT 0 - 44 IU/L _0 Latest Ref Rng & Units 05/29/2021    6:04 AM 03/24/2021    9:54 AM 03/26/2020    4:49 PM  CBC  WBC 3.4 - 10.8 x10E3/uL  6.6  9.3   Hemoglobin 13.0 - 17.0 g/dL 14.6  14.9  14.2   Hematocrit 39.0 - 52.0 % 43.0  43.9  42.5   Platelets 150 - 450 x10E3/uL  274  256     Lipid Panel Recent Labs    09/15/21 1008 12/23/21 1547  CHOL 117 105  TRIG 56 43  LDLCALC 54 42  HDL 50 52  CHOLHDL 2.3 2.0    HEMOGLOBIN A1C Lab Results  Component Value Date   HGBA1C 6.2 (H) 03/30/2022   TSH No results for input(s): "TSH" in the last 8760 hours.  Medications and allergies  No Known Allergies   Current Outpatient Medications:    aspirin 81 MG tablet, Take 81 mg by mouth daily., Disp: , Rfl:    atorvastatin (LIPITOR) 10 MG tablet, Take one tablet Mon, Wed, Friday daily with evening meal, Disp: 36 tablet, Rfl: 2   Cholecalciferol (VITAMIN D) 125 MCG (5000 UT) CAPS, Take 1 capsule by mouth daily., Disp: , Rfl:    EDARBYCLOR 40-25 MG TABS, TAKE ONE TABLET DAILY IN THE MORNING, Disp: 90 tablet, Rfl: 1   Omega-3 Fatty Acids (FISH OIL PO), Take by mouth. 1 daily, Disp: , Rfl:    potassium chloride SA (KLOR-CON M) 20 MEQ tablet, TAKE 1&1/2 tabS by MOUTH daily, Disp: 135 tablet, Rfl: 2   spironolactone (ALDACTONE) 25 MG tablet, TAKE 1 TABLET BY MOUTH IN THE  MORNING, Disp: 100 tablet, Rfl: 2   triamcinolone cream (KENALOG)  0.1 %, Apply 1 application topically as needed., Disp: , Rfl:    verapamil (CALAN-SR) 240 MG CR tablet, TAKE 1 TABLET BY MOUTH TWICE  DAILY, Disp: 200 tablet, Rfl: 2  Radiology:   No results found.  Cardiac Studies:   Abdominal Aortic  Duplex  06/07/2019:  Normal abdominal aorta. No evidence of aneurysm.  Incidental 1.3x1.6 heterogeneous mass right kidney apex noted, consider  dedicated US of the kidneys.  See attached image. (will be ordered)  PCV MYOCARDIAL PERFUSION WO LEXISCAN 05/12/2022  Narrative Exercise nuclear stress test 05/12/2022: There is a fixed severe defect in the anterior, inferior, septal and apical regions. LV is mildly dilated in both rest and stress. Overall LV systolic function is abnormal without regional wall motion abnormalities. Mild global hypokinesis of the left ventricle. Stress LV EF: 26%. Normal ECG stress. The patient exercised for 4 minutes and 57 seconds of a Bruce protocol, achieving approximately 6.94 METs. and achieved 88% of MPHR. The blood pressure response was normal. High risk study.  NO prior studies for comparison.  PCV ECHOCARDIOGRAM COMPLETE 04/23/2022  Narrative Echocardiogram 04/23/2022: Mildly depressed LV systolic function with visual EF 45-50%. Left ventricle cavity is normal in size. Normal left ventricular wall thickness. Doppler evidence of grade I (impaired) diastolic dysfunction, normal LAP. Left ventricle regional wall motion findings: Basal inferior, Mid inferior, Mid inferoseptal, Apical anterior, Apical inferior, Apical septal and Apical cap hypokinesis. No significant valvular heart disease. Compared to 04/15/2017 LVEF reduced from 50-55% to 45-50%, RWMA are new, otherwise no significant change.     EKG  EKG 03/27/2022: Sinus rhythm with first AV block 75 bpm, normal axis, LVH.  1 pair of Ventricular couplets, multifocal.  Nonspecific T abnormality.  No significant change from 03/26/2021, PVCs  Assessment     ICD-10-CM   1. Hypertensive heart disease without heart failure  I11.9     2. Abnormal nuclear stress test  R94.39     3. Dyspnea on exertion  R06.09     4. Pure hypercholesterolemia  E78.00        There are no discontinued medications.   No  orders of the defined types were placed in this encounter.  Recommendations:   ILYA NEELY is a 71 y.o. African-American male with hypertension, history of remote stroke in 2003 with no residual defects probably related to hypertension, tobacco use disorder, obstructive sleep apnea on CPAP, seen 2 months ago, and echocardiogram was ordered.  This revealed wall motion abnormality and mildly reduced LVEF.  Hence underwent nuclear stress testing which is abnormal, he now presents for follow-up.  1. Abnormal nuclear stress test Abnormal nuclear stress test, high risk in the anterior and inferoseptal regions, also relative correlation with echocardiographic findings as well.  In view of hypertension, hyperlipidemia, diabetes mellitus/prediabetes.  This, ongoing tobacco use disorder, he would certainly be a high risk individual and has not had any cardiac work-up in many years.  We discussed regarding cardiac catheterization.  Patient is taking care of his fiance that has recently had intracerebral bleed, he would like to wait for 2 months prior to proceeding with cardiac catheterization and he understands the risks and benefits and wants to proceed with this.  I did not make any changes to his medication however would like to see him back sometime in January 2024 prior to the procedure.  2. Dyspnea on exertion His symptoms of dyspnea on exertion could be anginal equivalent.  This been stable and nothing alarming, class II symptoms only.  No PND or orthopnea.  No clinical evidence heart failure.  3. Hypertensive heart disease without heart failure Since being on medical therapy, he has not had any exacerbation of heart failure, tolerating all his medications well, blood pressure is under excellent control.  4. Pure hypercholesterolemia I reviewed his lipids, again excellent control.  Overall all his risk factors are well controlled except tobacco use disorder that is ongoing.  I discussed this again  with the patient.    Adrian Prows, MD, [facility] 06/10/2022, 4:10 PM Office: [phone]

## 2022-06-18 DIAGNOSIS — H2513 Age-related nuclear cataract, bilateral: Secondary | ICD-10-CM | POA: Diagnosis not present

## 2022-06-18 DIAGNOSIS — H0288A Meibomian gland dysfunction right eye, upper and lower eyelids: Secondary | ICD-10-CM | POA: Diagnosis not present

## 2022-06-18 DIAGNOSIS — H0288B Meibomian gland dysfunction left eye, upper and lower eyelids: Secondary | ICD-10-CM | POA: Diagnosis not present

## 2022-06-18 DIAGNOSIS — H04123 Dry eye syndrome of bilateral lacrimal glands: Secondary | ICD-10-CM | POA: Diagnosis not present

## 2022-06-21 IMAGING — CT CT CHEST LUNG CANCER SCREENING LOW DOSE W/O CM
1 series · 10 of 10 positions shown, 13 images · non-contrast
Comparison: None.

CLINICAL DATA: Current smoker, 26 pack-year history.



[ct lung segmentation data · axial · 0.79mm/px · z∈[+1004,+1004]mm · 10 of 357 frames shown]
[frame 1/357  mediastinal]
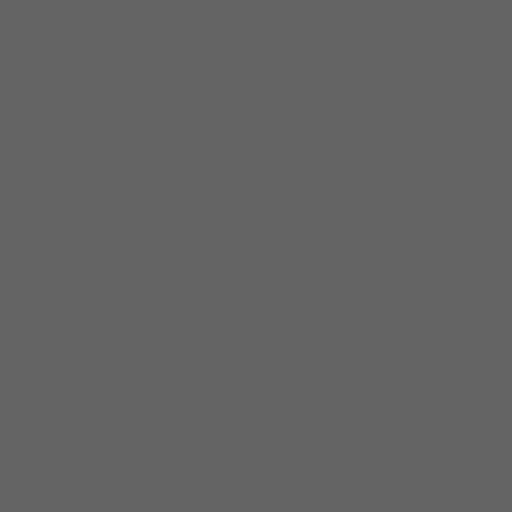
[frame 1/357  lung]
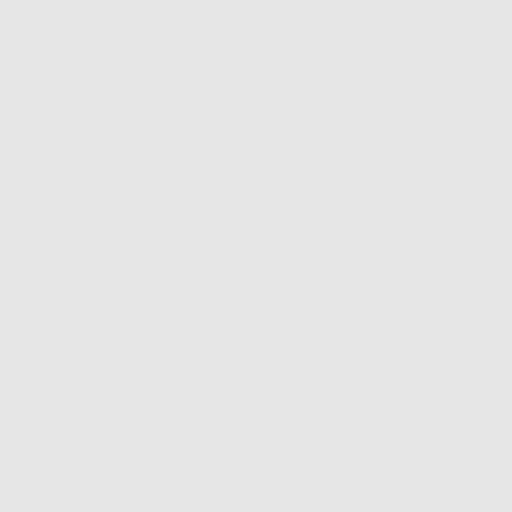
[frame 40/357  lung]
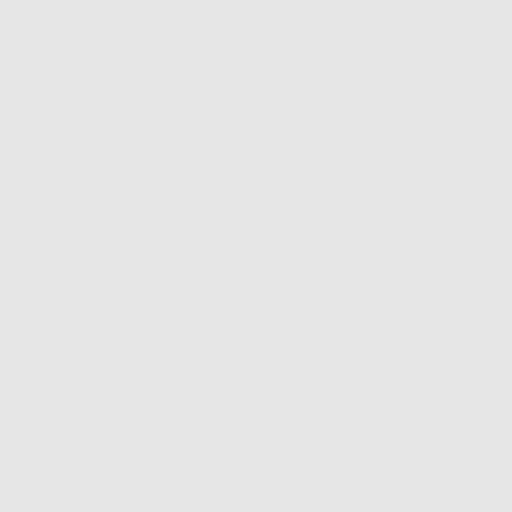
[frame 80/357  lung]
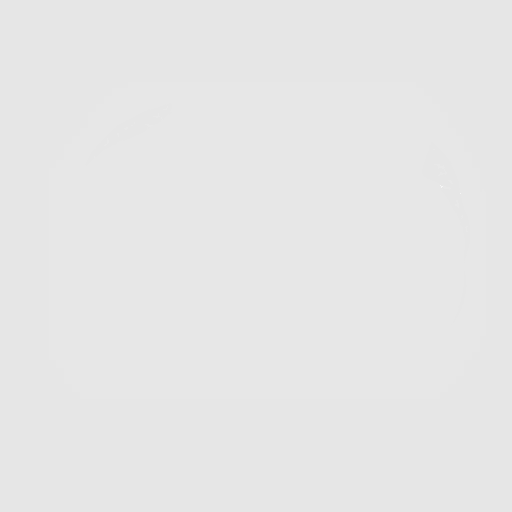
[frame 119/357  lung]
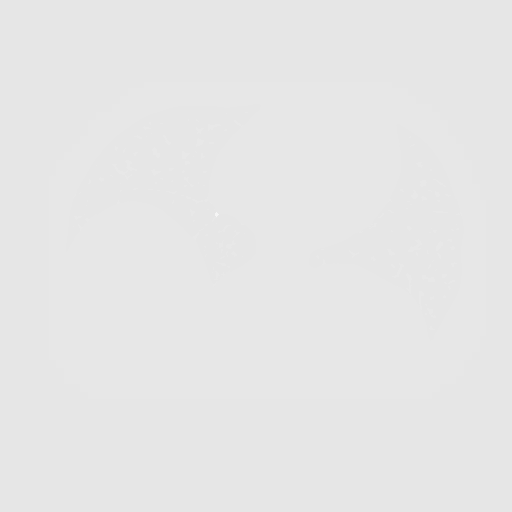
[frame 159/357  mediastinal]
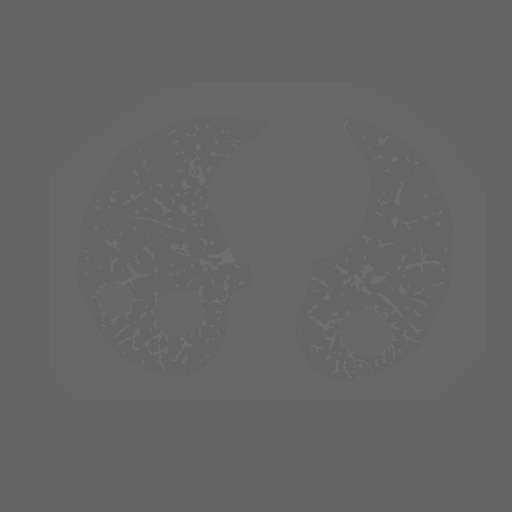
[frame 159/357  lung]
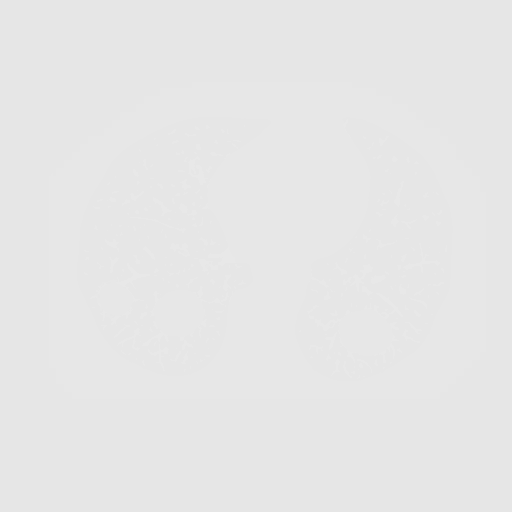
[frame 198/357  lung]
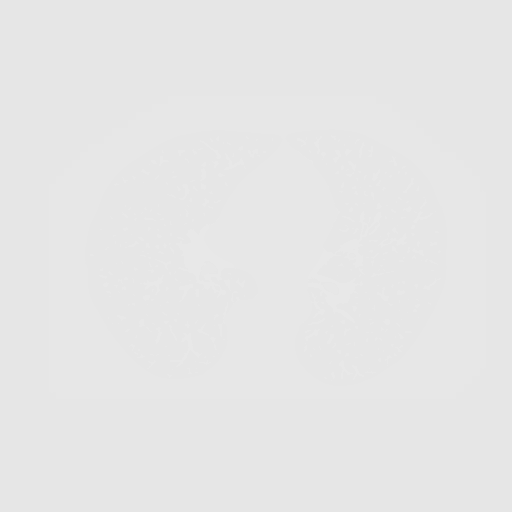
[frame 238/357  lung]
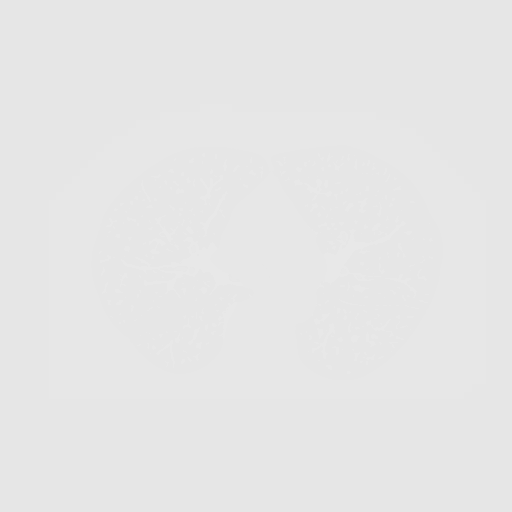
[frame 277/357  lung]
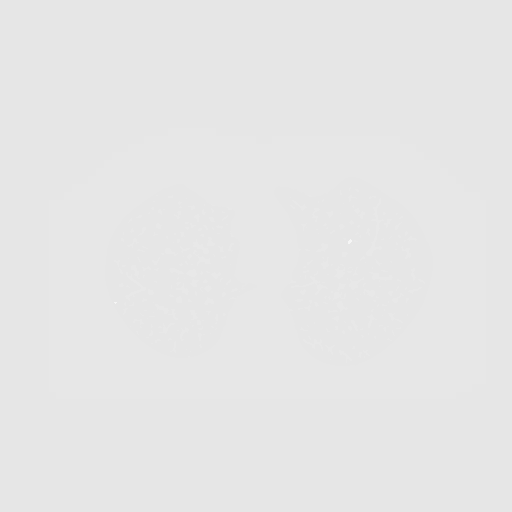
[frame 317/357  mediastinal]
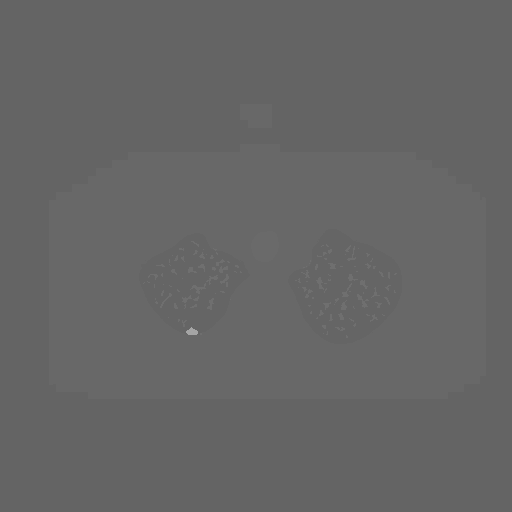
[frame 317/357  lung]
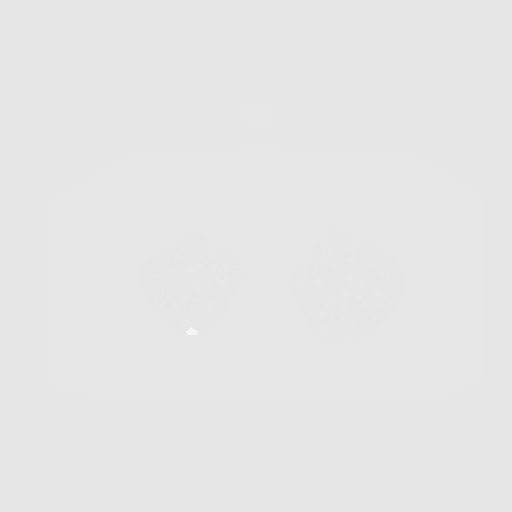
[frame 357/357  lung]
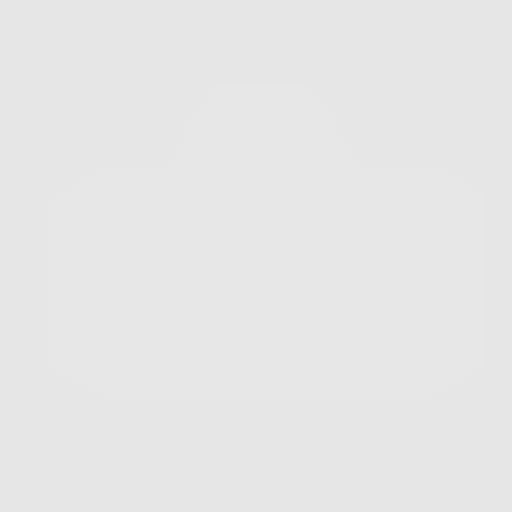

[10 of 10 positions shown; findings below may reference images not displayed]

FINDINGS: Cardiovascular: Atherosclerotic calcification of the aorta and
coronary arteries. Heart size normal. No pericardial effusion.

Mediastinum/Nodes: No pathologically enlarged mediastinal or
axillary lymph nodes. Hilar regions are difficult to definitively
evaluate without IV contrast. Esophagus is grossly unremarkable.

Lungs/Pleura: Centrilobular emphysema. Smoking related respiratory
bronchiolitis. 10.1 mm ground-glass nodule in the left upper lobe.
No additional pulmonary nodules. No pleural fluid. Airway is
unremarkable.

Upper Abdomen: Visualized portions of the liver, gallbladder and
right adrenal gland are unremarkable. Nodular thickening of the left
adrenal gland. 2.3 cm low-attenuation lesion in the interpolar right
kidney and 3.2 cm low-attenuation lesion in the lower pole left
kidney are likely cysts. Other low-attenuation lesions in the
kidneys are too small for definitive characterization. Spleen,
pancreas, stomach and bowel are otherwise unremarkable.

Musculoskeletal: Degenerative changes in the spine. No worrisome
lytic or sclerotic lesions.
IMPRESSION: 1. Lung-RADS 2, benign appearance or behavior. Continue annual
screening with low-dose chest CT without contrast in 12 months.
2. Aortic atherosclerosis (LPD0L-XO7.7). Coronary artery
calcification.
3.  Emphysema (LPD0L-085.Y).

## 2022-07-12 ENCOUNTER — Other Ambulatory Visit: Payer: Self-pay | Admitting: Internal Medicine

## 2022-07-22 DIAGNOSIS — G4733 Obstructive sleep apnea (adult) (pediatric): Secondary | ICD-10-CM | POA: Diagnosis not present

## 2022-07-30 DIAGNOSIS — N281 Cyst of kidney, acquired: Secondary | ICD-10-CM | POA: Diagnosis not present

## 2022-07-30 DIAGNOSIS — G4733 Obstructive sleep apnea (adult) (pediatric): Secondary | ICD-10-CM | POA: Diagnosis not present

## 2022-07-31 DIAGNOSIS — M47816 Spondylosis without myelopathy or radiculopathy, lumbar region: Secondary | ICD-10-CM | POA: Diagnosis not present

## 2022-07-31 DIAGNOSIS — I700 Atherosclerosis of aorta: Secondary | ICD-10-CM | POA: Diagnosis not present

## 2022-07-31 DIAGNOSIS — N281 Cyst of kidney, acquired: Secondary | ICD-10-CM | POA: Diagnosis not present

## 2022-08-04 ENCOUNTER — Ambulatory Visit (INDEPENDENT_AMBULATORY_CARE_PROVIDER_SITE_OTHER): Payer: Medicare Other | Admitting: Internal Medicine

## 2022-08-04 ENCOUNTER — Encounter: Payer: Self-pay | Admitting: Internal Medicine

## 2022-08-04 VITALS — BP 122/78 | HR 70 | Temp 99.2°F | Ht 71.2 in | Wt 252.4 lb

## 2022-08-04 DIAGNOSIS — J432 Centrilobular emphysema: Secondary | ICD-10-CM | POA: Diagnosis not present

## 2022-08-04 DIAGNOSIS — I119 Hypertensive heart disease without heart failure: Secondary | ICD-10-CM

## 2022-08-04 DIAGNOSIS — F17210 Nicotine dependence, cigarettes, uncomplicated: Secondary | ICD-10-CM

## 2022-08-04 DIAGNOSIS — N528 Other male erectile dysfunction: Secondary | ICD-10-CM | POA: Diagnosis not present

## 2022-08-04 DIAGNOSIS — R7309 Other abnormal glucose: Secondary | ICD-10-CM | POA: Diagnosis not present

## 2022-08-04 DIAGNOSIS — E559 Vitamin D deficiency, unspecified: Secondary | ICD-10-CM | POA: Diagnosis not present

## 2022-08-04 DIAGNOSIS — Z6835 Body mass index (BMI) 35.0-35.9, adult: Secondary | ICD-10-CM

## 2022-08-04 DIAGNOSIS — E6609 Other obesity due to excess calories: Secondary | ICD-10-CM

## 2022-08-04 DIAGNOSIS — I2510 Atherosclerotic heart disease of native coronary artery without angina pectoris: Secondary | ICD-10-CM

## 2022-08-04 LAB — BMP8+EGFR
BUN/Creatinine Ratio: 10 (ref 10–24)
BUN: 10 mg/dL (ref 8–27)
CO2: 24 mmol/L (ref 20–29)
Calcium: 9.7 mg/dL (ref 8.6–10.2)
Chloride: 101 mmol/L (ref 96–106)
Creatinine, Ser: 1 mg/dL (ref 0.76–1.27)
Glucose: 115 mg/dL — ABNORMAL HIGH (ref 70–99)
Potassium: 3.5 mmol/L (ref 3.5–5.2)
Sodium: 140 mmol/L (ref 134–144)
eGFR: 81 mL/min/{1.73_m2} (ref >59)

## 2022-08-04 MED ORDER — TADALAFIL 5 MG PO TABS: ORAL_TABLET | ORAL | 0 refills | Status: DC

## 2022-08-04 NOTE — Progress Notes (Unsigned)
Rich Brave [place],acting as a Education administrator for [name], MD.,have documented all relevant documentation on the behalf of [name], MD,as directed by  [name], MD while in the presence of [name], MD.    Subjective:     [name] , male    DOB: 25-Nov-1950 , 72 y.o.   MRN: [id number]   Chief Complaint  Patient presents with   Hypertension    HPI  Patient is here for a hypertension follow up.  He reports compliance with meds. He denies headaches, chest pain and shortness of breath.  He has no specific concerns or complaints at this time. He reports having upcoming cardiac evaluations.   Hypertension This is a chronic problem. The current episode started more than 1 year ago. The problem has been gradually improving since onset. The problem is controlled. Risk factors for coronary artery disease include obesity and male gender. Past treatments include diuretics, calcium channel blockers and angiotensin blockers. The current treatment provides moderate improvement.     Past Medical History:  Diagnosis Date   Diabetes mellitus without complication ([place])    GERD (gastroesophageal reflux disease)    H/O: CVA (cerebrovascular accident) 2003   Patient states the only residual deficit is an occasional stutter.   Hyperlipidemia    Hypertension    OSA (obstructive sleep apnea)    wears CPAP q night     Family History  Problem Relation Age of Onset   Cancer Mother    Stroke Father    Heart disease Father      Current Outpatient Medications:    aspirin 81 MG tablet, Take 81 mg by mouth daily., Disp: , Rfl:    atorvastatin (LIPITOR) 10 MG tablet, TAKE ONE TABLET ONCE DAILY BY MOUTH MONDAY, WEDNESDAY AND FRIDAY WTIH EVENING MEAL, Disp: 36 tablet, Rfl: 2   Cholecalciferol (VITAMIN D) 125 MCG (5000 UT) CAPS, Take 1 capsule by mouth daily., Disp: , Rfl:    EDARBYCLOR 40-25 MG TABS, TAKE ONE TABLET DAILY IN THE MORNING, Disp: 90 tablet, Rfl: 1    Omega-3 Fatty Acids (FISH OIL PO), Take by mouth. 1 daily, Disp: , Rfl:    potassium chloride SA (KLOR-CON M) 20 MEQ tablet, TAKE 1&1/2 tabS by MOUTH daily, Disp: 135 tablet, Rfl: 2   spironolactone (ALDACTONE) 25 MG tablet, TAKE 1 TABLET BY MOUTH IN THE  MORNING, Disp: 100 tablet, Rfl: 2   tadalafil (CIALIS) 5 MG tablet, One tab po qd, Disp: 90 tablet, Rfl: 0   triamcinolone cream (KENALOG) 0.1 %, Apply 1 application topically as needed., Disp: , Rfl:    verapamil (CALAN-SR) 240 MG CR tablet, TAKE 1 TABLET BY MOUTH TWICE  DAILY, Disp: 200 tablet, Rfl: 2   No Known Allergies   Review of Systems  Constitutional: Negative.   Eyes: Negative.   Respiratory: Negative.    Cardiovascular: Negative.   Gastrointestinal: Negative.   Endocrine: Negative for polydipsia, polyphagia and polyuria.  Musculoskeletal: Negative.   Skin: Negative.   Neurological: Negative.   Psychiatric/Behavioral: Negative.       Today's Vitals   08/04/22 0944  BP: 122/78  Pulse: 70  Temp: 99.2 F (37.3 C)  Weight: 252 lb 6.4 oz (114.5 kg)  Height: 5' 11.2" (1.808 m)  PainSc: 0-No pain   Body mass index is 35.01 kg/m.  Wt Readings from Last 3 Encounters:  08/04/22 252 lb 6.4 oz (114.5 kg)  06/10/22 244 lb 12.8 oz (111 kg)  05/12/22 244 lb 8 oz (110.9 kg)     Objective:  Physical Exam Vitals and nursing note reviewed.  Constitutional:      Appearance: Normal appearance.  HENT:     Head: Normocephalic and atraumatic.     Nose:     Comments: Masked     Mouth/Throat:     Comments: Masked  Eyes:     Extraocular Movements: Extraocular movements intact.  Cardiovascular:     Rate and Rhythm: Normal rate and regular rhythm.     Heart sounds: Normal heart sounds.  Pulmonary:     Effort: Pulmonary effort is normal.     Breath sounds: Normal breath sounds.  Musculoskeletal:     Cervical back: Normal range of motion.  Skin:    General: Skin is warm.  Neurological:     General: No focal deficit  present.     Mental Status: He is alert.  Psychiatric:        Mood and Affect: Mood normal.      Assessment And Plan:     1. Hypertensive heart disease without heart failure Comments: Chronic, well controlled. He will c/w Edarbyclor, spironolactone and verapamil. - BMP8+EGFR - CBC no Diff  2. Atherosclerosis of native coronary artery of native heart without angina pectoris Comments: Chronic, also followed by Cardiology. Now on statin therapy.  3. Other male erectile dysfunction Comments: He also has h/o BPH, will send rx tadalafil 48m daily. He is not on any nitrate therapy.  4. Cigarette nicotine dependence without complication Comments: He is not yet ready to quit.  Smoking cessation instruction/counseling given:  counseled patient on the dangers of tobacco use, advised patient to stop smoking, and reviewed strategies to maximize success  - CT CHEST LUNG CA SCREEN LOW DOSE W/O CM; Future  5. Cent[place]ular [place]ysema [place] Comments: Seen on LDCT. He is agreeable to Pulmonary referral for further evaluation. - Ambulatory referral to Pulmonology  6. Vitamin D deficiency, unspecified Comments: I will check vitamin D level and supplement as needed. - Vitamin D (25 hydroxy)  7. Other abnormal glucose Comments: His a1c has been elevated in the past. I will recheck this today. He is encouraged to limit his intake of sugary beverages, including diet drinks. - Hemoglobin A1c  8. Class 2 severe obesity due to excess calories with serious comorbidity and body mass index (BMI) of 35.0 to 35.9 in adult ([facility] Comments: He is encouraged to aim for at least 150 minutes of exercise/week, while striving for BMI<30 to decrease cardiac risk.     Patient was given opportunity to ask questions. Patient verbalized understanding of the plan and was able to repeat key elements of the plan. All questions were answered to their satisfaction.   I, RMaximino Greenland MD, have reviewed all documentation  for this visit. The documentation on 08/04/22 for the exam, diagnosis, procedures, and orders are all accurate and complete.   IF YOU HAVE BEEN REFERRED TO A SPECIALIST, IT MAY TAKE 1-2 WEEKS TO SCHEDULE/PROCESS THE REFERRAL. IF YOU HAVE NOT HEARD FROM US/SPECIALIST IN TWO WEEKS, PLEASE GIVE [place] CALL AT [phone] X [phone].   THE PATIENT IS ENCOURAGED TO PRACTICE SOCIAL DISTANCING DUE TO THE COVID-19 PANDEMIC.

## 2022-08-04 NOTE — Patient Instructions (Signed)
Hypertension, Adult ?Hypertension is another name for high blood pressure. High blood pressure forces your heart to work harder to pump blood. This can cause problems over time. ?There are two numbers in a blood pressure reading. There is a top number (systolic) over a bottom number (diastolic). It is best to have a blood pressure that is below 120/80. ?What are the causes? ?The cause of this condition is not known. Some other conditions can lead to high blood pressure. ?What increases the risk? ?Some lifestyle factors can make you more likely to develop high blood pressure: ?Smoking. ?Not getting enough exercise or physical activity. ?Being overweight. ?Having too much fat, sugar, calories, or salt (sodium) in your diet. ?Drinking too much alcohol. ?Other risk factors include: ?Having any of these conditions: ?Heart disease. ?Diabetes. ?High cholesterol. ?Kidney disease. ?Obstructive sleep apnea. ?Having a family history of high blood pressure and high cholesterol. ?Age. The risk increases with age. ?Stress. ?What are the signs or symptoms? ?High blood pressure may not cause symptoms. Very high blood pressure (hypertensive crisis) may cause: ?Headache. ?Fast or uneven heartbeats (palpitations). ?Shortness of breath. ?Nosebleed. ?Vomiting or feeling like you may vomit (nauseous). ?Changes in how you see. ?Very bad chest pain. ?Feeling dizzy. ?Seizures. ?How is this treated? ?This condition is treated by making healthy lifestyle changes, such as: ?Eating healthy foods. ?Exercising more. ?Drinking less alcohol. ?Your doctor may prescribe medicine if lifestyle changes do not help enough and if: ?Your top number is above 130. ?Your bottom number is above 80. ?Your personal target blood pressure may vary. ?Follow these instructions at home: ?Eating and drinking ? ?If told, follow the DASH eating plan. To follow this plan: ?Fill one half of your plate at each meal with fruits and vegetables. ?Fill one fourth of your plate  at each meal with whole grains. Whole grains include whole-wheat pasta, brown rice, and whole-grain bread. ?Eat or drink low-fat dairy products, such as skim milk or low-fat yogurt. ?Fill one fourth of your plate at each meal with low-fat (lean) proteins. Low-fat proteins include fish, chicken without skin, eggs, beans, and tofu. ?Avoid fatty meat, cured and processed meat, or chicken with skin. ?Avoid pre-made or processed food. ?Limit the amount of salt in your diet to less than 1,500 mg each day. ?Do not drink alcohol if: ?Your doctor tells you not to drink. ?You are pregnant, may be pregnant, or are planning to become pregnant. ?If you drink alcohol: ?Limit how much you have to: ?0-1 drink a day for women. ?0-2 drinks a day for men. ?Know how much alcohol is in your drink. In the [place] one drink equals one 12 oz bottle of beer (355 mL), one 5 oz glass of wine (148 mL), or one 1? oz glass of hard liquor (44 mL). ?Lifestyle ? ?Work with your doctor to stay at a healthy weight or to lose weight. Ask your doctor what the best weight is for you. ?Get at least 30 minutes of exercise that causes your heart to beat faster (aerobic exercise) most days of the week. This may include walking, swimming, or biking. ?Get at least 30 minutes of exercise that strengthens your muscles (resistance exercise) at least 3 days a week. This may include lifting weights or doing Pilates. ?Do not smoke or use any products that contain nicotine or tobacco. If you need help quitting, ask your doctor. ?Check your blood pressure at home as told by your doctor. ?Keep all follow-up visits. ?Medicines ?Take over-the-counter and prescription medicines   only as told by your doctor. Follow directions carefully. ?Do not skip doses of blood pressure medicine. The medicine does not work as well if you skip doses. Skipping doses also puts you at risk for problems. ?Ask your doctor about side effects or reactions to medicines that you should watch  for. ?Contact a doctor if: ?You think you are having a reaction to the medicine you are taking. ?You have headaches that keep coming back. ?You feel dizzy. ?You have swelling in your ankles. ?You have trouble with your vision. ?Get help right away if: ?You get a very bad headache. ?You start to feel mixed up (confused). ?You feel weak or numb. ?You feel faint. ?You have very bad pain in your: ?Chest. ?Belly (abdomen). ?You vomit more than once. ?You have trouble breathing. ?These symptoms may be an emergency. Get help right away. Call 911. ?Do not wait to see if the symptoms will go away. ?Do not drive yourself to the hospital. ?Summary ?Hypertension is another name for high blood pressure. ?High blood pressure forces your heart to work harder to pump blood. ?For most people, a normal blood pressure is less than 120/80. ?Making healthy choices can help lower blood pressure. If your blood pressure does not get lower with healthy choices, you may need to take medicine. ?This information is not intended to replace advice given to you by your health care provider. Make sure you discuss any questions you have with your health care provider. ?Document Revised: 05/08/2021 Document Reviewed: 05/08/2021 ?Elsevier Patient Education ? 2023 Elsevier Inc. ? ?

## 2022-08-05 ENCOUNTER — Encounter: Payer: Self-pay | Admitting: Internal Medicine

## 2022-08-05 LAB — CBC
Hematocrit: 39.7 % (ref 37.5–51.0)
Hemoglobin: 13.8 g/dL (ref 13.0–17.7)
MCH: 31.6 pg (ref 26.6–33.0)
MCHC: 34.8 g/dL (ref 31.5–35.7)
MCV: 91 fL (ref 79–97)
Platelets: 311 10*3/uL (ref 150–450)
RBC: 4.37 x10E6/uL (ref 4.14–5.80)
RDW: 12.7 % (ref 11.6–15.4)
WBC: 6.5 10*3/uL (ref 3.4–10.8)

## 2022-08-05 LAB — VITAMIN D 25 HYDROXY (VIT D DEFICIENCY, FRACTURES): Vit D, 25-Hydroxy: 68.1 ng/mL (ref 30.0–100.0)

## 2022-08-05 LAB — HEMOGLOBIN A1C
Est. average glucose Bld gHb Est-mCnc: 128 mg/dL
Hgb A1c MFr Bld: 6.1 % — ABNORMAL HIGH (ref 4.8–5.6)

## 2022-08-06 DIAGNOSIS — N281 Cyst of kidney, acquired: Secondary | ICD-10-CM | POA: Diagnosis not present

## 2022-08-09 NOTE — H&P (View-Only) (Signed)
Primary Physician/Referring:  Glendale Chard, MD  [name], male    DOB: [birth year], 72 y.o.   MRN: [id number]   Chief Complaint  Patient presents with   Shortness of Breath   Anormal stress test   Hypertension   Follow-up    2 months    HPI:    Terry Andrews  is a 72 y.o. African-American male with hypertension, history of remote stroke in 2003 with no residual defects probably related to hypertension, tobacco use disorder, obstructive sleep apnea on CPAP.    Except for mild chronic dyspnea that he had not stated much before, states that he is doing well.  Still able to do most of his physical activity without any significant limitations.  No PND or orthopnea or leg edema.  Due to abnormal nuclear stress test and also echocardiogram, he was recommended cardiac catheterization.  He presents here to discuss further.  Past Medical History:  Diagnosis Date   Diabetes mellitus without complication ([place])    GERD (gastroesophageal reflux disease)    H/O: CVA (cerebrovascular accident) 2003   Patient states the only residual deficit is an occasional stutter.   Hyperlipidemia    Hypertension    OSA (obstructive sleep apnea)    wears CPAP q night   Past Surgical History:  Procedure Laterality Date   ANAL FISTULOTOMY N/A 05/29/2021   Procedure: WIDE EXCISION OF HYDRADINITIS;  Surgeon: Leighton Ruff, MD;  Location: [facility];  Service: General;  Laterality: N/A;   COLONOSCOPY  12/21/2019   Family History  Problem Relation Age of Onset   Cancer Mother    Stroke Father    Heart disease Father     Social History   Tobacco Use   Smoking status: Every Day    Packs/day: 0.25    Years: 52.00    Total pack years: 13.00    Types: Cigarettes    Start date: 1965   Smokeless tobacco: Never   Tobacco comments:    he has cut back number cigs/smoked,   Substance Use Topics   Alcohol use: Yes    Alcohol/week: 0.0 standard drinks of alcohol     Comment: Occasional    Marital Status: Single  ROS  Review of Systems  Cardiovascular:  Positive for dyspnea on exertion. Negative for chest pain and leg swelling.  Gastrointestinal:  Negative for melena.   Objective  Blood pressure 129/67, pulse 90, resp. rate 16, height '5\' 11"'$  (1.803 m), weight 249 lb (112.9 kg), SpO2 97 %.      08/10/2022    9:32 AM 08/04/2022    9:44 AM 06/10/2022    2:51 PM  Vitals with BMI  Height '5\' 11"'$  5' 11.2" '6\' 3"'$   Weight 249 lbs 252 lbs 6 oz 244 lbs 13 oz  BMI 34.74 37.90 24.0  Systolic 973 532 992  Diastolic 67 78 68  Pulse 90 70 75     Physical Exam HENT:     Head: Atraumatic.  Neck:     Vascular: No carotid bruit or JVD.  Cardiovascular:     Rate and Rhythm: Normal rate and regular rhythm.     Pulses: Intact distal pulses.     Heart sounds: S1 normal and S2 normal. Murmur heard.     Systolic murmur is present with a grade of 2/6 at the upper right sternal border.     No gallop.  Pulmonary:     Effort: Pulmonary effort is normal.  Breath sounds: Normal breath sounds.  Abdominal:     General: Bowel sounds are normal.     Palpations: Abdomen is soft.  Musculoskeletal:     Right lower leg: No edema.     Left lower leg: No edema.    Laboratory examination:   Lab Results  Component Value Date   NA 140 08/04/2022   K 3.5 08/04/2022   CO2 24 08/04/2022   GLUCOSE 115 (H) 08/04/2022   BUN 10 08/04/2022   CREATININE 1.00 08/04/2022   CALCIUM 9.7 08/04/2022   EGFR 81 08/04/2022   GFRNONAA 84 09/23/2020      Latest Ref Rng & Units 08/04/2022   10:21 AM 03/30/2022   10:23 AM 12/23/2021    3:47 PM  CMP  Glucose 70 - 99 mg/dL 115  114    BUN 8 - 27 mg/dL 10  11    Creatinine 0.76 - 1.27 mg/dL 1.00  0.97    Sodium 134 - 144 mmol/L 140  140    Potassium 3.5 - 5.2 mmol/L 3.5  3.6    Chloride 96 - 106 mmol/L 101  101    CO2 20 - 29 mmol/L 24  25    Calcium 8.6 - 10.2 mg/dL 9.7  10.1    Total Protein 6.0 - 8.5 g/dL  7.1    Total  Bilirubin 0.0 - 1.2 mg/dL  0.3    Alkaline Phos 44 - 121 IU/L  56    AST 0 - 40 IU/L  19    ALT 0 - 44 IU/L  18  23       Latest Ref Rng & Units 08/04/2022   10:16 AM 05/29/2021    6:04 AM 03/24/2021    9:54 AM  CBC  WBC 3.4 - 10.8 x10E3/uL 6.5   6.6   Hemoglobin 13.0 - 17.7 g/dL 13.8  14.6  14.9   Hematocrit 37.5 - 51.0 % 39.7  43.0  43.9   Platelets 150 - 450 x10E3/uL 311   274     Lipid Panel Recent Labs    09/15/21 1008 12/23/21 1547  CHOL 117 105  TRIG 56 43  LDLCALC 54 42  HDL 50 52  CHOLHDL 2.3 2.0    HEMOGLOBIN A1C Lab Results  Component Value Date   HGBA1C 6.1 (H) 08/04/2022   TSH No results for input(s): "TSH" in the last 8760 hours.  Medications and allergies  No Known Allergies   Current Outpatient Medications:    aspirin 81 MG tablet, Take 81 mg by mouth daily., Disp: , Rfl:    atorvastatin (LIPITOR) 10 MG tablet, TAKE ONE TABLET ONCE DAILY BY MOUTH MONDAY, WEDNESDAY AND FRIDAY WTIH EVENING MEAL, Disp: 36 tablet, Rfl: 2   Cholecalciferol (VITAMIN D) 125 MCG (5000 UT) CAPS, Take 1 capsule by mouth daily., Disp: , Rfl:    EDARBYCLOR 40-25 MG TABS, TAKE ONE TABLET DAILY IN THE MORNING, Disp: 90 tablet, Rfl: 1   Omega-3 Fatty Acids (FISH OIL PO), Take by mouth. 1 daily, Disp: , Rfl:    potassium chloride SA (KLOR-CON M) 20 MEQ tablet, TAKE 1&1/2 tabS by MOUTH daily, Disp: 135 tablet, Rfl: 2   spironolactone (ALDACTONE) 25 MG tablet, TAKE 1 TABLET BY MOUTH IN THE  MORNING, Disp: 100 tablet, Rfl: 2   tadalafil (CIALIS) 5 MG tablet, One tab po qd, Disp: 90 tablet, Rfl: 0   triamcinolone cream (KENALOG) 0.1 %, Apply 1 application topically as needed., Disp: , Rfl:  verapamil (CALAN-SR) 240 MG CR tablet, TAKE 1 TABLET BY MOUTH TWICE  DAILY, Disp: 200 tablet, Rfl: 2  Radiology:   No results found.  Cardiac Studies:   Abdominal Aortic Duplex  06/07/2019:  Normal abdominal aorta. No evidence of aneurysm.  Incidental 1.3x1.6 heterogeneous mass right kidney  apex noted, consider  dedicated US of the kidneys.  See attached image. (will be ordered)  PCV MYOCARDIAL PERFUSION WO LEXISCAN 05/12/2022  Narrative Exercise nuclear stress test 05/12/2022: There is a fixed severe defect in the anterior, inferior, septal and apical regions. LV is mildly dilated in both rest and stress. Overall LV systolic function is abnormal without regional wall motion abnormalities. Mild global hypokinesis of the left ventricle. Stress LV EF: 26%. Normal ECG stress. The patient exercised for 4 minutes and 57 seconds of a Bruce protocol, achieving approximately 6.94 METs. and achieved 88% of MPHR. The blood pressure response was normal. High risk study.  NO prior studies for comparison.  PCV ECHOCARDIOGRAM COMPLETE 04/23/2022  Narrative Echocardiogram 04/23/2022: Mildly depressed LV systolic function with visual EF 45-50%. Left ventricle cavity is normal in size. Normal left ventricular wall thickness. Doppler evidence of grade I (impaired) diastolic dysfunction, normal LAP. Left ventricle regional wall motion findings: Basal inferior, Mid inferior, Mid inferoseptal, Apical anterior, Apical inferior, Apical septal and Apical cap hypokinesis. No significant valvular heart disease. Compared to 04/15/2017 LVEF reduced from 50-55% to 45-50%, RWMA are new, otherwise no significant change.     EKG  EKG  08/10/2022:  Normal sinus rhythm at the rate of 81 bpm,  IVCD,  Incomplete left bundle branch block.  Borderline criteria for LVH.  Anteroseptal infarct old.  Nonspecific T abnormality.  Compared to 825  2023,  No significant change.  LAE,  Assessment     ICD-10-CM   1. Abnormal nuclear stress test  R94.39 EKG 12-Lead    CANCELED: CBC    CANCELED: Basic metabolic panel    2. Dyspnea on exertion  R06.09     3. Hypertensive heart disease without heart failure  I11.9     4. Tobacco use  Z72.0        There are no discontinued medications.   No orders of the defined  types were placed in this encounter.  Recommendations:   Terry Andrews is a 72 y.o. African-American male with hypertension, history of remote stroke in 2003 with no residual defects probably related to hypertension, tobacco use disorder, obstructive sleep apnea on CPAP, hypertension, hypercholesterolemia, tobacco use disorder presents here for 56-monthoffice visit.  1. Abnormal nuclear stress test Abnormal nuclear stress test, high risk in the anterior and inferoseptal regions, also relative correlation with echocardiographic findings as well.  In view of hypertension, hyperlipidemia, diabetes mellitus/prediabetes.  This, ongoing tobacco use, he would certainly be a high risk individual and has not had any cardiac work-up in many years.  We discussed regarding cardiac catheterization.  On his last office visit 3 months ago, because of his wife's health status, he wanted to wait but now he is willing to go through with cardiac catheterization. He understands the risks and benefits and wants to proceed with this.  I did not make any changes to his medication however would like to see him back sometime in January 2024 prior to the procedure.  2. Dyspnea on exertion His symptoms of dyspnea on exertion could be anginal equivalent.  This been stable, class II symptoms only.  No PND or orthopnea.  No clinical evidence heart  failure.  3. Hypertensive heart disease without heart failure Since being on medical therapy, he has not had any exacerbation of heart failure, tolerating all his medications well, blood pressure is under excellent control.  4.  Tobacco use disorder. Educated smoking cessation with the patient.  Office visit after the cardiac catheterization.  External labs reviewed, he has recently had CBC and BMP, no further labs are needed for cardiac catheterization.  I will see him back after the cardiac catheterization.  All questions answered.    Adrian Prows, MD, [facility] 08/10/2022, 10:08  AM Office: [phone]

## 2022-08-09 NOTE — Progress Notes (Signed)
Primary Physician/Referring:  Dorothyann Peng, MD  [name], male    DOB: [birth year], 72 y.o.   MRN: [id number]   No chief complaint on file.   HPI:    Terry Andrews  is a 71 y.o. African-American male with hypertension, history of remote stroke in 2003 with no residual defects probably related to hypertension, tobacco use disorder, obstructive sleep apnea on CPAP, seen 2 months ago, and echocardiogram was ordered.  This revealed wall motion abnormality and mildly reduced LVEF.  Hence underwent nuclear stress testing which is abnormal, he now presents for follow-up.  Except for mild chronic dyspnea that he had not stated much before, states that he is doing well.  Still able to do most of his physical activity without any significant limitations.  No PND or orthopnea or leg edema.  Past Medical History:  Diagnosis Date   Diabetes mellitus without complication ([facility])    GERD (gastroesophageal reflux disease)    H/O: CVA (cerebrovascular accident) 2003   Patient states the only residual deficit is an occasional stutter.   Hyperlipidemia    Hypertension    OSA (obstructive sleep apnea)    wears CPAP q night   Past Surgical History:  Procedure Laterality Date   ANAL FISTULOTOMY N/A 05/29/2021   Procedure: WIDE EXCISION OF HYDRADINITIS;  Surgeon: Romie Levee, MD;  Location: [facility];  Service: General;  Laterality: N/A;   COLONOSCOPY  12/21/2019   Family History  Problem Relation Age of Onset   Cancer Mother    Stroke Father    Heart disease Father     Social History   Tobacco Use   Smoking status: Every Day    Packs/day: 0.25    Years: 52.00    Total pack years: 13.00    Types: Cigarettes    Start date: 1965   Smokeless tobacco: Never   Tobacco comments:    he has cut back number cigs/smoked,   Substance Use Topics   Alcohol use: Yes    Alcohol/week: 0.0 standard drinks of alcohol    Comment: Occasional    Marital Status: Single   ROS  Review of Systems  Cardiovascular:  Positive for dyspnea on exertion. Negative for chest pain and leg swelling.  Gastrointestinal:  Negative for melena.   Objective  There were no vitals taken for this visit.      08/04/2022    9:44 AM 06/10/2022    2:51 PM 05/12/2022   12:54 PM  Vitals with BMI  Height 5' 11.2" 6\' 3"  6\' 3"   Weight 252 lbs 6 oz 244 lbs 13 oz 244 lbs 8 oz  BMI 35.02 30.6 30.56  Systolic 122 117 409  Diastolic 78 68 71  Pulse 70 75 77     Physical Exam HENT:     Head: Atraumatic.  Neck:     Vascular: No carotid bruit or JVD.  Cardiovascular:     Rate and Rhythm: Normal rate and regular rhythm.     Pulses: Intact distal pulses.     Heart sounds: S1 normal and S2 normal. Murmur heard.     Systolic murmur is present with a grade of 2/6 at the upper right sternal border.     No gallop.  Pulmonary:     Effort: Pulmonary effort is normal.     Breath sounds: Normal breath sounds.  Abdominal:     General: Bowel sounds are normal.     Palpations: Abdomen is soft.  Musculoskeletal:     Right lower leg: No edema.     Left lower leg: No edema.    Laboratory examination:   Lab Results  Component Value Date   NA 140 08/04/2022   K 3.5 08/04/2022   CO2 24 08/04/2022   GLUCOSE 115 (H) 08/04/2022   BUN 10 08/04/2022   CREATININE 1.00 08/04/2022   CALCIUM 9.7 08/04/2022   EGFR 81 08/04/2022   GFRNONAA 84 09/23/2020   estimated creatinine clearance is 90 mL/min (by C-G formula based on SCr of 1 mg/dL).     Latest Ref Rng & Units 08/04/2022   10:21 AM 03/30/2022   10:23 AM 12/23/2021    3:47 PM  CMP  Glucose 70 - 99 mg/dL 161  096    BUN 8 - 27 mg/dL 10  11    Creatinine 0.45 - 1.27 mg/dL 4.09  8.11    Sodium 914 - 144 mmol/L 140  140    Potassium 3.5 - 5.2 mmol/L 3.5  3.6    Chloride 96 - 106 mmol/L 101  101    CO2 20 - 29 mmol/L 24  25    Calcium 8.6 - 10.2 mg/dL 9.7  78.2    Total Protein 6.0 - 8.5 g/dL  7.1    Total Bilirubin 0.0 - 1.2 mg/dL   0.3    Alkaline Phos 44 - 121 IU/L  56    AST 0 - 40 IU/L  19    ALT 0 - 44 IU/L  18  23       Latest Ref Rng & Units 08/04/2022   10:16 AM 05/29/2021    6:04 AM 03/24/2021    9:54 AM  CBC  WBC 3.4 - 10.8 x10E3/uL 6.5   6.6   Hemoglobin 13.0 - 17.7 g/dL 95.6  21.3  08.6   Hematocrit 37.5 - 51.0 % 39.7  43.0  43.9   Platelets 150 - 450 x10E3/uL 311   274     Lipid Panel Recent Labs    09/15/21 1008 12/23/21 1547  CHOL 117 105  TRIG 56 43  LDLCALC 54 42  HDL 50 52  CHOLHDL 2.3 2.0    HEMOGLOBIN V7Q Lab Results  Component Value Date   HGBA1C 6.1 (H) 08/04/2022   TSH No results for input(s): "TSH" in the last 8760 hours.  Medications and allergies  No Known Allergies   Current Outpatient Medications:    aspirin 81 MG tablet, Take 81 mg by mouth daily., Disp: , Rfl:    atorvastatin (LIPITOR) 10 MG tablet, TAKE ONE TABLET ONCE DAILY BY MOUTH MONDAY, WEDNESDAY AND FRIDAY WTIH EVENING MEAL, Disp: 36 tablet, Rfl: 2   Cholecalciferol (VITAMIN D) 125 MCG (5000 UT) CAPS, Take 1 capsule by mouth daily., Disp: , Rfl:    EDARBYCLOR 40-25 MG TABS, TAKE ONE TABLET DAILY IN THE MORNING, Disp: 90 tablet, Rfl: 1   Omega-3 Fatty Acids (FISH OIL PO), Take by mouth. 1 daily, Disp: , Rfl:    potassium chloride SA (KLOR-CON M) 20 MEQ tablet, TAKE 1&1/2 tabS by MOUTH daily, Disp: 135 tablet, Rfl: 2   spironolactone (ALDACTONE) 25 MG tablet, TAKE 1 TABLET BY MOUTH IN THE  MORNING, Disp: 100 tablet, Rfl: 2   tadalafil (CIALIS) 5 MG tablet, One tab po qd, Disp: 90 tablet, Rfl: 0   triamcinolone cream (KENALOG) 0.1 %, Apply 1 application topically as needed., Disp: , Rfl:    verapamil (CALAN-SR) 240 MG CR tablet, TAKE 1  TABLET BY MOUTH TWICE  DAILY, Disp: 200 tablet, Rfl: 2  Radiology:   No results found.  Cardiac Studies:   Abdominal Aortic Duplex  06/07/2019:  Normal abdominal aorta. No evidence of aneurysm.  Incidental 1.3x1.6 heterogeneous mass right kidney apex noted, consider   dedicated US of the kidneys.  See attached image. (will be ordered)  PCV MYOCARDIAL PERFUSION WO LEXISCAN 05/12/2022  Narrative Exercise nuclear stress test 05/12/2022: There is a fixed severe defect in the anterior, inferior, septal and apical regions. LV is mildly dilated in both rest and stress. Overall LV systolic function is abnormal without regional wall motion abnormalities. Mild global hypokinesis of the left ventricle. Stress LV EF: 26%. Normal ECG stress. The patient exercised for 4 minutes and 57 seconds of a Bruce protocol, achieving approximately 6.94 METs. and achieved 88% of MPHR. The blood pressure response was normal. High risk study.  NO prior studies for comparison.  PCV ECHOCARDIOGRAM COMPLETE 04/23/2022  Narrative Echocardiogram 04/23/2022: Mildly depressed LV systolic function with visual EF 45-50%. Left ventricle cavity is normal in size. Normal left ventricular wall thickness. Doppler evidence of grade I (impaired) diastolic dysfunction, normal LAP. Left ventricle regional wall motion findings: Basal inferior, Mid inferior, Mid inferoseptal, Apical anterior, Apical inferior, Apical septal and Apical cap hypokinesis. No significant valvular heart disease. Compared to 04/15/2017 LVEF reduced from 50-55% to 45-50%, RWMA are new, otherwise no significant change.     EKG  EKG 03/27/2022: Sinus rhythm with first AV block 75 bpm, normal axis, LVH.  1 pair of Ventricular couplets, multifocal.  Nonspecific T abnormality.  No significant change from 03/26/2021, PVCs  Assessment     ICD-10-CM   1. Abnormal nuclear stress test  R94.39     2. Dyspnea on exertion  R06.09     3. Hypertensive heart disease without heart failure  I11.9     4. Pure hypercholesterolemia  E78.00     5. Tobacco use  Z72.0        There are no discontinued medications.   No orders of the defined types were placed in this encounter.  Recommendations:   Terry Andrews is a 72 y.o.  African-American male with hypertension, history of remote stroke in 2003 with no residual defects probably related to hypertension, tobacco use disorder, obstructive sleep apnea on CPAP, hypertension, hypercholesterolemia, tobacco use disorder presents here for 57-month office visit.  1. Abnormal nuclear stress test Abnormal nuclear stress test, high risk in the anterior and inferoseptal regions, also relative correlation with echocardiographic findings as well.  In view of hypertension, hyperlipidemia, diabetes mellitus/prediabetes.  This, ongoing tobacco use, he would certainly be a high risk individual and has not had any cardiac work-up in many years.  We discussed regarding cardiac catheterization.  Patient is taking care of his fiance that has recently had intracerebral bleed, he would like to wait for 2 months prior to proceeding with cardiac catheterization and he understands the risks and benefits and wants to proceed with this.  I did not make any changes to his medication however would like to see him back sometime in January 2024 prior to the procedure.  2. Dyspnea on exertion His symptoms of dyspnea on exertion could be anginal equivalent.  This been stable and nothing alarming, class II symptoms only.  No PND or orthopnea.  No clinical evidence heart failure.  3. Hypertensive heart disease without heart failure Since being on medical therapy, he has not had any exacerbation of heart failure, tolerating all  his medications well, blood pressure is under excellent control.  4. Pure hypercholesterolemia I reviewed his lipids, again excellent control.  Overall all his risk factors are well controlled except tobacco use disorder that is ongoing.  I discussed this again with the patient.  5. Tobacco use disorder. ***    Yates Decamp, MD, [facility] 08/09/2022, 3:40 PM Office: [phone]

## 2022-08-10 ENCOUNTER — Ambulatory Visit: Payer: Medicare Other | Admitting: Cardiology

## 2022-08-10 ENCOUNTER — Encounter: Payer: Self-pay | Admitting: Cardiology

## 2022-08-10 VITALS — BP 129/67 | HR 90 | Resp 16 | Ht 71.0 in | Wt 249.0 lb

## 2022-08-10 DIAGNOSIS — Z720 Tobacco use: Secondary | ICD-10-CM | POA: Diagnosis not present

## 2022-08-10 DIAGNOSIS — I119 Hypertensive heart disease without heart failure: Secondary | ICD-10-CM

## 2022-08-10 DIAGNOSIS — R9439 Abnormal result of other cardiovascular function study: Secondary | ICD-10-CM | POA: Diagnosis not present

## 2022-08-10 DIAGNOSIS — R0609 Other forms of dyspnea: Secondary | ICD-10-CM

## 2022-08-10 DIAGNOSIS — E7800 Pure hypercholesterolemia, unspecified: Secondary | ICD-10-CM

## 2022-08-14 ENCOUNTER — Ambulatory Visit (HOSPITAL_COMMUNITY)
Admission: RE | Admit: 2022-08-14 | Discharge: 2022-08-14 | Disposition: A | Discharge status: Home / Self Care | Payer: Medicare Other | Attending: Cardiology | Admitting: Cardiology

## 2022-08-14 ENCOUNTER — Other Ambulatory Visit: Payer: Self-pay

## 2022-08-14 ENCOUNTER — Encounter (HOSPITAL_COMMUNITY)
Admission: RE | Disposition: A | Discharge status: Home / Self Care | Payer: Self-pay | Source: Home / Self Care | Attending: Cardiology

## 2022-08-14 DIAGNOSIS — E119 Type 2 diabetes mellitus without complications: Secondary | ICD-10-CM | POA: Insufficient documentation

## 2022-08-14 DIAGNOSIS — F17210 Nicotine dependence, cigarettes, uncomplicated: Secondary | ICD-10-CM | POA: Insufficient documentation

## 2022-08-14 DIAGNOSIS — R9439 Abnormal result of other cardiovascular function study: Secondary | ICD-10-CM | POA: Diagnosis not present

## 2022-08-14 DIAGNOSIS — Z8673 Personal history of transient ischemic attack (TIA), and cerebral infarction without residual deficits: Secondary | ICD-10-CM | POA: Diagnosis not present

## 2022-08-14 DIAGNOSIS — R0609 Other forms of dyspnea: Secondary | ICD-10-CM | POA: Insufficient documentation

## 2022-08-14 DIAGNOSIS — E785 Hyperlipidemia, unspecified: Secondary | ICD-10-CM | POA: Diagnosis not present

## 2022-08-14 DIAGNOSIS — I110 Hypertensive heart disease with heart failure: Secondary | ICD-10-CM

## 2022-08-14 DIAGNOSIS — I119 Hypertensive heart disease without heart failure: Secondary | ICD-10-CM | POA: Diagnosis not present

## 2022-08-14 DIAGNOSIS — G4733 Obstructive sleep apnea (adult) (pediatric): Secondary | ICD-10-CM | POA: Diagnosis not present

## 2022-08-14 DIAGNOSIS — I10 Essential (primary) hypertension: Secondary | ICD-10-CM | POA: Diagnosis not present

## 2022-08-14 HISTORY — PX: LEFT HEART CATH AND CORONARY ANGIOGRAPHY: CATH118249

## 2022-08-14 SURGERY — LEFT HEART CATH AND CORONARY ANGIOGRAPHY
Anesthesia: LOCAL

## 2022-08-14 MED ORDER — SODIUM CHLORIDE 0.9 % IV SOLN: 250.0000 mL | INTRAVENOUS | Status: DC | PRN

## 2022-08-14 MED ORDER — ASPIRIN 81 MG PO CHEW: 81.0000 mg | CHEWABLE_TABLET | ORAL | Status: DC

## 2022-08-14 MED ORDER — MIDAZOLAM HCL 2 MG/2ML IJ SOLN
INTRAMUSCULAR | Status: DC | PRN
  Administered 2022-08-14: 2 mg via INTRAVENOUS

## 2022-08-14 MED ORDER — VERAPAMIL HCL 2.5 MG/ML IV SOLN
INTRAVENOUS | Status: DC | PRN
  Administered 2022-08-14: 4 mL via INTRA_ARTERIAL

## 2022-08-14 MED ORDER — SODIUM CHLORIDE 0.9% FLUSH: 3.0000 mL | Freq: Two times a day (BID) | INTRAVENOUS | Status: DC

## 2022-08-14 MED ORDER — FENTANYL CITRATE (PF) 100 MCG/2ML IJ SOLN
INTRAMUSCULAR | Status: AC
  Filled 2022-08-14: qty 2

## 2022-08-14 MED ORDER — FENTANYL CITRATE (PF) 100 MCG/2ML IJ SOLN
INTRAMUSCULAR | Status: DC | PRN
  Administered 2022-08-14: 25 ug via INTRAVENOUS

## 2022-08-14 MED ORDER — ONDANSETRON HCL 4 MG/2ML IJ SOLN: 4.0000 mg | Freq: Four times a day (QID) | INTRAMUSCULAR | Status: DC | PRN

## 2022-08-14 MED ORDER — LIDOCAINE HCL (PF) 1 % IJ SOLN
INTRAMUSCULAR | Status: DC | PRN
  Administered 2022-08-14: 2 mL via INTRADERMAL

## 2022-08-14 MED ORDER — VERAPAMIL HCL 2.5 MG/ML IV SOLN
INTRAVENOUS | Status: AC
  Filled 2022-08-14: qty 2

## 2022-08-14 MED ORDER — IOHEXOL 350 MG/ML SOLN
INTRAVENOUS | Status: DC | PRN
  Administered 2022-08-14: 30 mL

## 2022-08-14 MED ORDER — SODIUM CHLORIDE 0.9% FLUSH: 3.0000 mL | INTRAVENOUS | Status: DC | PRN

## 2022-08-14 MED ORDER — HEPARIN (PORCINE) IN NACL 1000-0.9 UT/500ML-% IV SOLN
INTRAVENOUS | Status: AC
  Filled 2022-08-14: qty 1000

## 2022-08-14 MED ORDER — HEPARIN SODIUM (PORCINE) 1000 UNIT/ML IJ SOLN
INTRAMUSCULAR | Status: AC
  Filled 2022-08-14: qty 10

## 2022-08-14 MED ORDER — LIDOCAINE HCL (PF) 1 % IJ SOLN
INTRAMUSCULAR | Status: AC
  Filled 2022-08-14: qty 30

## 2022-08-14 MED ORDER — MIDAZOLAM HCL 2 MG/2ML IJ SOLN
INTRAMUSCULAR | Status: AC
  Filled 2022-08-14: qty 2

## 2022-08-14 MED ORDER — SODIUM CHLORIDE 0.9 % WEIGHT BASED INFUSION
3.0000 mL/kg/h | INTRAVENOUS | Status: DC
  Administered 2022-08-14: 3 mL/kg/h via INTRAVENOUS

## 2022-08-14 MED ORDER — SODIUM CHLORIDE 0.9 % WEIGHT BASED INFUSION: 1.0000 mL/kg/h | INTRAVENOUS | Status: DC

## 2022-08-14 MED ORDER — HEPARIN SODIUM (PORCINE) 1000 UNIT/ML IJ SOLN
INTRAMUSCULAR | Status: DC | PRN
  Administered 2022-08-14: 5000 [IU] via INTRAVENOUS

## 2022-08-14 MED ORDER — HEPARIN (PORCINE) IN NACL 1000-0.9 UT/500ML-% IV SOLN
INTRAVENOUS | Status: DC | PRN
  Administered 2022-08-14 (×2): 500 mL

## 2022-08-14 MED ORDER — ACETAMINOPHEN 325 MG PO TABS: 650.0000 mg | ORAL_TABLET | ORAL | Status: DC | PRN

## 2022-08-14 SURGICAL SUPPLY — 9 items
CATH INFINITI 5FR JK (CATHETERS) IMPLANT
DEVICE RAD COMP TR BAND LRG (VASCULAR PRODUCTS) IMPLANT
GLIDESHEATH SLEND A-KIT 6F 22G (SHEATH) IMPLANT
GUIDEWIRE INQWIRE 1.5J.035X260 (WIRE) IMPLANT
INQWIRE 1.5J .035X260CM (WIRE) ×1
KIT HEART LEFT (KITS) ×2 IMPLANT
PACK CARDIAC CATHETERIZATION (CUSTOM PROCEDURE TRAY) ×2 IMPLANT
TRANSDUCER W/STOPCOCK (MISCELLANEOUS) ×2 IMPLANT
TUBING CIL FLEX 10 FLL-RA (TUBING) ×2 IMPLANT

## 2022-08-14 NOTE — Interval H&P Note (Signed)
History and Physical Interval Note:  08/14/2022 10:52 AM  Terry Andrews  has presented today for surgery, with the diagnosis of abnormal stress tesst - shortness of breath.  The various methods of treatment have been discussed with the patient and family. After consideration of risks, benefits and other options for treatment, the patient has consented to  Procedure(s): LEFT HEART CATH AND CORONARY ANGIOGRAPHY (N/A) and possible angioplasty as a surgical intervention.  The patient's history has been reviewed, patient examined, no change in status, stable for surgery.  I have reviewed the patient's chart and labs.  Questions were answered to the patient's satisfaction.   Cath Lab Visit (complete for each Cath Lab visit)  Clinical Evaluation Leading to the Procedure:   ACS: No.  Non-ACS:    Anginal Classification: CCS II  Anti-ischemic medical therapy: Minimal Therapy (1 class of medications)  Non-Invasive Test Results: High-risk stress test findings: cardiac mortality >3%/year  Prior CABG: No previous CABG   Adrian Prows

## 2022-08-14 NOTE — Discharge Instructions (Signed)
DRINK PLENTY OF FLUIDS FOR NEXT 24 HOURS.

## 2022-08-14 NOTE — Progress Notes (Signed)
Cath Lab nurse reported that Iv came out and that Dr. Einar Gip was aware and okay . Encouraged patient to hydrate and to drink plenty fluids x 24 hrs.

## 2022-08-17 ENCOUNTER — Telehealth: Payer: Self-pay

## 2022-08-17 ENCOUNTER — Encounter (HOSPITAL_COMMUNITY): Payer: Self-pay | Admitting: Cardiology

## 2022-08-17 NOTE — Telephone Encounter (Signed)
I attempted to call pt unable to leave vm. His insurance wont cover the tadalafil '5mg'$  but he can try sildenafil '20mg'$  [facility] has it. It is $30 for 30 pills if he is interested. YL,RMA

## 2022-08-25 NOTE — Progress Notes (Deleted)
Synopsis: Referred for emphysema by Glendale Chard, MD  Subjective:   [name] GENDER: male DOB: 01/31/1951, MRN: [id number]  No chief complaint on file.  71yF with history of smoking, emphysema, OSA on CPAP, CVA, GERD, HTN  Underwent LHC 08/10/22 for abnormal stress, reportedly normal  Otherwise pertinent review of systems is negative.  Past Medical History:  Diagnosis Date   Diabetes mellitus without complication ([place])    GERD (gastroesophageal reflux disease)    H/O: CVA (cerebrovascular accident) 2003   Patient states the only residual deficit is an occasional stutter.   Hyperlipidemia    Hypertension    OSA (obstructive sleep apnea)    wears CPAP q night     Family History  Problem Relation Age of Onset   Cancer Mother    Stroke Father    Heart disease Father      Past Surgical History:  Procedure Laterality Date   ANAL FISTULOTOMY N/A 05/29/2021   Procedure: WIDE EXCISION OF HYDRADINITIS;  Surgeon: Leighton Ruff, MD;  Location: [facility];  Service: General;  Laterality: N/A;   COLONOSCOPY  12/21/2019   LEFT HEART CATH AND CORONARY ANGIOGRAPHY N/A 08/14/2022   Procedure: LEFT HEART CATH AND CORONARY ANGIOGRAPHY;  Surgeon: Adrian Prows, MD;  Location: [facility];  Service: Cardiovascular;  Laterality: N/A;    Social History   Socioeconomic History   Marital status: Single    Spouse name: Not on file   Number of children: 2   Years of education: College   Highest education level: Not on file  Occupational History   Occupation: retired  Tobacco Use   Smoking status: Every Day    Packs/day: 0.25    Years: 52.00    Total pack years: 13.00    Types: Cigarettes    Start date: 1965   Smokeless tobacco: Never   Tobacco comments:    he has cut back number cigs/smoked,   Vaping Use   Vaping Use: Never used  Substance and Sexual Activity   Alcohol use: Yes    Alcohol/week: 0.0 standard drinks of alcohol    Comment:  Occasional    Drug use: Yes    Types: Marijuana    Comment: every once in a while, (as of 05/22/21 patient last smoked marijauna on 05/21/2021)   Sexual activity: Yes  Other Topics Concern   Not on file  Social History Narrative   Some caffeine use    Social Determinants of Health   Financial Resource Strain: Low Risk  (02/19/2022)   Overall Financial Resource Strain (CARDIA)    Difficulty of Paying Living Expenses: Not hard at all  Food Insecurity: No Food Insecurity (02/19/2022)   Hunger Vital Sign    Worried About Running Out of Food in the Last Year: Never true    Ran Out of Food in the Last Year: Never true  Transportation Needs: No Transportation Needs (02/19/2022)   PRAPARE - Hydrologist (Medical): No    Lack of Transportation (Non-Medical): No  Physical Activity: Inactive (02/19/2022)   Exercise Vital Sign    Days of Exercise per Week: 0 days    Minutes of Exercise per Session: 0 min  Stress: No Stress Concern Present (02/19/2022)   [place]    Feeling of Stress : Not at all  Social Connections: Not on file  Intimate Partner Violence: Not At Risk (01/10/2019)  Humiliation, Afraid, Rape, and Kick questionnaire    Fear of Current or Ex-Partner: No    Emotionally Abused: No    Physically Abused: No    Sexually Abused: No     No Known Allergies   Outpatient Medications Prior to Visit  Medication Sig Dispense Refill   acetaminophen (TYLENOL) 500 MG tablet Take 500-1,000 mg by mouth every 6 (six) hours as needed (pain.).     aspirin EC 81 MG tablet Take 81 mg by mouth in the morning.     atorvastatin (LIPITOR) 10 MG tablet TAKE ONE TABLET ONCE DAILY BY MOUTH MONDAY, WEDNESDAY AND FRIDAY WTIH EVENING MEAL 36 tablet 2   EDARBYCLOR 40-25 MG TABS TAKE ONE TABLET DAILY IN THE MORNING 90 tablet 1   Omega-3 Fatty Acids (FISH OIL PO) Take 1 capsule by mouth in the morning.      potassium chloride SA (KLOR-CON M) 20 MEQ tablet TAKE 1&1/2 tabS by MOUTH daily 135 tablet 2   spironolactone (ALDACTONE) 25 MG tablet TAKE 1 TABLET BY MOUTH IN THE  MORNING 100 tablet 2   tadalafil (CIALIS) 5 MG tablet One tab po qd 90 tablet 0   triamcinolone ointment (KENALOG) 0.1 % Apply 1 application  topically 2 (two) times daily as needed (eczema).     verapamil (CALAN-SR) 240 MG CR tablet TAKE 1 TABLET BY MOUTH TWICE  DAILY 200 tablet 2   VITAMIN D PO Take 1 capsule by mouth in the morning.     No facility-administered medications prior to visit.       Objective:   Physical Exam:  General appearance: 72 y.o., male, NAD, conversant  Eyes: anicteric sclerae; PERRL, tracking appropriately HENT: NCAT; MMM Neck: Trachea midline; no lymphadenopathy, no JVD Lungs: CTAB, no crackles, no wheeze, with normal respiratory effort CV: RRR, no murmur  Abdomen: Soft, non-tender; non-distended, BS present  Extremities: No peripheral edema, warm Skin: Normal turgor and texture; no rash Psych: Appropriate affect Neuro: Alert and oriented to person and place, no focal deficit     There were no vitals filed for this visit.   on *** LPM *** RA BMI Readings from Last 3 Encounters:  08/14/22 31.12 kg/m  08/10/22 34.73 kg/m  08/04/22 35.01 kg/m   Wt Readings from Last 3 Encounters:  08/14/22 249 lb (112.9 kg)  08/10/22 249 lb (112.9 kg)  08/04/22 252 lb 6.4 oz (114.5 kg)     CBC    Component Value Date/Time   WBC 6.5 08/04/2022 1016   RBC 4.37 08/04/2022 1016   HGB 13.8 08/04/2022 1016   HCT 39.7 08/04/2022 1016   PLT 311 08/04/2022 1016   MCV 91 08/04/2022 1016   MCH 31.6 08/04/2022 1016   MCHC 34.8 08/04/2022 1016   RDW 12.7 08/04/2022 1016      Chest Imaging: LDCT Chest 10/15/21 with subtle emphysema, ?LUL ggo slice 95  Pulmonary Functions Testing Results:     No data to display           Echocardiogram:   Echocardiogram 04/23/2022: Mildly depressed LV  systolic function with visual EF 45-50%. Left ventricle cavity is normal in size. Normal left ventricular wall thickness. Doppler evidence of grade I (impaired) diastolic dysfunction, normal LAP. Left ventricle regional wall motion findings: Basal inferior, Mid inferior, Mid inferoseptal, Apical anterior, Apical inferior, Apical septal and Apical cap hypokinesis. No significant valvular heart disease. Compared to 04/15/2017 LVEF reduced from 50-55% to 45-50%, RWMA are new, otherwise no significant change.  Assessment & Plan:    Plan:      Maryjane Hurter, MD [facility] 08/25/2022 4:40 PM

## 2022-08-26 ENCOUNTER — Institutional Professional Consult (permissible substitution): Payer: Medicare Other | Admitting: Student

## 2022-09-09 ENCOUNTER — Encounter (HOSPITAL_BASED_OUTPATIENT_CLINIC_OR_DEPARTMENT_OTHER): Payer: Self-pay | Admitting: Pulmonary Disease

## 2022-09-09 ENCOUNTER — Ambulatory Visit (INDEPENDENT_AMBULATORY_CARE_PROVIDER_SITE_OTHER): Payer: Medicare Other | Admitting: Pulmonary Disease

## 2022-09-09 VITALS — BP 140/70 | HR 83 | Ht 75.0 in | Wt 251.0 lb

## 2022-09-09 DIAGNOSIS — J4489 Other specified chronic obstructive pulmonary disease: Secondary | ICD-10-CM | POA: Diagnosis not present

## 2022-09-09 DIAGNOSIS — J439 Emphysema, unspecified: Secondary | ICD-10-CM | POA: Insufficient documentation

## 2022-09-09 DIAGNOSIS — J432 Centrilobular emphysema: Secondary | ICD-10-CM | POA: Insufficient documentation

## 2022-09-09 MED ORDER — ALBUTEROL SULFATE HFA 108 (90 BASE) MCG/ACT IN AERS: 2.0000 | INHALATION_SPRAY | Freq: Four times a day (QID) | RESPIRATORY_TRACT | 2 refills | Status: DC | PRN

## 2022-09-09 NOTE — Progress Notes (Signed)
Subjective:   [name] GENDER: male DOB: [birth year], MRN: [id number]  Chief Complaint  Patient presents with   Consult    No concerns    Reason for Visit: New consult for emphysema  [name] is 72 year old male active smoker with COPD, OSA on CPAP, HTN, HLD, hx stroke in 2003 with residual deficits who presents for evaluation for COPD.  He recently has been worked up for shortness of breath with recent abnormal stress test followed by negative LHC with normal coronary arteries. He is active at baseline, able to walk his dogs, chop his own wood. He has shortness of breath with exertion. Denies associated cough or wheezing. He is an active smoker with a pack lasting 2-5 days. Considers himself a social smoker.   Social History: Active smoker. Started as a teenager. Smokes at most 1/2 ppd x 50 years Smokes MJ 2-3 days a week [place]  I have personally reviewed patient's past medical/family/social history, allergies, current medications.  Past Medical History:  Diagnosis Date   Diabetes mellitus without complication ([place])    GERD (gastroesophageal reflux disease)    H/O: CVA (cerebrovascular accident) 2003   Patient states the only residual deficit is an occasional stutter.   Hyperlipidemia    Hypertension    OSA (obstructive sleep apnea)    wears CPAP q night     Family History  Problem Relation Age of Onset   Cancer Mother    Stroke Father    Heart disease Father      Social History   Occupational History   Occupation: retired  Tobacco Use   Smoking status: Every Day    Packs/day: 0.50    Years: 52.00    Total pack years: 26.00    Types: Cigarettes    Start date: 1965   Smokeless tobacco: Never   Tobacco comments:    he has cut back number cigs/smoked,   Vaping Use   Vaping Use: Never used  Substance and Sexual Activity   Alcohol use: Yes    Alcohol/week: 0.0 standard drinks of alcohol    Comment: Occasional     Drug use: Yes    Types: Marijuana    Comment: every once in a while, (as of 05/22/21 patient last smoked marijauna on 05/21/2021)   Sexual activity: Yes    No Known Allergies   Outpatient Medications Prior to Visit  Medication Sig Dispense Refill   acetaminophen (TYLENOL) 500 MG tablet Take 500-1,000 mg by mouth every 6 (six) hours as needed (pain.).     aspirin EC 81 MG tablet Take 81 mg by mouth in the morning.     atorvastatin (LIPITOR) 10 MG tablet TAKE ONE TABLET ONCE DAILY BY MOUTH MONDAY, WEDNESDAY AND FRIDAY WTIH EVENING MEAL 36 tablet 2   EDARBYCLOR 40-25 MG TABS TAKE ONE TABLET DAILY IN THE MORNING 90 tablet 1   Omega-3 Fatty Acids (FISH OIL PO) Take 1 capsule by mouth in the morning.     potassium chloride SA (KLOR-CON M) 20 MEQ tablet TAKE 1&1/2 tabS by MOUTH daily 135 tablet 2   spironolactone (ALDACTONE) 25 MG tablet TAKE 1 TABLET BY MOUTH IN THE  MORNING 100 tablet 2   tadalafil (CIALIS) 5 MG tablet One tab po qd 90 tablet 0   triamcinolone ointment (KENALOG) 0.1 % Apply 1 application  topically 2 (two) times daily as needed (eczema).     verapamil (CALAN-SR) 240  MG CR tablet TAKE 1 TABLET BY MOUTH TWICE  DAILY 200 tablet 2   VITAMIN D PO Take 1 capsule by mouth in the morning.     No facility-administered medications prior to visit.    Review of Systems  Constitutional:  Negative for chills, diaphoresis, fever, malaise/fatigue and weight loss.  HENT:  Negative for congestion.   Respiratory:  Positive for shortness of breath. Negative for cough, hemoptysis, sputum production and wheezing.   Cardiovascular:  Positive for leg swelling. Negative for chest pain and palpitations.     Objective:   Vitals:   09/09/22 1331  BP: (!) 140/70  Pulse: 83  SpO2: 100%  Weight: 251 lb (113.9 kg)  Height: '6\' 3"'$  (1.905 m)   SpO2: 100 % O2 Device: None (Room air)  Physical Exam: General: Well-appearing, no acute distress HENT: [place], AT Eyes: EOMI, no scleral  icterus Respiratory: Clear to auscultation bilaterally.  No crackles, wheezing or rales Cardiovascular: RRR, -M/R/G, no JVD Extremities:-Edema,-tenderness Neuro: AAO x4, CNII-XII grossly intact Psych: Normal mood, normal affect  Data Reviewed:  Imaging: CT Chest Lung 10/14/21 - Centrilobular emphysema. Respiratory bronchiolitis. 1 cm ground glass nodule in LUL.  PFT: None on file  Labs: CBC    Component Value Date/Time   WBC 6.5 08/04/2022 1016   RBC 4.37 08/04/2022 1016   HGB 13.8 08/04/2022 1016   HCT 39.7 08/04/2022 1016   PLT 311 08/04/2022 1016   MCV 91 08/04/2022 1016   MCH 31.6 08/04/2022 1016   MCHC 34.8 08/04/2022 1016   RDW 12.7 08/04/2022 1016   Normal blood counts     Assessment & Plan:   Discussion: 72 year old male active smoker with COPD, OSA on CPAP, HTN, HLD, hx stroke in 2003 with residual deficits who presents for evaluation for COPD. Discussed clinical course and management of COPD including bronchodilator regimen and action plan for exacerbation.  COPD with emphysema --ORDER Albuterol AS NEEDED for shortness of breath --ORDER pulmonary function tests  LUL groundglass nodule, 1 cm --Due for CT Lung screen 10/15/22  Tobacco abuse Patient is an active smoker. Not ready to quit. He will let us know when he is ready.  Declines counseling. We discussed smoking cessation for <3 minutes.   Health Maintenance Immunization History  Administered Date(s) Administered   PFIZER(Purple Top)SARS-COV-2 Vaccination 08/22/2019, 09/12/2019, 05/07/2020, 12/20/2020   Pfizer Covid-19 Vaccine Bivalent Booster 5y-11y 06/23/2021   Tdap 02/18/2017   CT Lung Screen- Due for 10/2022  Orders Placed This Encounter  Procedures   Pulmonary function test    Standing Status:   Future    Standing Expiration Date:   09/10/2023    Order Specific Question:   Where should this test be performed?    Answer:    Pulmonary    Order Specific Question:   Full PFT: includes  the following: basic spirometry, spirometry pre & post bronchodilator, diffusion capacity (DLCO), lung volumes    Answer:   Full PFT   Meds ordered this encounter  Medications   albuterol (VENTOLIN HFA) 108 (90 Base) MCG/ACT inhaler    Sig: Inhale 2 puffs into the lungs every 6 (six) hours as needed for wheezing or shortness of breath.    Dispense:  8 g    Refill:  2    Return in about 3 months (around 12/08/2022).  I have spent a total time of 45-minutes on the day of the appointment reviewing prior documentation, coordinating care and discussing medical diagnosis and plan with  the patient/family. Imaging, labs and tests included in this note have been reviewed and interpreted independently by me.  [place], MD [facility] 09/09/2022 9:36 PM  Office Number [phone]

## 2022-09-09 NOTE — Patient Instructions (Addendum)
  COPD with emphysema --ORDER Albuterol AS NEEDED for shortness of breath --ORDER pulmonary function tests  LUL groundglass nodule, 1 cm --Due for CT Lung screen 10/15/22  Tobacco abuse Patient is an active smoker. Not ready to quit  Follow-up with me in 3 months with PFTs prior to visit

## 2022-09-21 ENCOUNTER — Ambulatory Visit: Payer: Medicare Other | Admitting: Cardiology

## 2022-09-21 ENCOUNTER — Encounter: Payer: Self-pay | Admitting: Cardiology

## 2022-09-21 VITALS — BP 128/71 | HR 72 | Resp 16 | Ht 75.0 in | Wt 255.0 lb

## 2022-09-21 DIAGNOSIS — I119 Hypertensive heart disease without heart failure: Secondary | ICD-10-CM

## 2022-09-21 DIAGNOSIS — R9439 Abnormal result of other cardiovascular function study: Secondary | ICD-10-CM | POA: Diagnosis not present

## 2022-09-21 DIAGNOSIS — E7800 Pure hypercholesterolemia, unspecified: Secondary | ICD-10-CM

## 2022-09-21 NOTE — Progress Notes (Signed)
Primary Physician/Referring:  Glendale Chard, MD  [name], male    DOB: 1950/11/10, 72 y.o.   MRN: [id number]   Chief Complaint  Patient presents with   Shortness of Breath   Follow-up    Post cath 6 week    HPI:    Terry Andrews  is a 72 y.o. African-American male with hypertension, history of remote stroke in 2003 with no residual defects probably related to hypertension, tobacco use disorder, obstructive sleep apnea on CPAP.    Except for mild chronic dyspnea that he had not stated much before, states that he is doing well.  Still able to do most of his physical activity without any significant limitations.  No PND or orthopnea or leg edema.  Due to abnormal nuclear stress test and also echocardiogram, he was recommended cardiac catheterization.  Cardiac catheterization on 08/14/2022 and surprisingly he had normal coronary arteries. He presents here to discuss further.  Past Medical History:  Diagnosis Date   Diabetes mellitus without complication ([place])    GERD (gastroesophageal reflux disease)    H/O: CVA (cerebrovascular accident) 2003   Patient states the only residual deficit is an occasional stutter.   Hyperlipidemia    Hypertension    OSA (obstructive sleep apnea)    wears CPAP q night   Past Surgical History:  Procedure Laterality Date   ANAL FISTULOTOMY N/A 05/29/2021   Procedure: WIDE EXCISION OF HYDRADINITIS;  Surgeon: Leighton Ruff, MD;  Location: [facility];  Service: General;  Laterality: N/A;   COLONOSCOPY  12/21/2019   LEFT HEART CATH AND CORONARY ANGIOGRAPHY N/A 08/14/2022   Procedure: LEFT HEART CATH AND CORONARY ANGIOGRAPHY;  Surgeon: Adrian Prows, MD;  Location: [place] [facility];  Service: Cardiovascular;  Laterality: N/A;   Family History  Problem Relation Age of Onset   Cancer Mother    Stroke Father    Heart disease Father     Social History   Tobacco Use   Smoking status: Every Day    Packs/day: 0.50     Years: 52.00    Total pack years: 26.00    Types: Cigarettes    Start date: 1965   Smokeless tobacco: Never   Tobacco comments:    he has cut back number cigs/smoked,   Substance Use Topics   Alcohol use: Yes    Alcohol/week: 0.0 standard drinks of alcohol    Comment: Occasional    Marital Status: Single  ROS  Review of Systems  Cardiovascular:  Positive for dyspnea on exertion. Negative for chest pain and leg swelling.  Gastrointestinal:  Negative for melena.   Objective  Blood pressure 128/71, pulse 72, resp. rate 16, height 6' 3"$  (1.905 m), weight 255 lb (115.7 kg), SpO2 97 %.      09/21/2022   12:22 PM 09/09/2022    1:31 PM 08/14/2022    1:30 PM  Vitals with BMI  Height 6' 3"$  6' 3"$    Weight 255 lbs 251 lbs   BMI 0000000 [id number]   Systolic 0000000 [id number]  Diastolic 71 70 65  Pulse 72 83 57     Physical Exam HENT:     Head: Atraumatic.  Neck:     Vascular: No carotid bruit or JVD.  Cardiovascular:     Rate and Rhythm: Normal rate and regular rhythm.     Pulses: Intact distal pulses.     Heart sounds: S1 normal and S2 normal. Murmur heard.  Systolic murmur is present with a grade of 2/6 at the upper right sternal border.     No gallop.  Pulmonary:     Effort: Pulmonary effort is normal.     Breath sounds: Normal breath sounds.  Abdominal:     General: Bowel sounds are normal.     Palpations: Abdomen is soft.  Musculoskeletal:     Right lower leg: No edema.     Left lower leg: No edema.    Laboratory examination:   Lab Results  Component Value Date   NA 140 08/04/2022   K 3.5 08/04/2022   CO2 24 08/04/2022   GLUCOSE 115 (H) 08/04/2022   BUN 10 08/04/2022   CREATININE 1.00 08/04/2022   CALCIUM 9.7 08/04/2022   EGFR 81 08/04/2022   GFRNONAA 84 09/23/2020      Latest Ref Rng & Units 08/04/2022   10:21 AM 03/30/2022   10:23 AM 12/23/2021    3:47 PM  CMP  Glucose 70 - 99 mg/dL 115  114    BUN 8 - 27 mg/dL 10  11    Creatinine 0.76 - 1.27 mg/dL 1.00  0.97     Sodium 134 - 144 mmol/L 140  140    Potassium 3.5 - 5.2 mmol/L 3.5  3.6    Chloride 96 - 106 mmol/L 101  101    CO2 20 - 29 mmol/L 24  25    Calcium 8.6 - 10.2 mg/dL 9.7  10.1    Total Protein 6.0 - 8.5 g/dL  7.1    Total Bilirubin 0.0 - 1.2 mg/dL  0.3    Alkaline Phos 44 - 121 IU/L  56    AST 0 - 40 IU/L  19    ALT 0 - 44 IU/L  18  23       Latest Ref Rng & Units 08/04/2022   10:16 AM 05/29/2021    6:04 AM 03/24/2021    9:54 AM  CBC  WBC 3.4 - 10.8 x10E3/uL 6.5   6.6   Hemoglobin 13.0 - 17.7 g/dL 13.8  14.6  14.9   Hematocrit 37.5 - 51.0 % 39.7  43.0  43.9   Platelets 150 - 450 x10E3/uL 311   274     Lipid Panel Recent Labs    12/23/21 1547  CHOL 105  TRIG 43  LDLCALC 42  HDL 52  CHOLHDL 2.0    HEMOGLOBIN A1C Lab Results  Component Value Date   HGBA1C 6.1 (H) 08/04/2022   Lab Results  Component Value Date   TSH 0.786 09/23/2020     Medications and allergies  No Known Allergies   Current Outpatient Medications:    acetaminophen (TYLENOL) 500 MG tablet, Take 500-1,000 mg by mouth every 6 (six) hours as needed (pain.)., Disp: , Rfl:    albuterol (VENTOLIN HFA) 108 (90 Base) MCG/ACT inhaler, Inhale 2 puffs into the lungs every 6 (six) hours as needed for wheezing or shortness of breath., Disp: 8 g, Rfl: 2   aspirin EC 81 MG tablet, Take 81 mg by mouth in the morning., Disp: , Rfl:    atorvastatin (LIPITOR) 10 MG tablet, TAKE ONE TABLET ONCE DAILY BY MOUTH MONDAY, WEDNESDAY AND FRIDAY WTIH EVENING MEAL, Disp: 36 tablet, Rfl: 2   EDARBYCLOR 40-25 MG TABS, TAKE ONE TABLET DAILY IN THE MORNING, Disp: 90 tablet, Rfl: 1   Omega-3 Fatty Acids (FISH OIL PO), Take 1 capsule by mouth in the morning., Disp: , Rfl:  potassium chloride SA (KLOR-CON M) 20 MEQ tablet, TAKE 1&1/2 tabS by MOUTH daily, Disp: 135 tablet, Rfl: 2   spironolactone (ALDACTONE) 25 MG tablet, TAKE 1 TABLET BY MOUTH IN THE  MORNING, Disp: 100 tablet, Rfl: 2   tadalafil (CIALIS) 5 MG tablet, One tab po qd,  Disp: 90 tablet, Rfl: 0   triamcinolone ointment (KENALOG) 0.1 %, Apply 1 application  topically 2 (two) times daily as needed (eczema)., Disp: , Rfl:    verapamil (CALAN-SR) 240 MG CR tablet, TAKE 1 TABLET BY MOUTH TWICE  DAILY, Disp: 200 tablet, Rfl: 2   VITAMIN D PO, Take 1 capsule by mouth in the morning., Disp: , Rfl:   Radiology:   No results found.  Cardiac Studies:   Abdominal Aortic Duplex  06/07/2019:  Normal abdominal aorta. No evidence of aneurysm.  Incidental 1.3x1.6 heterogeneous mass right kidney apex noted, consider  dedicated US of the kidneys.  See attached image. (will be ordered)  PCV MYOCARDIAL PERFUSION WO LEXISCAN 05/12/2022  Narrative Exercise nuclear stress test 05/12/2022: There is a fixed severe defect in the anterior, inferior, septal and apical regions. LV is mildly dilated in both rest and stress. Overall LV systolic function is abnormal without regional wall motion abnormalities. Mild global hypokinesis of the left ventricle. Stress LV EF: 26%. Normal ECG stress. The patient exercised for 4 minutes and 57 seconds of a Bruce protocol, achieving approximately 6.94 METs. and achieved 88% of MPHR. The blood pressure response was normal. High risk study.  NO prior studies for comparison.  PCV ECHOCARDIOGRAM COMPLETE 04/23/2022  Narrative Echocardiogram 04/23/2022: Mildly depressed LV systolic function with visual EF 45-50%. Left ventricle cavity is normal in size. Normal left ventricular wall thickness. Doppler evidence of grade I (impaired) diastolic dysfunction, normal LAP. Left ventricle regional wall motion findings: Basal inferior, Mid inferior, Mid inferoseptal, Apical anterior, Apical inferior, Apical septal and Apical cap hypokinesis. No significant valvular heart disease. Compared to 04/15/2017 LVEF reduced from 50-55% to 45-50%, RWMA are new, otherwise no significant change.  Left Heart Catheterization 08/14/22:  LV not performed RCA:  Large-caliber vessel, dominant, smooth and normal. LM: Large-caliber vessel.  Smooth and normal. LAD: Large-caliber vessel giving origin to a moderate-sized D1 and a large D2 and the larger D3.  It is smooth and normal. LCx: Large-caliber vessel gives origin to a large obtuse marginal 1 which has secondary branches.  AV groove circumflex is moderate-sized.  Smooth and normal.     Impression: Normal coronary arteries.  Stress test abnormalities probably related to hypertensive heart disease.  30 mill contrast utilized.  EKG  EKG  08/10/2022:  Normal sinus rhythm at the rate of 81 bpm,  IVCD,  Incomplete left bundle branch block.  Borderline criteria for LVH.  Anteroseptal infarct old.  Nonspecific T abnormality.  Compared to 825  2023,  No significant change.  LAE,  Assessment     ICD-10-CM   1. Abnormal nuclear stress test  R94.39     2. Hypertensive heart disease without heart failure  I11.9     3. Pure hypercholesterolemia  E78.00       There are no discontinued medications.   No orders of the defined types were placed in this encounter.  Recommendations:   Terry Andrews is a 72 y.o. African-American male with hypertension, history of remote stroke in 2003 with no residual defects probably related to hypertension, tobacco use disorder, obstructive sleep apnea on CPAP, hypertension, hypercholesterolemia, tobacco use disorder presents here for 6-week  office visit.  1. Abnormal nuclear stress test  Cardiac catheterization on 08/14/2022 and surprisingly he had normal coronary arteries.  2. Hypertensive heart disease without heart failure Blood pressure under excellent control.  3.  Hypercholesterolemia Patient has history of prior stroke in 2003 with no recurrence.  Lipids are at goal.  Continue primary/secondary prevention.  I was going to see him back on a as needed basis however patient would like to see me at least on an annual basis.  Hence I will see him back sometime in  February 2025.   Adrian Prows, MD, [facility] 09/21/2022, 12:55 PM Office: [phone]

## 2022-09-28 DIAGNOSIS — H0288B Meibomian gland dysfunction left eye, upper and lower eyelids: Secondary | ICD-10-CM | POA: Diagnosis not present

## 2022-09-28 DIAGNOSIS — H0288A Meibomian gland dysfunction right eye, upper and lower eyelids: Secondary | ICD-10-CM | POA: Diagnosis not present

## 2022-09-28 DIAGNOSIS — H16223 Keratoconjunctivitis sicca, not specified as Sjogren's, bilateral: Secondary | ICD-10-CM | POA: Diagnosis not present

## 2022-10-04 ENCOUNTER — Other Ambulatory Visit: Payer: Self-pay | Admitting: Cardiology

## 2022-10-12 LAB — COMPREHENSIVE METABOLIC PANEL
Albumin: 4 (ref 3.5–5.0)
Calcium: 9.4 (ref 8.7–10.7)
eGFR: 41

## 2022-10-12 LAB — BASIC METABOLIC PANEL
BUN: 14 (ref 4–21)
CO2: 25 — AB (ref 13–22)
Chloride: 106 (ref 99–108)
Creatinine: 1.9 — AB (ref 0.6–1.3)
Glucose: 92
Potassium: 5 mEq/L (ref 3.5–5.1)
Sodium: 139 (ref 137–147)

## 2022-10-19 ENCOUNTER — Ambulatory Visit
Admission: RE | Admit: 2022-10-19 | Discharge: 2022-10-19 | Disposition: A | Discharge status: Home / Self Care | Payer: Medicare Other | Source: Ambulatory Visit | Attending: Internal Medicine | Admitting: Internal Medicine

## 2022-10-19 DIAGNOSIS — Z87891 Personal history of nicotine dependence: Secondary | ICD-10-CM | POA: Diagnosis not present

## 2022-10-19 DIAGNOSIS — F17210 Nicotine dependence, cigarettes, uncomplicated: Secondary | ICD-10-CM

## 2022-11-12 ENCOUNTER — Other Ambulatory Visit: Payer: Self-pay | Admitting: Cardiology

## 2022-12-14 ENCOUNTER — Encounter: Payer: Self-pay | Admitting: Internal Medicine

## 2022-12-14 ENCOUNTER — Encounter: Payer: Medicare Other | Admitting: Internal Medicine

## 2022-12-16 ENCOUNTER — Encounter (HOSPITAL_BASED_OUTPATIENT_CLINIC_OR_DEPARTMENT_OTHER): Payer: Medicare Other

## 2022-12-16 ENCOUNTER — Ambulatory Visit (HOSPITAL_BASED_OUTPATIENT_CLINIC_OR_DEPARTMENT_OTHER): Payer: Medicare Other | Admitting: Pulmonary Disease

## 2022-12-31 ENCOUNTER — Encounter: Payer: Self-pay | Admitting: Internal Medicine

## 2022-12-31 ENCOUNTER — Ambulatory Visit (INDEPENDENT_AMBULATORY_CARE_PROVIDER_SITE_OTHER): Payer: Medicare Other | Admitting: Internal Medicine

## 2022-12-31 VITALS — BP 122/80 | HR 75 | Temp 98.2°F | Ht 75.0 in | Wt 251.2 lb

## 2022-12-31 DIAGNOSIS — F17210 Nicotine dependence, cigarettes, uncomplicated: Secondary | ICD-10-CM | POA: Diagnosis not present

## 2022-12-31 DIAGNOSIS — E6609 Other obesity due to excess calories: Secondary | ICD-10-CM

## 2022-12-31 DIAGNOSIS — Z0000 Encounter for general adult medical examination without abnormal findings: Secondary | ICD-10-CM

## 2022-12-31 DIAGNOSIS — R7309 Other abnormal glucose: Secondary | ICD-10-CM | POA: Diagnosis not present

## 2022-12-31 DIAGNOSIS — I119 Hypertensive heart disease without heart failure: Secondary | ICD-10-CM | POA: Diagnosis not present

## 2022-12-31 DIAGNOSIS — I2510 Atherosclerotic heart disease of native coronary artery without angina pectoris: Secondary | ICD-10-CM | POA: Diagnosis not present

## 2022-12-31 DIAGNOSIS — Z6831 Body mass index (BMI) 31.0-31.9, adult: Secondary | ICD-10-CM

## 2022-12-31 DIAGNOSIS — Z0001 Encounter for general adult medical examination with abnormal findings: Secondary | ICD-10-CM | POA: Diagnosis not present

## 2022-12-31 DIAGNOSIS — I700 Atherosclerosis of aorta: Secondary | ICD-10-CM | POA: Diagnosis not present

## 2022-12-31 DIAGNOSIS — J432 Centrilobular emphysema: Secondary | ICD-10-CM | POA: Diagnosis not present

## 2022-12-31 DIAGNOSIS — N400 Enlarged prostate without lower urinary tract symptoms: Secondary | ICD-10-CM

## 2022-12-31 LAB — POCT URINALYSIS DIPSTICK
Bilirubin, UA: NEGATIVE
Blood, UA: NEGATIVE
Glucose, UA: NEGATIVE
Ketones, UA: NEGATIVE
Leukocytes, UA: NEGATIVE
Nitrite, UA: NEGATIVE
Protein, UA: NEGATIVE
Spec Grav, UA: >=1.03 — AB (ref 1.010–1.025)
Urobilinogen, UA: 0.2 E.U./dL
pH, UA: 5.5 (ref 5.0–8.0)

## 2022-12-31 MED ORDER — CIPROFLOXACIN-DEXAMETHASONE 0.3-0.1 % OT SUSP: 4.0000 [drp] | Freq: Two times a day (BID) | OTIC | 0 refills | Status: DC

## 2022-12-31 NOTE — Progress Notes (Signed)
I,Victoria T Hamilton,acting as a scribe for Gwynneth Aliment, MD.,have documented all relevant documentation on the behalf of Gwynneth Aliment, MD,as directed by  Gwynneth Aliment, MD while in the presence of Gwynneth Aliment, MD.   Subjective:     [name] , male    DOB: [birth year] , 72 y.o.   MRN: [id number]   Chief Complaint  Patient presents with   Annual Exam   Hypertension    HPI  Patient here for annual exam.  He has his prostate exams performed by Dr. Berneice Heinrich at [facility]. He reports compliance with meds. He denies headaches, chest pain and shortness of breath. He has no specific concerns or complaints at this time.   Hypertension This is a chronic problem. The current episode started more than 1 year ago. The problem has been gradually improving since onset. The problem is controlled. Pertinent negatives include no blurred vision, chest pain, palpitations or shortness of breath. Risk factors for coronary artery disease include obesity, sedentary lifestyle, smoking/tobacco exposure and male gender. Past treatments include diuretics and angiotensin blockers. Compliance problems include exercise.      Past Medical History:  Diagnosis Date   Diabetes mellitus without complication (HCC)    GERD (gastroesophageal reflux disease)    H/O: CVA (cerebrovascular accident) 2003   Patient states the only residual deficit is an occasional stutter.   Hyperlipidemia    Hypertension    OSA (obstructive sleep apnea)    wears CPAP q night     Family History  Problem Relation Age of Onset   Cancer Mother    Stroke Father    Heart disease Father      Current Outpatient Medications:    acetaminophen (TYLENOL) 500 MG tablet, Take 500-1,000 mg by mouth every 6 (six) hours as needed (pain.)., Disp: , Rfl:    albuterol (VENTOLIN HFA) 108 (90 Base) MCG/ACT inhaler, Inhale 2 puffs into the lungs every 6 (six) hours as needed for wheezing or shortness of breath., Disp: 8 g,  Rfl: 2   aspirin EC 81 MG tablet, Take 81 mg by mouth in the morning., Disp: , Rfl:    atorvastatin (LIPITOR) 10 MG tablet, TAKE ONE TABLET ONCE DAILY BY MOUTH MONDAY, WEDNESDAY AND FRIDAY WTIH EVENING MEAL, Disp: 36 tablet, Rfl: 2   Azilsartan-Chlorthalidone (EDARBYCLOR) 40-25 MG TABS, TAKE ONE TABLET DAILY IN THE MORNING, Disp: 90 tablet, Rfl: 3   ciprofloxacin-dexamethasone (CIPRODEX) OTIC suspension, Place 4 drops into the left ear 2 (two) times daily., Disp: 7.5 mL, Rfl: 0   Omega-3 Fatty Acids (FISH OIL PO), Take 1 capsule by mouth in the morning., Disp: , Rfl:    potassium chloride SA (KLOR-CON M) 20 MEQ tablet, TAKE 1&1/2 tabS by MOUTH daily, Disp: 135 tablet, Rfl: 2   spironolactone (ALDACTONE) 25 MG tablet, TAKE 1 TABLET BY MOUTH IN THE  MORNING, Disp: 100 tablet, Rfl: 2   triamcinolone ointment (KENALOG) 0.1 %, Apply 1 application  topically 2 (two) times daily as needed (eczema)., Disp: , Rfl:    verapamil (CALAN-SR) 240 MG CR tablet, TAKE 1 TABLET BY MOUTH TWICE  DAILY, Disp: 200 tablet, Rfl: 2   VITAMIN D PO, Take 1 capsule by mouth in the morning., Disp: , Rfl:    tadalafil (CIALIS) 5 MG tablet, One tab po qd (Patient not taking: Reported on 12/31/2022), Disp: 90 tablet, Rfl: 0   No Known Allergies   Men's preventive visit. Patient Health Questionnaire (PHQ-2) is  Flowsheet Row Clinical Support from 02/19/2022 in [facility]  PHQ-2 Total Score 0     . Patient is on a heart healthy diet. Marital status: Single. Relevant history for alcohol use is:  Social History   Substance and Sexual Activity  Alcohol Use Yes   Alcohol/week: 0.0 standard drinks of alcohol   Comment: Occasional   . Relevant history for tobacco use is:  Social History   Tobacco Use  Smoking Status Every Day   Packs/day: 0.50   Years: 52.00   Additional pack years: 0.00   Total pack years: 26.00   Types: Cigarettes   Start date: 1965  Smokeless Tobacco Never   Tobacco Comments   he has cut back number cigs/smoked,   .   Review of Systems  Constitutional: Negative.   HENT: Negative.    Eyes: Negative.  Negative for blurred vision.  Respiratory: Negative.  Negative for shortness of breath.   Cardiovascular: Negative.  Negative for chest pain and palpitations.  Gastrointestinal: Negative.   Endocrine: Negative.   Genitourinary: Negative.   Skin: Negative.   Allergic/Immunologic: Negative.   Neurological: Negative.   Hematological: Negative.   Psychiatric/Behavioral: Negative.       Today's Vitals   12/31/22 0931  BP: 122/80  Pulse: 75  Temp: 98.2 F (36.8 C)  SpO2: 98%  Weight: 251 lb 3.2 oz (113.9 kg)  Height: 6\' 3"  (1.905 m)   Body mass index is 31.4 kg/m.   Objective:  Physical Exam Vitals and nursing note reviewed.  Constitutional:      Appearance: Normal appearance.  HENT:     Head: Normocephalic and atraumatic.     Right Ear: Tympanic membrane, ear canal and external ear normal.     Left Ear: Tympanic membrane, ear canal and external ear normal.     Nose: Nose normal.     Mouth/Throat:     Mouth: Mucous membranes are moist.     Pharynx: Oropharynx is clear.  Eyes:     Extraocular Movements: Extraocular movements intact.     Conjunctiva/sclera: Conjunctivae normal.     Pupils: Pupils are equal, round, and reactive to light.  Cardiovascular:     Rate and Rhythm: Normal rate and regular rhythm.     Pulses: Normal pulses.     Heart sounds: Murmur heard.  Pulmonary:     Effort: Pulmonary effort is normal.     Breath sounds: Normal breath sounds.  Chest:  Breasts:    Right: Normal. No swelling, bleeding, inverted nipple, mass or nipple discharge.     Left: Normal. No swelling, bleeding, inverted nipple, mass or nipple discharge.  Abdominal:     General: Abdomen is flat. Bowel sounds are normal.     Palpations: Abdomen is soft.  Genitourinary:    Prostate: Normal.     Rectum: Normal. Guaiac result negative.      Comments: Deferred  Musculoskeletal:        General: Normal range of motion.     Cervical back: Normal range of motion and neck supple.  Skin:    General: Skin is warm.     Comments: rhinophyma  Neurological:     General: No focal deficit present.     Mental Status: He is alert.  Psychiatric:        Mood and Affect: Mood normal.        Behavior: Behavior normal.      Assessment And Plan:    1. Encounter  for general adult medical examination w/o abnormal findings Comments: A full exam was performed. He has his prostate exams performed by Urology.  PATIENT IS ADVISED TO GET 30-45 MINUTES REGULAR EXERCISE NO LESS THAN FOUR TO FIVE DAYS PER WEEK - BOTH WEIGHTBEARING EXERCISES AND AEROBIC ARE RECOMMENDED.  PATIENT IS ADVISED TO FOLLOW A HEALTHY DIET WITH AT LEAST SIX FRUITS/VEGGIES PER DAY, DECREASE INTAKE OF RED MEAT, AND TO INCREASE FISH INTAKE TO TWO DAYS PER WEEK.  MEATS/FISH SHOULD NOT BE FRIED, BAKED OR BROILED IS PREFERABLE.  IT IS ALSO IMPORTANT TO CUT BACK ON YOUR SUGAR INTAKE. PLEASE AVOID ANYTHING WITH ADDED SUGAR, CORN SYRUP OR OTHER SWEETENERS. IF YOU MUST USE A SWEETENER, YOU CAN TRY STEVIA. IT IS ALSO IMPORTANT TO AVOID ARTIFICIALLY SWEETENERS AND DIET BEVERAGES. LASTLY, I SUGGEST WEARING SPF 50 SUNSCREEN ON EXPOSED PARTS AND ESPECIALLY WHEN IN THE DIRECT SUNLIGHT FOR AN EXTENDED PERIOD OF TIME.  PLEASE AVOID FAST FOOD RESTAURANTS AND INCREASE YOUR WATER INTAKE.  2. Hypertensive heart disease without heart failure Comments: Chronic, controlled. She will c/w Edarbyclor 40/25mg  daily, spironolactone and verapamil 240mg  daily. Advised to follow low sodium diet. Jan EKG reviewed.  He will f/u in 4-6 months for re-evaluation.  - POCT Urinalysis Dipstick (81002) - Microalbumin / Creatinine Urine Ratio - CMP14+EGFR - Lipid panel  3. Atherosclerosis of aorta (HCC) Comments: Chronic, LDL goal < 70.  He is encouraged to c/w statin therapy - atorvastatin 3 days/week dosing. - Lipid  panel  4. Atherosclerosis of native coronary artery of native heart without angina pectoris Comments: Chronic,please see #3. Cardiology input appreciated. - Lipid panel  5. Other abnormal glucose Comments: Previous labs reviewed, her a1c has been elevated in the past. I will recheck an a1c today, advised to limit intake of sugary beverages, foods. - Hemoglobin A1c  6. Cigarette nicotine dependence without complication Comments: Chronic, encouraged to decrease number of cigs smoked per day. He is not yet ready to quit.  7. Centrilobular emphysema (HCC) Comments: Chronic, now followed by Pulmonary. PFTS pending. Has not had a need to use albuterol prn.  8. Benign prostatic hyperplasia, unspecified whether lower urinary tract symptoms present Comments: I will check PSA and forward results to Dr. Berneice Heinrich. - PSA Total (Reflex To Free)  9. Class 1 obesity due to excess calories with serious comorbidity and body mass index (BMI) of 31.0 to 31.9 in adult Comments: He is encouraged to aim for at least 150 minutes of exercise/week, while striving for BMI<30 to decrease cardiac risk.    Return for 1 year physical, 6 month bp. Patient was given opportunity to ask questions. Patient verbalized understanding of the plan and was able to repeat key elements of the plan. All questions were answered to their satisfaction.   I, Gwynneth Aliment, MD, have reviewed all documentation for this visit. The documentation on 12/31/22 for the exam, diagnosis, procedures, and orders are all accurate and complete.  THE PATIENT IS ENCOURAGED TO PRACTICE SOCIAL DISTANCING DUE TO THE COVID-19 PANDEMIC.

## 2022-12-31 NOTE — Patient Instructions (Signed)
Health Maintenance, Male Adopting a healthy lifestyle and getting preventive care are important in promoting health and wellness. Ask your health care provider about: The right schedule for you to have regular tests and exams. Things you can do on your own to prevent diseases and keep yourself healthy. What should I know about diet, weight, and exercise? Eat a healthy diet  Eat a diet that includes plenty of vegetables, fruits, low-fat dairy products, and lean protein. Do not eat a lot of foods that are high in solid fats, added sugars, or sodium. Maintain a healthy weight Body mass index (BMI) is a measurement that can be used to identify possible weight problems. It estimates body fat based on height and weight. Your health care provider can help determine your BMI and help you achieve or maintain a healthy weight. Get regular exercise Get regular exercise. This is one of the most important things you can do for your health. Most adults should: Exercise for at least 150 minutes each week. The exercise should increase your heart rate and make you sweat (moderate-intensity exercise). Do strengthening exercises at least twice a week. This is in addition to the moderate-intensity exercise. Spend less time sitting. Even light physical activity can be beneficial. Watch cholesterol and blood lipids Have your blood tested for lipids and cholesterol at 70 years of age, then have this test every 5 years. You may need to have your cholesterol levels checked more often if: Your lipid or cholesterol levels are high. You are older than 70 years of age. You are at high risk for heart disease. What should I know about cancer screening? Many types of cancers can be detected early and may often be prevented. Depending on your health history and family history, you may need to have cancer screening at various ages. This may include screening for: Colorectal cancer. Prostate cancer. Skin cancer. Lung  cancer. What should I know about heart disease, diabetes, and high blood pressure? Blood pressure and heart disease High blood pressure causes heart disease and increases the risk of stroke. This is more likely to develop in people who have high blood pressure readings or are overweight. Talk with your health care provider about your target blood pressure readings. Have your blood pressure checked: Every 3-5 years if you are 18-39 years of age. Every year if you are 40 years old or older. If you are between the ages of 65 and 75 and are a current or former smoker, ask your health care provider if you should have a one-time screening for abdominal aortic aneurysm (AAA). Diabetes Have regular diabetes screenings. This checks your fasting blood sugar level. Have the screening done: Once every three years after age 45 if you are at a normal weight and have a low risk for diabetes. More often and at a younger age if you are overweight or have a high risk for diabetes. What should I know about preventing infection? Hepatitis B If you have a higher risk for hepatitis B, you should be screened for this virus. Talk with your health care provider to find out if you are at risk for hepatitis B infection. Hepatitis C Blood testing is recommended for: Everyone born from 1945 through 1965. Anyone with known risk factors for hepatitis C. Sexually transmitted infections (STIs) You should be screened each year for STIs, including gonorrhea and chlamydia, if: You are sexually active and are younger than 70 years of age. You are older than 70 years of age and your   health care provider tells you that you are at risk for this type of infection. Your sexual activity has changed since you were last screened, and you are at increased risk for chlamydia or gonorrhea. Ask your health care provider if you are at risk. Ask your health care provider about whether you are at high risk for HIV. Your health care provider  may recommend a prescription medicine to help prevent HIV infection. If you choose to take medicine to prevent HIV, you should first get tested for HIV. You should then be tested every 3 months for as long as you are taking the medicine. Follow these instructions at home: Alcohol use Do not drink alcohol if your health care provider tells you not to drink. If you drink alcohol: Limit how much you have to 0-2 drinks a day. Know how much alcohol is in your drink. In the [place] one drink equals one 12 oz bottle of beer (355 mL), one 5 oz glass of wine (148 mL), or one 1 oz glass of hard liquor (44 mL). Lifestyle Do not use any products that contain nicotine or tobacco. These products include cigarettes, chewing tobacco, and vaping devices, such as e-cigarettes. If you need help quitting, ask your health care provider. Do not use street drugs. Do not share needles. Ask your health care provider for help if you need support or information about quitting drugs. General instructions Schedule regular health, dental, and eye exams. Stay current with your vaccines. Tell your health care provider if: You often feel depressed. You have ever been abused or do not feel safe at home. Summary Adopting a healthy lifestyle and getting preventive care are important in promoting health and wellness. Follow your health care provider's instructions about healthy diet, exercising, and getting tested or screened for diseases. Follow your health care provider's instructions on monitoring your cholesterol and blood pressure. This information is not intended to replace advice given to you by your health care provider. Make sure you discuss any questions you have with your health care provider. Document Revised: 12/09/2020 Document Reviewed: 12/09/2020 Elsevier Patient Education  2024 [employer]  

## 2023-01-01 LAB — CMP14+EGFR
ALT: 16 IU/L (ref 0–44)
AST: 22 IU/L (ref 0–40)
Albumin/Globulin Ratio: 1 — ABNORMAL LOW (ref 1.2–2.2)
Albumin: 3.7 g/dL — ABNORMAL LOW (ref 3.9–4.9)
Alkaline Phosphatase: 59 IU/L (ref 44–121)
BUN/Creatinine Ratio: 14 (ref 10–24)
BUN: 15 mg/dL (ref 8–27)
Bilirubin Total: 0.2 mg/dL (ref 0.0–1.2)
CO2: 21 mmol/L (ref 20–29)
Calcium: 9.9 mg/dL (ref 8.6–10.2)
Chloride: 102 mmol/L (ref 96–106)
Creatinine, Ser: 1.1 mg/dL (ref 0.76–1.27)
Globulin, Total: 3.7 g/dL (ref 1.5–4.5)
Glucose: 104 mg/dL — ABNORMAL HIGH (ref 70–99)
Potassium: 3.6 mmol/L (ref 3.5–5.2)
Sodium: 138 mmol/L (ref 134–144)
Total Protein: 7.4 g/dL (ref 6.0–8.5)
eGFR: 73 mL/min/{1.73_m2} (ref >59)

## 2023-01-01 LAB — LIPID PANEL
Chol/HDL Ratio: 1.8 ratio (ref 0.0–5.0)
Cholesterol, Total: 105 mg/dL (ref 100–199)
HDL: 57 mg/dL (ref >39)
LDL Chol Calc (NIH): 37 mg/dL (ref 0–99)
Triglycerides: 43 mg/dL (ref 0–149)
VLDL Cholesterol Cal: 11 mg/dL (ref 5–40)

## 2023-01-01 LAB — MICROALBUMIN / CREATININE URINE RATIO
Creatinine, Urine: 198.9 mg/dL
Microalb/Creat Ratio: 27 mg/g creat (ref 0–29)
Microalbumin, Urine: 54.6 ug/mL

## 2023-01-01 LAB — HEMOGLOBIN A1C
Est. average glucose Bld gHb Est-mCnc: 131 mg/dL
Hgb A1c MFr Bld: 6.2 % — ABNORMAL HIGH (ref 4.8–5.6)

## 2023-01-01 LAB — PSA TOTAL (REFLEX TO FREE): Prostate Specific Ag, Serum: 1.7 ng/mL (ref 0.0–4.0)

## 2023-01-05 DIAGNOSIS — N281 Cyst of kidney, acquired: Secondary | ICD-10-CM | POA: Diagnosis not present

## 2023-01-06 ENCOUNTER — Other Ambulatory Visit: Payer: Self-pay | Admitting: Urology

## 2023-01-06 DIAGNOSIS — G4733 Obstructive sleep apnea (adult) (pediatric): Secondary | ICD-10-CM | POA: Diagnosis not present

## 2023-01-15 ENCOUNTER — Other Ambulatory Visit: Payer: Self-pay

## 2023-01-15 ENCOUNTER — Encounter (HOSPITAL_BASED_OUTPATIENT_CLINIC_OR_DEPARTMENT_OTHER): Payer: Self-pay | Admitting: Urology

## 2023-01-15 NOTE — Progress Notes (Addendum)
Spoke w/ via phone for pre-op interview---pt Lab needs dos----I stat               Lab results------see below COVID test -----patient states asymptomatic no test needed Arrive at -------1130 am 02-05-2023 NPO after MN NO Solid Food.  Clear liquids from MN until---1030 Med rec completed Medications to take morning of surgery -----verapamil, albuterol inhaler prn/bring inhaler Diabetic medication -----n/a Patient instructed no nail polish to be worn day of surgery Patient instructed to bring photo id and insurance card day of surgery Patient aware to have Driver (ride ) / caregiver   wife margerette  for 24 hours after surgery  Patient Special Instructions -----bring cpap mask tubing and machine and leave in car Pre-Op special Instructions -----none Patient verbalized understanding of instructions that were given at this phone interview. Patient denies shortness of breath, chest pain, fever, cough at this phone interview.  Anesthesia Review: [facility], [facility] 2003 (only residual is occasionally stutters), osa, copd, dm type 2  PCP: dr Jeanice Lim 12-31-2022 epic Cardiologist :dr Lady Deutscher 09-21-2022 epic Cardiac clearance note dr Jacinto Halim dated 01-27-2023 on chart for 02-05-2023 surgery, note states may stop asa for 7 days Chest ct: 10-19-2022 epic EKG :08-10-2022 epic Echo :04-23-2022 epic Stress test 05-12-2022 epic Cardiac Cath : 08-14-2022 Abdominal aorta duplex 06-07-2019 epic Activity level: mows own yard, does household chores without problems, has some sob with activity @ times seeing pulmonary for pft's 05-30-2023 Pulmonary lov 09-09-2022 dr Irma Newness Sleep Study/ CPAP :uses cpap set on 5  Fasting Blood Sugar :      / Checks Blood Sugar -- times a day:  does not check blood sugar Blood Thinner/ Instructions /Last Dose:none ASA / Instructions/ Last Dose : stop 81 mg asa 5 days before surgery per dr Berneice Heinrich instructions, last day to take 81 mg asa 01-30-2023

## 2023-01-25 ENCOUNTER — Encounter: Payer: Self-pay | Admitting: Cardiology

## 2023-01-28 ENCOUNTER — Ambulatory Visit (HOSPITAL_BASED_OUTPATIENT_CLINIC_OR_DEPARTMENT_OTHER): Payer: Medicare Other | Admitting: Pulmonary Disease

## 2023-01-28 ENCOUNTER — Encounter (HOSPITAL_BASED_OUTPATIENT_CLINIC_OR_DEPARTMENT_OTHER): Payer: Self-pay | Admitting: Pulmonary Disease

## 2023-01-28 ENCOUNTER — Encounter (HOSPITAL_BASED_OUTPATIENT_CLINIC_OR_DEPARTMENT_OTHER): Payer: Medicare Other

## 2023-01-28 ENCOUNTER — Ambulatory Visit (INDEPENDENT_AMBULATORY_CARE_PROVIDER_SITE_OTHER): Payer: Medicare Other | Admitting: Pulmonary Disease

## 2023-01-28 VITALS — BP 120/62 | HR 77 | Temp 99.7°F | Ht 76.0 in | Wt 245.2 lb

## 2023-01-28 DIAGNOSIS — J4489 Other specified chronic obstructive pulmonary disease: Secondary | ICD-10-CM | POA: Diagnosis not present

## 2023-01-28 DIAGNOSIS — J432 Centrilobular emphysema: Secondary | ICD-10-CM

## 2023-01-28 DIAGNOSIS — J439 Emphysema, unspecified: Secondary | ICD-10-CM

## 2023-01-28 LAB — PULMONARY FUNCTION TEST
DL/VA % pred: 110 %
DL/VA: 4.4 ml/min/mmHg/L
DLCO cor % pred: 86 %
DLCO cor: 25.35 ml/min/mmHg
DLCO unc % pred: 86 %
DLCO unc: 25.35 ml/min/mmHg
FEF 25-75 Post: 2.48 L/sec
FEF 25-75 Pre: 2.22 L/sec
FEF2575-%Change-Post: 11 %
FEF2575-%Pred-Post: 85 %
FEF2575-%Pred-Pre: 76 %
FEV1-%Change-Post: 8 %
FEV1-%Pred-Post: 78 %
FEV1-%Pred-Pre: 72 %
FEV1-Post: 2.99 L
FEV1-Pre: 2.76 L
FEV1FVC-%Change-Post: 10 %
FEV1FVC-%Pred-Pre: 93 %
FEV6-%Change-Post: 0 %
FEV6-%Pred-Post: 78 %
FEV6-%Pred-Pre: 78 %
FEV6-Post: 3.82 L
FEV6-Pre: 3.84 L
FEV6FVC-%Change-Post: -1 %
FEV6FVC-%Pred-Post: 102 %
FEV6FVC-%Pred-Pre: 104 %
FVC-%Change-Post: -1 %
FVC-%Pred-Post: 75 %
FVC-%Pred-Pre: 77 %
FVC-Post: 3.92 L
FVC-Pre: 3.99 L
Post FEV1/FVC ratio: 76 %
Post FEV6/FVC ratio: 98 %
Pre FEV1/FVC ratio: 69 %
Pre FEV6/FVC Ratio: 99 %
RV % pred: 85 %
RV: 2.28 L
TLC % pred: 78 %
TLC: 6.11 L

## 2023-01-28 NOTE — Progress Notes (Signed)
Full PFT performed today. °

## 2023-01-28 NOTE — Progress Notes (Addendum)
Subjective:   [name] GENDER: male DOB: [birth year], MRN: [id number]  Chief Complaint  Patient presents with   Follow-up    Patient is here to go over PFT results.     Reason for Visit: Follow-up  Terry Andrews is 72 year old male active smoker with COPD, OSA on CPAP, HTN, HLD, hx stroke in 2003 with residual deficits who presents for evaluation for COPD.  Initial consult He recently has been worked up for shortness of breath with recent abnormal stress test followed by negative LHC with normal coronary arteries. He is active at baseline, able to walk his dogs, chop his own wood. He has shortness of breath with exertion. Denies associated cough or wheezing. He is an active smoker with a pack lasting 2-5 days. Considers himself a social smoker.   01/28/23 He reports no changes since our last visit. He has shortness of breath with exertion but will push himself to finish activities. Denies cough or wheezing. Has not used his albuterol. Does not feel like his activities are limited. Preferring not carry his rescue inhaler. Continues to smoke MJ. Reduced cigarette smoking to every few days  Social History: Active smoker. Started as a teenager. Smokes at most 1/2 ppd x 50 years Smokes MJ 2-3 days a week [place]  Past Medical History:  Diagnosis Date   COPD (chronic obstructive pulmonary disease) (HCC)    Diabetes mellitus without complication (HCC) type 2    GERD (gastroesophageal reflux disease)    H/O: CVA (cerebrovascular accident) 2003   Patient states the only residual deficit is an occasional stutter.   Hyperlipidemia    Hypertension    OSA (obstructive sleep apnea)    wears CPAP q night   Wears partial denture upper      Family History  Problem Relation Age of Onset   Cancer Mother    Stroke Father    Heart disease Father      Social History   Occupational History   Occupation: retired  Tobacco Use   Smoking status: Every  Day    Packs/day: 0.50    Years: 52.00    Additional pack years: 0.00    Total pack years: 26.00    Types: Cigarettes    Start date: 1965   Smokeless tobacco: Never   Tobacco comments:    he has cut back number cigs/smoked,   Vaping Use   Vaping Use: Never used  Substance and Sexual Activity   Alcohol use: Yes    Comment: 1 to 2 beers per day   Drug use: Yes    Types: Marijuana    Comment: every once in a while, (as of 05/22/21 patient last smoked marijuana yeterday 6-13-2024una on 05/21/2021)   Sexual activity: Yes    No Known Allergies   Outpatient Medications Prior to Visit  Medication Sig Dispense Refill   acetaminophen (TYLENOL) 500 MG tablet Take 500-1,000 mg by mouth every 6 (six) hours as needed (pain.).     albuterol (VENTOLIN HFA) 108 (90 Base) MCG/ACT inhaler Inhale 2 puffs into the lungs every 6 (six) hours as needed for wheezing or shortness of breath. 8 g 2   aspirin EC 81 MG tablet Take 81 mg by mouth in the morning.     atorvastatin (LIPITOR) 10 MG tablet TAKE ONE TABLET ONCE DAILY BY MOUTH MONDAY, WEDNESDAY AND FRIDAY WTIH EVENING MEAL 36 tablet 2   Azilsartan-Chlorthalidone (EDARBYCLOR) 40-25 MG TABS  TAKE ONE TABLET DAILY IN THE MORNING 90 tablet 3   ciprofloxacin-dexamethasone (CIPRODEX) OTIC suspension Place 4 drops into the left ear 2 (two) times daily. 7.5 mL 0   Omega-3 Fatty Acids (FISH OIL PO) Take 1 capsule by mouth in the morning.     potassium chloride SA (KLOR-CON M) 20 MEQ tablet TAKE 1&1/2 tabS by MOUTH daily 135 tablet 2   spironolactone (ALDACTONE) 25 MG tablet TAKE 1 TABLET BY MOUTH IN THE  MORNING 100 tablet 2   triamcinolone ointment (KENALOG) 0.1 % Apply 1 application  topically 2 (two) times daily as needed (eczema).     verapamil (CALAN-SR) 240 MG CR tablet TAKE 1 TABLET BY MOUTH TWICE  DAILY 200 tablet 2   VITAMIN D PO Take 1 capsule by mouth in the morning.     tadalafil (CIALIS) 5 MG tablet One tab po qd (Patient not taking: Reported on  12/31/2022) 90 tablet 0   No facility-administered medications prior to visit.    Review of Systems  Constitutional:  Negative for chills, diaphoresis, fever, malaise/fatigue and weight loss.  HENT:  Negative for congestion.   Respiratory:  Positive for shortness of breath. Negative for cough, hemoptysis, sputum production and wheezing.   Cardiovascular:  Negative for chest pain, palpitations and leg swelling.     Objective:   Vitals:   01/28/23 1306  BP: 120/62  Pulse: 77  Temp: 99.7 F (37.6 C)  TempSrc: Oral  SpO2: 98%  Weight: 245 lb 3.2 oz (111.2 kg)  Height: 6\' 4"  (1.93 m)   SpO2: 98 % O2 Device: None (Room air)  Physical Exam: General: Well-appearing, no acute distress HENT: [place], AT Eyes: EOMI, no scleral icterus Respiratory: Clear to auscultation bilaterally.  No crackles, wheezing or rales Cardiovascular: RRR, -M/R/G, no JVD Extremities:-Edema,-tenderness Neuro: AAO x4, CNII-XII grossly intact Psych: Normal mood, normal affect   Data Reviewed:  Imaging: CT Chest Lung Screen 10/14/21 - Centrilobular emphysema. Respiratory bronchiolitis. 1 cm ground glass nodule in LUL. CT Chest Lung Screen 10/15/22 - Centrilobular and paraseptal emphysema. Stable 1 cm GG opacity in LUL. Tiny nodules subcentimeter nodules present   PFT: 01/28/23 FVC 3.92 (75%) FEV1 2.99 (78%) Ratio 69 TLC 78% DLCO 86% Interpretation: Mild restrictive defect. Normal DLCO  Labs: CBC    Component Value Date/Time   WBC 6.5 08/04/2022 1016   RBC 4.37 08/04/2022 1016   HGB 13.8 08/04/2022 1016   HCT 39.7 08/04/2022 1016   PLT 311 08/04/2022 1016   MCV 91 08/04/2022 1016   MCH 31.6 08/04/2022 1016   MCHC 34.8 08/04/2022 1016   RDW 12.7 08/04/2022 1016   Normal blood counts     Assessment & Plan:   Discussion: 72 year old male active smoker with COPD, OSA on CPAP, HTN, HLD, hx stroke in 2003 with residual deficits who presents for follow-up. DOE with no limitation. Not using PRN SABA.  Not interested in maintenance bronchodilators. Discussed clinical course and management of COPD including bronchodilator regimen and action plan for exacerbation.  Emphysema on CT Mild restrictive defect, normal DLCO - borderline, may be effort related --CONTINUE Albuterol AS NEEDED for shortness of breath --Reviewed pulmonary function tests. No obstructive defect so maintenance inhalers not indicated unless you become more symptomatic  LUL groundglass nodule, 1 cm - stable x 1 year --Enrolled  Tobacco abuse Patient is an active smoker. Intermittently smoking We discussed smoking cessation for <3 minutes.   Health Maintenance Immunization History  Administered Date(s) Administered  PFIZER(Purple Top)SARS-COV-2 Vaccination 08/22/2019, 09/12/2019, 05/07/2020, 12/20/2020   Pfizer Covid-19 Vaccine Bivalent Booster 5y-11y 06/23/2021   Tdap 02/18/2017   CT Lung Screen- Due for 10/2023  No orders of the defined types were placed in this encounter.  No orders of the defined types were placed in this encounter.   Return if symptoms worsen or fail to improve.  I have spent a total time of 30-minutes on the day of the appointment including chart review, data review, collecting history, coordinating care and discussing medical diagnosis and plan with the patient/family. Past medical history, allergies, medications were reviewed. Pertinent imaging, labs and tests included in this note have been reviewed and interpreted independently by me.  Marget Outten Mechele Collin, MD [facility] 01/28/2023 1:29 PM  Office Number [phone]

## 2023-01-28 NOTE — Patient Instructions (Signed)
Full PFT performed today. °

## 2023-01-28 NOTE — Patient Instructions (Signed)
Emphysema on CT Mild restrictive defect, normal DLCO - borderline, may be effort related --CONTINUE Albuterol AS NEEDED for shortness of breath --Reviewed pulmonary function tests. No obstructive defect so maintenance inhalers not indicated unless you become more symptomatic  LUL groundglass nodule, 1 cm - stable --Enrolled  Tobacco abuse Patient is an active smoker. Intermittently smoking We discussed smoking cessation for <3 minutes.

## 2023-02-05 ENCOUNTER — Ambulatory Visit (HOSPITAL_BASED_OUTPATIENT_CLINIC_OR_DEPARTMENT_OTHER): Payer: Medicare Other | Admitting: Anesthesiology

## 2023-02-05 ENCOUNTER — Ambulatory Visit (HOSPITAL_BASED_OUTPATIENT_CLINIC_OR_DEPARTMENT_OTHER)
Admission: RE | Admit: 2023-02-05 | Discharge: 2023-02-05 | Disposition: A | Discharge status: Home / Self Care | Payer: Medicare Other | Attending: Urology | Admitting: Urology

## 2023-02-05 ENCOUNTER — Encounter (HOSPITAL_BASED_OUTPATIENT_CLINIC_OR_DEPARTMENT_OTHER)
Admission: RE | Disposition: A | Discharge status: Home / Self Care | Payer: Self-pay | Source: Home / Self Care | Attending: Urology

## 2023-02-05 ENCOUNTER — Other Ambulatory Visit: Payer: Self-pay

## 2023-02-05 ENCOUNTER — Encounter (HOSPITAL_BASED_OUTPATIENT_CLINIC_OR_DEPARTMENT_OTHER): Payer: Self-pay | Admitting: Urology

## 2023-02-05 DIAGNOSIS — J449 Chronic obstructive pulmonary disease, unspecified: Secondary | ICD-10-CM | POA: Diagnosis not present

## 2023-02-05 DIAGNOSIS — I509 Heart failure, unspecified: Secondary | ICD-10-CM

## 2023-02-05 DIAGNOSIS — F17210 Nicotine dependence, cigarettes, uncomplicated: Secondary | ICD-10-CM

## 2023-02-05 DIAGNOSIS — E785 Hyperlipidemia, unspecified: Secondary | ICD-10-CM | POA: Diagnosis not present

## 2023-02-05 DIAGNOSIS — I10 Essential (primary) hypertension: Secondary | ICD-10-CM | POA: Diagnosis not present

## 2023-02-05 DIAGNOSIS — I2510 Atherosclerotic heart disease of native coronary artery without angina pectoris: Secondary | ICD-10-CM | POA: Diagnosis not present

## 2023-02-05 DIAGNOSIS — N433 Hydrocele, unspecified: Secondary | ICD-10-CM | POA: Diagnosis not present

## 2023-02-05 DIAGNOSIS — F17200 Nicotine dependence, unspecified, uncomplicated: Secondary | ICD-10-CM | POA: Insufficient documentation

## 2023-02-05 DIAGNOSIS — E119 Type 2 diabetes mellitus without complications: Secondary | ICD-10-CM | POA: Insufficient documentation

## 2023-02-05 DIAGNOSIS — G4733 Obstructive sleep apnea (adult) (pediatric): Secondary | ICD-10-CM | POA: Diagnosis not present

## 2023-02-05 DIAGNOSIS — K219 Gastro-esophageal reflux disease without esophagitis: Secondary | ICD-10-CM | POA: Insufficient documentation

## 2023-02-05 DIAGNOSIS — Z01818 Encounter for other preprocedural examination: Secondary | ICD-10-CM

## 2023-02-05 DIAGNOSIS — I110 Hypertensive heart disease with heart failure: Secondary | ICD-10-CM | POA: Diagnosis not present

## 2023-02-05 HISTORY — DX: Chronic obstructive pulmonary disease, unspecified: J44.9

## 2023-02-05 HISTORY — PX: HYDROCELE EXCISION: SHX482

## 2023-02-05 HISTORY — DX: Presence of dental prosthetic device (complete) (partial): Z97.2

## 2023-02-05 HISTORY — PX: ORCHIOPEXY: SHX479

## 2023-02-05 LAB — POCT I-STAT, CHEM 8
BUN: 16 mg/dL (ref 8–23)
Calcium, Ion: 1.28 mmol/L (ref 1.15–1.40)
Chloride: 103 mmol/L (ref 98–111)
Creatinine, Ser: 1 mg/dL (ref 0.61–1.24)
Glucose, Bld: 103 mg/dL — ABNORMAL HIGH (ref 70–99)
HCT: 44 % (ref 39.0–52.0)
Hemoglobin: 15 g/dL (ref 13.0–17.0)
Potassium: 3.3 mmol/L — ABNORMAL LOW (ref 3.5–5.1)
Sodium: 139 mmol/L (ref 135–145)
TCO2: 25 mmol/L (ref 22–32)

## 2023-02-05 SURGERY — HYDROCELECTOMY
Anesthesia: General | Site: Scrotum | Laterality: Right

## 2023-02-05 MED ORDER — CLINDAMYCIN PHOSPHATE 900 MG/50ML IV SOLN
900.0000 mg | INTRAVENOUS | Status: AC
  Administered 2023-02-05: 900 mg via INTRAVENOUS

## 2023-02-05 MED ORDER — ONDANSETRON HCL 4 MG/2ML IJ SOLN
INTRAMUSCULAR | Status: DC | PRN
  Administered 2023-02-05: 4 mg via INTRAVENOUS

## 2023-02-05 MED ORDER — MIDAZOLAM HCL 2 MG/2ML IJ SOLN
INTRAMUSCULAR | Status: AC
  Filled 2023-02-05: qty 2

## 2023-02-05 MED ORDER — MIDAZOLAM HCL 5 MG/5ML IJ SOLN
INTRAMUSCULAR | Status: DC | PRN
  Administered 2023-02-05: 1 mg via INTRAVENOUS

## 2023-02-05 MED ORDER — FENTANYL CITRATE (PF) 100 MCG/2ML IJ SOLN: 25.0000 ug | INTRAMUSCULAR | Status: DC | PRN

## 2023-02-05 MED ORDER — SENNOSIDES-DOCUSATE SODIUM 8.6-50 MG PO TABS: 1.0000 | ORAL_TABLET | Freq: Two times a day (BID) | ORAL | 0 refills | Status: DC

## 2023-02-05 MED ORDER — FENTANYL CITRATE (PF) 100 MCG/2ML IJ SOLN
INTRAMUSCULAR | Status: AC
  Filled 2023-02-05: qty 2

## 2023-02-05 MED ORDER — DEXAMETHASONE SODIUM PHOSPHATE 10 MG/ML IJ SOLN
INTRAMUSCULAR | Status: DC | PRN
  Administered 2023-02-05: 5 mg via INTRAVENOUS

## 2023-02-05 MED ORDER — PHENYLEPHRINE 80 MCG/ML (10ML) SYRINGE FOR IV PUSH (FOR BLOOD PRESSURE SUPPORT)
PREFILLED_SYRINGE | INTRAVENOUS | Status: DC | PRN
  Administered 2023-02-05: 160 ug via INTRAVENOUS

## 2023-02-05 MED ORDER — 0.9 % SODIUM CHLORIDE (POUR BTL) OPTIME
TOPICAL | Status: DC | PRN
  Administered 2023-02-05: 500 mL

## 2023-02-05 MED ORDER — FENTANYL CITRATE (PF) 100 MCG/2ML IJ SOLN
INTRAMUSCULAR | Status: DC | PRN
  Administered 2023-02-05 (×4): 50 ug via INTRAVENOUS

## 2023-02-05 MED ORDER — LIDOCAINE HCL (PF) 2 % IJ SOLN
INTRAMUSCULAR | Status: AC
  Filled 2023-02-05: qty 5

## 2023-02-05 MED ORDER — ONDANSETRON HCL 4 MG/2ML IJ SOLN
INTRAMUSCULAR | Status: AC
  Filled 2023-02-05: qty 2

## 2023-02-05 MED ORDER — PROPOFOL 10 MG/ML IV BOLUS
INTRAVENOUS | Status: AC
  Filled 2023-02-05: qty 20

## 2023-02-05 MED ORDER — PROPOFOL 10 MG/ML IV BOLUS
INTRAVENOUS | Status: DC | PRN
  Administered 2023-02-05: 50 mg via INTRAVENOUS
  Administered 2023-02-05: 70 mg via INTRAVENOUS
  Administered 2023-02-05: 250 mg via INTRAVENOUS

## 2023-02-05 MED ORDER — CLINDAMYCIN PHOSPHATE 900 MG/50ML IV SOLN
INTRAVENOUS | Status: AC
  Filled 2023-02-05: qty 50

## 2023-02-05 MED ORDER — ONDANSETRON HCL 4 MG/2ML IJ SOLN: 4.0000 mg | Freq: Once | INTRAMUSCULAR | Status: DC | PRN

## 2023-02-05 MED ORDER — DEXAMETHASONE SODIUM PHOSPHATE 10 MG/ML IJ SOLN
INTRAMUSCULAR | Status: AC
  Filled 2023-02-05: qty 1

## 2023-02-05 MED ORDER — ACETAMINOPHEN 500 MG PO TABS
ORAL_TABLET | ORAL | Status: AC
  Filled 2023-02-05: qty 2

## 2023-02-05 MED ORDER — LIDOCAINE 2% (20 MG/ML) 5 ML SYRINGE
INTRAMUSCULAR | Status: DC | PRN
  Administered 2023-02-05: 100 mg via INTRAVENOUS

## 2023-02-05 MED ORDER — ACETAMINOPHEN 500 MG PO TABS
1000.0000 mg | ORAL_TABLET | Freq: Once | ORAL | Status: AC
  Administered 2023-02-05: 1000 mg via ORAL

## 2023-02-05 MED ORDER — OXYCODONE-ACETAMINOPHEN 5-325 MG PO TABS: 1.0000 | ORAL_TABLET | Freq: Four times a day (QID) | ORAL | 0 refills | Status: DC | PRN

## 2023-02-05 MED ORDER — LACTATED RINGERS IV SOLN: INTRAVENOUS | Status: DC

## 2023-02-05 MED ORDER — BUPIVACAINE HCL (PF) 0.25 % IJ SOLN
INTRAMUSCULAR | Status: DC | PRN
  Administered 2023-02-05: 20 mL

## 2023-02-05 SURGICAL SUPPLY — 45 items
ADH SKN CLS APL DERMABOND .7 (GAUZE/BANDAGES/DRESSINGS)
BLADE CLIPPER SENSICLIP SURGIC (BLADE) ×2 IMPLANT
BLADE SURG 15 STRL LF DISP TIS (BLADE) ×2 IMPLANT
BLADE SURG 15 STRL SS (BLADE) ×1
BNDG GAUZE DERMACEA FLUFF 4 (GAUZE/BANDAGES/DRESSINGS) ×2 IMPLANT
BNDG GZE DERMACEA 4 6PLY (GAUZE/BANDAGES/DRESSINGS) ×1
BRIEF MESH DISP LRG (UNDERPADS AND DIAPERS) ×2 IMPLANT
COVER BACK TABLE 60X90IN (DRAPES) ×2 IMPLANT
COVER MAYO STAND STRL (DRAPES) ×2 IMPLANT
DERMABOND ADVANCED .7 DNX12 (GAUZE/BANDAGES/DRESSINGS) IMPLANT
DRAIN PENROSE 0.5X18 (DRAIN) IMPLANT
DRAPE LAPAROTOMY 100X72 PEDS (DRAPES) ×2 IMPLANT
ELECT REM PT RETURN 9FT ADLT (ELECTROSURGICAL) ×1
ELECTRODE REM PT RTRN 9FT ADLT (ELECTROSURGICAL) ×2 IMPLANT
GAUZE 4X4 16PLY ~~LOC~~+RFID DBL (SPONGE) ×2 IMPLANT
GAUZE SPONGE 4X4 12PLY STRL (GAUZE/BANDAGES/DRESSINGS) IMPLANT
GLOVE BIO SURGEON STRL SZ 6 (GLOVE) IMPLANT
GLOVE BIO SURGEON STRL SZ7.5 (GLOVE) ×2 IMPLANT
GLOVE BIOGEL PI IND STRL 6 (GLOVE) IMPLANT
GLOVE BIOGEL PI IND STRL 7.0 (GLOVE) IMPLANT
GLOVE SURG SS PI 6.5 STRL IVOR (GLOVE) IMPLANT
GOWN STRL REUS W/TWL LRG LVL3 (GOWN DISPOSABLE) ×2 IMPLANT
GOWN STRL REUS W/TWL XL LVL3 (GOWN DISPOSABLE) IMPLANT
KIT TURNOVER CYSTO (KITS) ×2 IMPLANT
NDL HYPO 25X1 1.5 SAFETY (NEEDLE) ×2 IMPLANT
NEEDLE HYPO 25X1 1.5 SAFETY (NEEDLE) ×1 IMPLANT
NS IRRIG 500ML POUR BTL (IV SOLUTION) IMPLANT
PACK BASIN DAY SURGERY FS (CUSTOM PROCEDURE TRAY) ×2 IMPLANT
PENCIL SMOKE EVACUATOR (MISCELLANEOUS) ×2 IMPLANT
SLEEVE SCD COMPRESS KNEE MED (STOCKING) ×2 IMPLANT
SUT ETHILON 4 0 PS 2 18 (SUTURE) IMPLANT
SUT MNCRL AB 4-0 PS2 18 (SUTURE) ×2 IMPLANT
SUT PROLENE 4 0 PS 2 18 (SUTURE) ×2 IMPLANT
SUT VIC AB 2-0 SH 27 (SUTURE) ×1
SUT VIC AB 2-0 SH 27XBRD (SUTURE) IMPLANT
SUT VIC AB 3-0 SH 27 (SUTURE) ×2
SUT VIC AB 3-0 SH 27X BRD (SUTURE) IMPLANT
SUT VIC AB 3-0 SH 27XBRD (SUTURE) ×2 IMPLANT
SUT VIC AB 4-0 SH 27 (SUTURE) ×1
SUT VIC AB 4-0 SH 27XANBCTRL (SUTURE) ×2 IMPLANT
SYR CONTROL 10ML LL (SYRINGE) ×2 IMPLANT
TOWEL OR 17X24 6PK STRL BLUE (TOWEL DISPOSABLE) ×2 IMPLANT
TRAY DSU PREP LF (CUSTOM PROCEDURE TRAY) ×2 IMPLANT
TUBE CONNECTING 12X1/4 (SUCTIONS) ×2 IMPLANT
YANKAUER SUCT BULB TIP NO VENT (SUCTIONS) ×2 IMPLANT

## 2023-02-05 NOTE — Progress Notes (Signed)
Alert and Oriented, VSS, pain status minimal, MAE x 4, tolerated POs well. Copy of AVS reviewed with client and wife.

## 2023-02-05 NOTE — H&P (Signed)
Terry Andrews is an 72 y.o. male.    Chief Complaint: Pre-OP RIGHT Hydrocelectomy / Orchidopexy  HPI:   1 -  Right Hydrocele - about volume soft likely Rt hydrocele on exam 11/2019. NO palpable hernias. Some slow progression with time atbou 400 mL 2024 and becoming more botherseom.   4 - Erectile Dysfunction - progressive, may want to try oral agent after hydrocele resolved.   PMH sig for pre-DM2 (A1c 6), OSA/CPAP, HTN, mild CVA (no deficits or chronic blood thinners). No prior surgeries. He is retired from Occupational hygienist. Has several grandkids and great grandkids in the area. His PCP is Terry Peng MD   Today "Terry Andrews" is seen to proceed with RIGHT hydrocelectomy / orchidopexy for impressive but simple Rt hydrocele.   Past Medical History:  Diagnosis Date   COPD (chronic obstructive pulmonary disease) (HCC)    Diabetes mellitus without complication (HCC) type 2    GERD (gastroesophageal reflux disease)    H/O: CVA (cerebrovascular accident) 2003   Patient states the only residual deficit is an occasional stutter.   Hyperlipidemia    Hypertension    OSA (obstructive sleep apnea)    wears CPAP q night   Wears partial denture upper     Past Surgical History:  Procedure Laterality Date   ANAL FISTULOTOMY N/A 05/29/2021   Procedure: WIDE EXCISION OF HYDRADINITIS;  Surgeon: Romie Levee, MD;  Location: [facility];  Service: General;  Laterality: N/A;   COLONOSCOPY  12/21/2019   LEFT HEART CATH AND CORONARY ANGIOGRAPHY N/A 08/14/2022   Procedure: LEFT HEART CATH AND CORONARY ANGIOGRAPHY;  Surgeon: Yates Decamp, MD;  Location: [facility];  Service: Cardiovascular;  Laterality: N/A;    Family History  Problem Relation Age of Onset   Cancer Mother    Stroke Father    Heart disease Father    Social History:  reports that he has been smoking cigarettes. He started smoking about 59 years ago. He has a 26.00 pack-year smoking history. He has never used  smokeless tobacco. He reports current alcohol use. He reports current drug use. Drug: Marijuana.  Allergies: No Known Allergies  No medications prior to admission.    No results found for this or any previous visit (from the past 48 hour(s)). No results found.  Review of Systems  Constitutional:  Negative for chills and fever.  All other systems reviewed and are negative.   Height 6\' 3"  (1.905 m), weight 113.4 kg. Physical Exam Vitals reviewed.  HENT:     Head: Normocephalic.     Mouth/Throat:     Mouth: Mucous membranes are moist.  Eyes:     Pupils: Pupils are equal, round, and reactive to light.  Cardiovascular:     Rate and Rhythm: Normal rate.  Pulmonary:     Effort: Pulmonary effort is normal.  Abdominal:     General: Abdomen is flat.  Genitourinary:    Comments: Stable very large hydrocele with mass effect on penis and groin.  Musculoskeletal:        General: Normal range of motion.     Cervical back: Normal range of motion.  Skin:    General: Skin is warm.  Neurological:     General: No focal deficit present.     Mental Status: He is alert.      Assessment/Plan  Proceed as planned with RIGHT hydrocelectomy / orchidopexy as planned. Risks, benefits, alternatives, expected peri-op course discussed previously and reiterated today.  Loletta Parish., MD 02/05/2023, 7:34 AM

## 2023-02-05 NOTE — Brief Op Note (Signed)
02/05/2023  2:06 PM  PATIENT:  Terry Andrews  72 y.o. male  PRE-OPERATIVE DIAGNOSIS:  LARGE RIGHT HYDROCELE  POST-OPERATIVE DIAGNOSIS:  LARGE RIGHT HYDROCELE  PROCEDURE:  Procedure(s): OPEN HYDROCELECTOMY ADULT (Right) ORCHIOPEXY ADULT (Right)  SURGEON:  Surgeon(s) and Role:    * Happy Ky, Delbert Phenix., MD - Primary  PHYSICIAN ASSISTANT:   ASSISTANTS: none   ANESTHESIA:   local and general  EBL:  20 mL   BLOOD ADMINISTERED:none  DRAINS: Penrose drain in the Rt dependant scrotum    LOCAL MEDICATIONS USED:  MARCAINE     SPECIMEN:  Source of Specimen:  Rt hydrocele sac with redundant skin  DISPOSITION OF SPECIMEN:   discard  COUNTS:  YES  TOURNIQUET:  * No tourniquets in log *  DICTATION: .Other Dictation: Dictation Number [id number]  PLAN OF CARE: Discharge to home after PACU  PATIENT DISPOSITION:  PACU - hemodynamically stable.   Delay start of Pharmacological VTE agent (>24hrs) due to surgical blood loss or risk of bleeding: yes

## 2023-02-05 NOTE — Anesthesia Preprocedure Evaluation (Addendum)
Anesthesia Evaluation  Patient identified by MRN, date of birth, ID band Patient awake    Reviewed: Allergy & Precautions, NPO status , Patient's Chart, lab work & pertinent test results  Airway Mallampati: II  TM Distance: >3 FB Neck ROM: Full    Dental  (+) Dental Advisory Given, Missing, Chipped, Poor Dentition,    Pulmonary sleep apnea and Continuous Positive Airway Pressure Ventilation , COPD, Current Smoker   Pulmonary exam normal breath sounds clear to auscultation       Cardiovascular hypertension, Pt. on medications + CAD  Normal cardiovascular exam Rhythm:Regular Rate:Normal     Neuro/Psych CVA, Residual Symptoms  negative psych ROS   GI/Hepatic Neg liver ROS,GERD  ,,  Endo/Other  diabetes, Type 2    Renal/GU negative Renal ROS   LARGE RIGHT HYDROCELE    Musculoskeletal negative musculoskeletal ROS (+)    Abdominal   Peds  Hematology negative hematology ROS (+)   Anesthesia Other Findings Day of surgery medications reviewed with the patient.  Reproductive/Obstetrics                             Anesthesia Physical Anesthesia Plan  ASA: 3  Anesthesia Plan: General   Post-op Pain Management: Tylenol PO (pre-op)* and Toradol IV (intra-op)*   Induction: Intravenous  PONV Risk Score and Plan: 2 and Dexamethasone and Ondansetron  Airway Management Planned: LMA  Additional Equipment:   Intra-op Plan:   Post-operative Plan: Extubation in OR  Informed Consent: I have reviewed the patients History and Physical, chart, labs and discussed the procedure including the risks, benefits and alternatives for the proposed anesthesia with the patient or authorized representative who has indicated his/her understanding and acceptance.     Dental advisory given  Plan Discussed with: CRNA  Anesthesia Plan Comments:         Anesthesia Quick Evaluation

## 2023-02-05 NOTE — Transfer of Care (Signed)
Immediate Anesthesia Transfer of Care Note  Patient: Terry Andrews  Procedure(s) Performed: OPEN HYDROCELECTOMY ADULT (Right: Scrotum) ORCHIOPEXY ADULT (Right: Scrotum)  Patient Location: PACU  Anesthesia Type:General  Level of Consciousness: awake, alert , and oriented  Airway & Oxygen Therapy: Patient Spontanous Breathing and Patient connected to nasal cannula oxygen  Post-op Assessment: Report given to RN and Post -op Vital signs reviewed and stable  Post vital signs: Reviewed and stable  Last Vitals:  Vitals Value Taken Time  BP 125/66   Temp    Pulse 74   Resp 17   SpO2 100     Last Pain:  Vitals:   02/05/23 1139  TempSrc: Oral  PainSc: 0-No pain      Patients Stated Pain Goal: 5 (02/05/23 1139)  Complications: No notable events documented.

## 2023-02-05 NOTE — Op Note (Unsigned)
Terry Andrews, Terry Andrews MEDICAL RECORD NO: [id number] ACCOUNT NO: [id number] DATE OF BIRTH: [birth year] FACILITY: [facility] LOCATION: [facility] PHYSICIAN: Sebastian Ache, MD  Operative Report   DATE OF PROCEDURE: 02/05/2023  PREOPERATIVE DIAGNOSIS:  Large right hydrocele.  POSTOPERATIVE DIAGNOSIS:  Large right hydrocele.  PROCEDURE: 1.  Right hydrocelectomy. 2.  Right orchiopexy.  ESTIMATED BLOOD LOSS:  20 mL  COMPLICATIONS:  None.  SPECIMEN:  Right hydrocele sac with redundant skin for discard.  FINDINGS: 1.  475 mL simple right hydrocele with significant mass effect on the scrotum and penis. 2.  Excessive right testicular mobility requiring orchiopexy.  DRAINS:  Penrose drain to dependent scrotal drainage.  INDICATIONS:  The patient is a pleasant 72 year old man with a multiyear history of a right hydrocele.  It was not expanding for at least 3 years.  He initially was not bothered but over time, as the size became larger he became increasingly bothered and  is interfering with activities.  Also, causing mass effect on his penis.  Options were discussed for further management including continued observation versus aspiration and sclerotherapy versus definitive management with hydrocelectomy and he wished to  proceed with the latter given the very large size of the hydrocele I counseled him towards orchiopexy as well.  He wished to proceed.  Informed consent was obtained and placed in medical record.  PROCEDURE IN DETAIL:  The patient being identified and verified, procedure being right hydrocelectomy with orchidopexy was confirmed.  Procedure timeout was performed.  Intravenous antibiotics were administered.  General anesthesia was induced.  The  patient was placed into a supine position.  Sterile field was created by first clipper shaving then prepping and draping the patient's right hemiscrotum penis, perineum, and proximal thighs using iodine.  Next, incision was made in elliptical  form in the  right scrotal compartment with long axis of the ellipse in anterior, posterior direction, encompassing a large amount of redundant scrotal skin.  Total surface area skin removal was at least 10 cm2. An elliptical incision was carried down through the  right scrotal layers and the large hydrocele sac was found.  It was separated from underlying and surrounding scarified connecting tissue and released from this and completely delivered such that the right testis and hydrocele with redundant skin just  was tethered by the cord structures.  Hydrocele sac was purposely entered in the dependent portion drained of 475 mL of straw-colored simple fluid.  The hydrocele sac was further incised.  The testis was delivered.  It was grossly unremarkable and the  hydrocele sac was purposely transected keeping approximately small 10% rim with the testis and cord.  The hydrocele sac for skin en bloc was carefully inspected and was grossly unremarkable and set aside for discard.  Next, the edges of the everted  hydrocele sac were reapproximated using running 3-0 Vicryl.  The testis was unremarkable, but it did have significant mobility as anticipated.  The testis was lying back into the scrotum with the lateral sulcus lateral and 2 pexy sutures were placed from  the inner lateral scrotal skin leaflet to the mesorchium testis laterally, this was performed x2 and great care was taken to ensure there was no twisting of the cord.  Additional hemostasis was achieved with point coagulation current.  There was no  evidence of any associated hernia.  A closed right Penrose drain half inch type was brought through the dependent scrotum via a small counter incision, sutured in place using nylon drain stitch.  The dartos  was reapproximated using #2 running suture  lines of 3-0 Vicryl meeting in the center and the skin was reapproximated using similarly 2 running suture lines of 4-0 Monocryl meeting in the center.  This  resulted in excellent skin reapproximation and hemostasis was quite good.  Approximately 20 mL  of 0.25% plain Marcaine was instilled along the area of the incision with additional 10 mL in a cord block type fashion at the level of the external ring.  A dressing of ABD pad and mesh underwear was placed.  The procedure was then terminated.  The  patient tolerated procedure well, no immediate periprocedural complications.  The patient was taken to postanesthesia care unit in stable condition.  Plan for discharge home.   PUS D: 02/05/2023 2:14:03 pm T: 02/05/2023 3:56:00 pm  JOB: [id number]/ [id number]

## 2023-02-05 NOTE — Discharge Instructions (Addendum)
1 - You may have bloody spotting and drainage from area of drain for up to 10 days. This is normal. OK to shower anytime. Avoid tub bathing / swimming x 2 weeks. Avoid heavy lifting or straddle activity (bicyle, etc..) x 2 weeks.   2 - Call MD or go to ER for fever >102, severe pain / nausea / vomiting not relieved by medications, or acute change in medical status   Follow up with your Surgeon on Monday, July 8th at the [facility] Office for a Post Operative Follow up

## 2023-02-05 NOTE — Anesthesia Procedure Notes (Signed)
Procedure Name: LMA Insertion Date/Time: 02/05/2023 1:13 PM  Performed by: Briant Sites, CRNAPre-anesthesia Checklist: Patient identified, Emergency Drugs available, Suction available and Patient being monitored Patient Re-evaluated:Patient Re-evaluated prior to induction Oxygen Delivery Method: Circle system utilized Preoxygenation: Pre-oxygenation with 100% oxygen Induction Type: IV induction Ventilation: Mask ventilation without difficulty LMA: LMA inserted LMA Size: 5.0 Number of attempts: 1 Airway Equipment and Method: Bite block Placement Confirmation: positive ETCO2 Tube secured with: Tape Dental Injury: Teeth and Oropharynx as per pre-operative assessment

## 2023-02-08 ENCOUNTER — Encounter (HOSPITAL_BASED_OUTPATIENT_CLINIC_OR_DEPARTMENT_OTHER): Payer: Self-pay | Admitting: Urology

## 2023-02-08 DIAGNOSIS — N281 Cyst of kidney, acquired: Secondary | ICD-10-CM | POA: Diagnosis not present

## 2023-02-08 NOTE — Anesthesia Postprocedure Evaluation (Signed)
Anesthesia Post Note  Patient: EYAN GUSTAVESON  Procedure(s) Performed: OPEN HYDROCELECTOMY ADULT (Right: Scrotum) ORCHIOPEXY ADULT (Right: Scrotum)     Patient location during evaluation: PACU Anesthesia Type: General Level of consciousness: awake and alert Pain management: pain level controlled Vital Signs Assessment: post-procedure vital signs reviewed and stable Respiratory status: spontaneous breathing, nonlabored ventilation, respiratory function stable and patient connected to nasal cannula oxygen Cardiovascular status: blood pressure returned to baseline and stable Postop Assessment: no apparent nausea or vomiting Anesthetic complications: no   No notable events documented.  Last Vitals:  Vitals:   02/05/23 1500 02/05/23 1526  BP:  (!) 144/80  Pulse:  64  Resp:  18  Temp: 36.6 C 36.6 C  SpO2: 95% 99%    Last Pain:  Vitals:   02/05/23 1526  TempSrc: Oral  PainSc: 2                  Collene Schlichter

## 2023-02-25 ENCOUNTER — Ambulatory Visit (INDEPENDENT_AMBULATORY_CARE_PROVIDER_SITE_OTHER): Payer: Medicare Other

## 2023-02-25 DIAGNOSIS — Z0000 Encounter for general adult medical examination without abnormal findings: Secondary | ICD-10-CM | POA: Diagnosis not present

## 2023-02-25 NOTE — Progress Notes (Signed)
Subjective:   Terry Andrews is a 72 y.o. male who presents for Medicare Annual/Subsequent preventive examination.  Visit Complete: In person  Vital Signs: Unable to obtain new vitals due to this being a telehealth visit.  Review of Systems     Cardiac Risk Factors include: advanced age (>52men, >6 women);smoking/ tobacco exposure;hypertension;male gender;obesity (BMI >30kg/m2)     Objective:    Today's Vitals   There is no height or weight on file to calculate BMI.     02/25/2023    2:51 PM 02/05/2023   11:37 AM 08/14/2022   10:46 AM 02/19/2022    9:43 AM 05/29/2021    5:45 AM 01/16/2021    8:32 AM 01/11/2020    9:09 AM  Advanced Directives  Does Patient Have a Medical Advance Directive? No Yes No No No No Yes  Type of Furniture conservator/restorer;Living will     Healthcare Power of [place];Living will  Copy of Healthcare Power of Attorney in Chart?  No - copy requested     Yes - validated most recent copy scanned in chart (See row information)  Would patient like information on creating a medical advance directive?   No - Patient declined  No - Patient declined      Current Medications (verified) Outpatient Encounter Medications as of 02/25/2023  Medication Sig   albuterol (VENTOLIN HFA) 108 (90 Base) MCG/ACT inhaler Inhale 2 puffs into the lungs every 6 (six) hours as needed for wheezing or shortness of breath.   aspirin EC 81 MG tablet Take 81 mg by mouth in the morning.   atorvastatin (LIPITOR) 10 MG tablet TAKE ONE TABLET ONCE DAILY BY MOUTH MONDAY, WEDNESDAY AND FRIDAY WTIH EVENING MEAL   Azilsartan-Chlorthalidone (EDARBYCLOR) 40-25 MG TABS TAKE ONE TABLET DAILY IN THE MORNING   ciprofloxacin-dexamethasone (CIPRODEX) OTIC suspension Place 4 drops into the left ear 2 (two) times daily.   Omega-3 Fatty Acids (FISH OIL PO) Take 1 capsule by mouth in the morning.   potassium chloride SA (KLOR-CON M) 20 MEQ tablet TAKE 1&1/2 tabS by MOUTH daily    spironolactone (ALDACTONE) 25 MG tablet TAKE 1 TABLET BY MOUTH IN THE  MORNING   triamcinolone ointment (KENALOG) 0.1 % Apply 1 application  topically 2 (two) times daily as needed (eczema).   verapamil (CALAN-SR) 240 MG CR tablet TAKE 1 TABLET BY MOUTH TWICE  DAILY   VITAMIN D PO Take 1 capsule by mouth in the morning.   oxyCODONE-acetaminophen (PERCOCET) 5-325 MG tablet Take 1 tablet by mouth every 6 (six) hours as needed for severe pain or moderate pain (post-operatively). (Patient not taking: Reported on 02/25/2023)   senna-docusate (SENOKOT-S) 8.6-50 MG tablet Take 1 tablet by mouth 2 (two) times daily. While taking strong pain meds to prevent constipation (Patient not taking: Reported on 02/25/2023)   sildenafil (VIAGRA) 100 MG tablet SMARTSIG:1 Tablet(s) By Mouth (Patient not taking: Reported on 02/25/2023)   tadalafil (CIALIS) 5 MG tablet One tab po qd (Patient not taking: Reported on 12/31/2022)   No facility-administered encounter medications on file as of 02/25/2023.    Allergies (verified) Patient has no known allergies.   History: Past Medical History:  Diagnosis Date   COPD (chronic obstructive pulmonary disease) (HCC)    Diabetes mellitus without complication (HCC) type 2    Per pt "Borderline"/"pre-diabetic"   GERD (gastroesophageal reflux disease)    H/O: CVA (cerebrovascular accident) 2003   Patient states the only residual deficit is an  occasional stutter.   Hyperlipidemia    Hypertension    OSA (obstructive sleep apnea)    wears CPAP q night   Wears partial denture upper    Past Surgical History:  Procedure Laterality Date   ANAL FISTULOTOMY N/A 05/29/2021   Procedure: WIDE EXCISION OF HYDRADINITIS;  Surgeon: Romie Levee, MD;  [facility];  Service: General;  Laterality: N/A;   COLONOSCOPY  12/21/2019   HYDROCELE EXCISION Right 02/05/2023   Procedure: OPEN HYDROCELECTOMY ADULT;  Surgeon: Loletta Parish., MD;  [facility];  Service: Urology;  Laterality: Right;   LEFT HEART CATH AND CORONARY ANGIOGRAPHY N/A 08/14/2022   Procedure: LEFT HEART CATH AND CORONARY ANGIOGRAPHY;  Surgeon: Yates Decamp, MD;  Location: [facility];  Service: Cardiovascular;  Laterality: N/A;   ORCHIOPEXY Right 02/05/2023   Procedure: ORCHIOPEXY ADULT;  Surgeon: Loletta Parish., MD;  Location: [facility];  Service: Urology;  Laterality: Right;   Family History  Problem Relation Age of Onset   Cancer Mother    Stroke Father    Heart disease Father    Social History   Socioeconomic History   Marital status: Single    Spouse name: Not on file   Number of children: 2   Years of education: College   Highest education level: Not on file  Occupational History   Occupation: retired  Tobacco Use   Smoking status: Every Day    Current packs/day: 0.50    Average packs/day: 0.5 packs/day for 59.6 years (29.8 ttl pk-yrs)    Types: Cigarettes    Start date: 1965   Smokeless tobacco: Never   Tobacco comments:    he has cut back number cigs/smoked,   Vaping Use   Vaping status: Never Used  Substance and Sexual Activity   Alcohol use: Not Currently    Comment: ocassionally   Drug use: Yes    Types: Marijuana    Comment: every once in a while, (as of 05/22/21 patient last smoked marijuana yeterday 6-13-2024una on 05/21/2021)   Sexual activity: Yes  Other Topics Concern   Not on file  Social History Narrative   Some caffeine use    Social Determinants of Health   Financial Resource Strain: Low Risk  (02/25/2023)   Overall Financial Resource Strain (CARDIA)    Difficulty of Paying Living Expenses: Not hard at all  Food Insecurity: No Food Insecurity (02/25/2023)   Hunger Vital Sign    Worried About Running Out of Food in the Last Year: Never true    Ran Out of Food in the Last Year: Never true  Transportation Needs: No Transportation Needs (02/25/2023)   PRAPARE - Scientist, research (physical sciences) (Medical): No    Lack of Transportation (Non-Medical): No  Physical Activity: Inactive (02/25/2023)   Exercise Vital Sign    Days of Exercise per Week: 0 days    Minutes of Exercise per Session: 0 min  Stress: No Stress Concern Present (02/25/2023)   [employer] of Occupational Health - Occupational Stress Questionnaire    Feeling of Stress : Not at all  Social Connections: Moderately Isolated (02/25/2023)   Social Connection and Isolation Panel [NHANES]    Frequency of Communication with Friends and Family: More than three times a week    Frequency of Social Gatherings with Friends and Family: Never    Attends Religious Services: Never    Database administrator or  Organizations: No    Attends Engineer, structural: Never    Marital Status: Married    Tobacco Counseling Ready to quit: Not Answered Counseling given: Not Answered Tobacco comments: he has cut back number cigs/smoked,    Clinical Intake:  Pre-visit preparation completed: Yes  Pain : No/denies pain     Nutritional Status: BMI > 30  Obese Nutritional Risks: None Diabetes: No  How often do you need to have someone help you when you read instructions, pamphlets, or other written materials from your doctor or pharmacy?: 1 - Never  Interpreter Needed?: No  Information entered by :: NAllen LPN   Activities of Daily Living    02/25/2023    2:43 PM 02/05/2023   11:48 AM  In your present state of health, do you have any difficulty performing the following activities:  Hearing? 0 0  Vision? 0 0  Difficulty concentrating or making decisions? 0 0  Walking or climbing stairs? 1 0  Dressing or bathing? 0 0  Doing errands, shopping? 0   Preparing Food and eating ? N   Using the Toilet? N   In the past six months, have you accidently leaked urine? N   Do you have problems with loss of bowel control? N   Managing your Medications? N   Managing your Finances? N   Housekeeping or managing  your Housekeeping? N     Patient Care Team: Dorothyann Peng, MD as PCP - General (Internal Medicine) Yates Decamp, MD as PCP - Cardiology (Cardiology)  Indicate any recent Medical Services you may have received from other than [facility] providers in the past year (date may be approximate).     Assessment:   This is a routine wellness examination for [employer].  Hearing/Vision screen Hearing Screening - Comments:: Denies hearing issue Vision Screening - Comments:: Regular eye exams, Dr. Sharlot Gowda  Dietary issues and exercise activities discussed:     Goals Addressed             This Visit's Progress    Patient Stated       02/25/2023, staying alive       Depression Screen    02/25/2023    2:53 PM 02/19/2022    9:44 AM 01/16/2021    8:33 AM 01/11/2020    9:09 AM 03/08/2019    9:14 AM 01/10/2019    9:05 AM 12/13/2018    8:35 AM  PHQ 2/9 Scores  PHQ - 2 Score 0 0 0 0 0 0 0  PHQ- 9 Score 0   0 0 0     Fall Risk    02/25/2023    2:53 PM 02/19/2022    9:44 AM 01/16/2021    8:33 AM 01/11/2020    9:09 AM 03/08/2019    9:14 AM  Fall Risk   Falls in the past year? 0 0 0 0 0  Number falls in past yr: 0 0     Injury with Fall? 0 0     Risk for fall due to : Medication side effect Medication side effect Medication side effect    Follow up Falls prevention discussed;Falls evaluation completed Falls evaluation completed;Education provided;Falls prevention discussed Falls evaluation completed;Education provided;Falls prevention discussed      MEDICARE RISK AT HOME:  Medicare Risk at Home - 02/25/23 1453     Any stairs in or around the home? Yes    If so, are there any without handrails? No    Home free of loose  throw rugs in walkways, pet beds, electrical cords, etc? Yes    Adequate lighting in your home to reduce risk of falls? Yes    Life alert? No    Use of a cane, walker or w/c? No    Grab bars in the bathroom? No    Shower chair or bench in shower? No    Elevated toilet seat or a  handicapped toilet? Yes             TIMED UP AND GO:  Was the test performed?  No    Cognitive Function:        02/25/2023    2:54 PM 02/19/2022    9:45 AM 01/16/2021    8:35 AM 01/11/2020    9:10 AM 01/10/2019    9:07 AM  6CIT Screen  What Year? 0 points 0 points 0 points 0 points 0 points  What month? 0 points 0 points 0 points 0 points 0 points  What time? 0 points 0 points 0 points 0 points 3 points  Count back from 20 0 points 0 points 0 points 0 points 0 points  Months in reverse 0 points 0 points 0 points 0 points 0 points  Repeat phrase 0 points 6 points 6 points 4 points 0 points  Total Score 0 points 6 points 6 points 4 points 3 points    Immunizations Immunization History  Administered Date(s) Administered   PFIZER(Purple Top)SARS-COV-2 Vaccination 08/22/2019, 09/12/2019, 05/07/2020, 12/20/2020   Pfizer Covid-19 Vaccine Bivalent Booster 5y-11y 06/23/2021   Tdap 02/18/2017    TDAP status: Up to date  Flu Vaccine status: Declined, Education has been provided regarding the importance of this vaccine but patient still declined. Advised may receive this vaccine at local pharmacy or Health Dept. Aware to provide a copy of the vaccination record if obtained from local pharmacy or Health Dept. Verbalized acceptance and understanding.  Pneumococcal vaccine status: Declined,  Education has been provided regarding the importance of this vaccine but patient still declined. Advised may receive this vaccine at local pharmacy or Health Dept. Aware to provide a copy of the vaccination record if obtained from local pharmacy or Health Dept. Verbalized acceptance and understanding.   Covid-19 vaccine status: Information provided on how to obtain vaccines.   Qualifies for Shingles Vaccine? Yes   Zostavax completed No   Shingrix Completed?: No.    Education has been provided regarding the importance of this vaccine. Patient has been advised to call insurance company to determine out  of pocket expense if they have not yet received this vaccine. Advised may also receive vaccine at local pharmacy or Health Dept. Verbalized acceptance and understanding.  Screening Tests Health Maintenance  Topic Date Due   COVID-19 Vaccine (6 - 2023-24 season) 04/03/2022   Zoster Vaccines- Shingrix (1 of 2) 04/02/2023 (Originally 06/02/2003)   Pneumonia Vaccine 71+ Years old (1 of 2 - PCV) 12/31/2023 (Originally 06/02/1957)   INFLUENZA VACCINE  03/04/2023   Lung Cancer Screening  10/19/2023   Medicare Annual Wellness (AWV)  02/25/2024   DTaP/Tdap/Td (2 - Td or Tdap) 02/19/2027   Colonoscopy  12/20/2029   Hepatitis C Screening  Completed   HPV VACCINES  Aged Out    Health Maintenance  Health Maintenance Due  Topic Date Due   COVID-19 Vaccine (6 - 2023-24 season) 04/03/2022    Colorectal cancer screening: Type of screening: Colonoscopy. Completed 12/21/2019. Repeat every 10 years  Lung Cancer Screening: (Low Dose CT Chest recommended if Age 74-80  years, 20 pack-year currently smoking OR have quit w/in 15years.) does not qualify.   Lung Cancer Screening Referral: CT scan 10/19/2022  Additional Screening:  Hepatitis C Screening: does qualify; Completed 08/11/2018  Vision Screening: Recommended annual ophthalmology exams for early detection of glaucoma and other disorders of the eye. Is the patient up to date with their annual eye exam?  Yes  Who is the provider or what is the name of the office in which the patient attends annual eye exams? Dr. Sharlot Gowda If pt is not established with a provider, would they like to be referred to a provider to establish care? No .   Dental Screening: Recommended annual dental exams for proper oral hygiene  Diabetic Foot Exam: n/a  Community Resource Referral / Chronic Care Management: CRR required this visit?  No   CCM required this visit?  No     Plan:     I have personally reviewed and noted the following in the patient's chart:   Medical  and social history Use of alcohol, tobacco or illicit drugs  Current medications and supplements including opioid prescriptions. Patient is not currently taking opioid prescriptions. Functional ability and status Nutritional status Physical activity Advanced directives List of other physicians Hospitalizations, surgeries, and ER visits in previous 12 months Vitals Screenings to include cognitive, depression, and falls Referrals and appointments  In addition, I have reviewed and discussed with patient certain preventive protocols, quality metrics, and best practice recommendations. A written personalized care plan for preventive services as well as general preventive health recommendations were provided to patient.     Barb Merino, LPN   08/21/1476   After Visit Summary: (Pick Up) Due to this being a telephonic visit, with patients personalized plan was offered to patient and patient has requested to Pick up at office.  Nurse Notes: none

## 2023-02-25 NOTE — Patient Instructions (Signed)
[name] , Thank you for taking time to come for your Medicare Wellness Visit. I appreciate your ongoing commitment to your health goals. Please review the following plan we discussed and let me know if I can assist you in the future.   Referrals/Orders/Follow-Ups/Clinician Recommendations: none  This is a list of the screening recommended for you and due dates:  Health Maintenance  Topic Date Due   COVID-19 Vaccine (6 - 2023-24 season) 04/03/2022   Zoster (Shingles) Vaccine (1 of 2) 04/02/2023*   Pneumonia Vaccine (1 of 2 - PCV) 12/31/2023*   Flu Shot  03/04/2023   Screening for Lung Cancer  10/19/2023   Medicare Annual Wellness Visit  02/25/2024   DTaP/Tdap/Td vaccine (2 - Td or Tdap) 02/19/2027   Colon Cancer Screening  12/20/2029   Hepatitis C Screening  Completed   HPV Vaccine  Aged Out  *Topic was postponed. The date shown is not the original due date.    Advanced directives: copy in chart  Next Medicare Annual Wellness Visit scheduled for next year: No, office will make appointment  Preventive Care 65 Years and Older, Male  Preventive care refers to lifestyle choices and visits with your health care provider that can promote health and wellness. What does preventive care include? A yearly physical exam. This is also called an annual well check. Dental exams once or twice a year. Routine eye exams. Ask your health care provider how often you should have your eyes checked. Personal lifestyle choices, including: Daily care of your teeth and gums. Regular physical activity. Eating a healthy diet. Avoiding tobacco and drug use. Limiting alcohol use. Practicing safe sex. Taking low doses of aspirin every day. Taking vitamin and mineral supplements as recommended by your health care provider. What happens during an annual well check? The services and screenings done by your health care provider during your annual well check will depend on your age, overall health,  lifestyle risk factors, and family history of disease. Counseling  Your health care provider may ask you questions about your: Alcohol use. Tobacco use. Drug use. Emotional well-being. Home and relationship well-being. Sexual activity. Eating habits. History of falls. Memory and ability to understand (cognition). Work and work Astronomer. Screening  You may have the following tests or measurements: Height, weight, and BMI. Blood pressure. Lipid and cholesterol levels. These may be checked every 5 years, or more frequently if you are over 80 years old. Skin check. Lung cancer screening. You may have this screening every year starting at age 30 if you have a 30-pack-year history of smoking and currently smoke or have quit within the past 15 years. Fecal occult blood test (FOBT) of the stool. You may have this test every year starting at age 36. Flexible sigmoidoscopy or colonoscopy. You may have a sigmoidoscopy every 5 years or a colonoscopy every 10 years starting at age 25. Prostate cancer screening. Recommendations will vary depending on your family history and other risks. Hepatitis C blood test. Hepatitis B blood test. Sexually transmitted disease (STD) testing. Diabetes screening. This is done by checking your blood sugar (glucose) after you have not eaten for a while (fasting). You may have this done every 1-3 years. Abdominal aortic aneurysm (AAA) screening. You may need this if you are a current or former smoker. Osteoporosis. You may be screened starting at age 17 if you are at high risk. Talk with your health care provider about your test results, treatment options, and if necessary, the need for more  tests. Vaccines  Your health care provider may recommend certain vaccines, such as: Influenza vaccine. This is recommended every year. Tetanus, diphtheria, and acellular pertussis (Tdap, Td) vaccine. You may need a Td booster every 10 years. Zoster vaccine. You may need this  after age 63. Pneumococcal 13-valent conjugate (PCV13) vaccine. One dose is recommended after age 95. Pneumococcal polysaccharide (PPSV23) vaccine. One dose is recommended after age 33. Talk to your health care provider about which screenings and vaccines you need and how often you need them. This information is not intended to replace advice given to you by your health care provider. Make sure you discuss any questions you have with your health care provider. Document Released: 08/16/2015 Document Revised: 04/08/2016 Document Reviewed: 05/21/2015 Elsevier Interactive Patient Education  2017 [employer].  Fall Prevention in the Home Falls can cause injuries. They can happen to people of all ages. There are many things you can do to make your home safe and to help prevent falls. What can I do on the outside of my home? Regularly fix the edges of walkways and driveways and fix any cracks. Remove anything that might make you trip as you walk through a door, such as a raised step or threshold. Trim any bushes or trees on the path to your home. Use bright outdoor lighting. Clear any walking paths of anything that might make someone trip, such as rocks or tools. Regularly check to see if handrails are loose or broken. Make sure that both sides of any steps have handrails. Any raised decks and porches should have guardrails on the edges. Have any leaves, snow, or ice cleared regularly. Use sand or salt on walking paths during winter. Clean up any spills in your garage right away. This includes oil or grease spills. What can I do in the bathroom? Use night lights. Install grab bars by the toilet and in the tub and shower. Do not use towel bars as grab bars. Use non-skid mats or decals in the tub or shower. If you need to sit down in the shower, use a plastic, non-slip stool. Keep the floor dry. Clean up any water that spills on the floor as soon as it happens. Remove soap buildup in the tub or  shower regularly. Attach bath mats securely with double-sided non-slip rug tape. Do not have throw rugs and other things on the floor that can make you trip. What can I do in the bedroom? Use night lights. Make sure that you have a light by your bed that is easy to reach. Do not use any sheets or blankets that are too big for your bed. They should not hang down onto the floor. Have a firm chair that has side arms. You can use this for support while you get dressed. Do not have throw rugs and other things on the floor that can make you trip. What can I do in the kitchen? Clean up any spills right away. Avoid walking on wet floors. Keep items that you use a lot in easy-to-reach places. If you need to reach something above you, use a strong step stool that has a grab bar. Keep electrical cords out of the way. Do not use floor polish or wax that makes floors slippery. If you must use wax, use non-skid floor wax. Do not have throw rugs and other things on the floor that can make you trip. What can I do with my stairs? Do not leave any items on the stairs. Make sure  that there are handrails on both sides of the stairs and use them. Fix handrails that are broken or loose. Make sure that handrails are as long as the stairways. Check any carpeting to make sure that it is firmly attached to the stairs. Fix any carpet that is loose or worn. Avoid having throw rugs at the top or bottom of the stairs. If you do have throw rugs, attach them to the floor with carpet tape. Make sure that you have a light switch at the top of the stairs and the bottom of the stairs. If you do not have them, ask someone to add them for you. What else can I do to help prevent falls? Wear shoes that: Do not have high heels. Have rubber bottoms. Are comfortable and fit you well. Are closed at the toe. Do not wear sandals. If you use a stepladder: Make sure that it is fully opened. Do not climb a closed stepladder. Make  sure that both sides of the stepladder are locked into place. Ask someone to hold it for you, if possible. Clearly mark and make sure that you can see: Any grab bars or handrails. First and last steps. Where the edge of each step is. Use tools that help you move around (mobility aids) if they are needed. These include: Canes. Walkers. Scooters. Crutches. Turn on the lights when you go into a dark area. Replace any light bulbs as soon as they burn out. Set up your furniture so you have a clear path. Avoid moving your furniture around. If any of your floors are uneven, fix them. If there are any pets around you, be aware of where they are. Review your medicines with your doctor. Some medicines can make you feel dizzy. This can increase your chance of falling. Ask your doctor what other things that you can do to help prevent falls. This information is not intended to replace advice given to you by your health care provider. Make sure you discuss any questions you have with your health care provider. Document Released: 05/16/2009 Document Revised: 12/26/2015 Document Reviewed: 08/24/2014 Elsevier Interactive Patient Education  2017 [employer].

## 2023-03-22 ENCOUNTER — Other Ambulatory Visit: Payer: Self-pay | Admitting: Internal Medicine

## 2023-03-29 ENCOUNTER — Ambulatory Visit: Payer: Medicare Other | Admitting: Cardiology

## 2023-04-11 ENCOUNTER — Other Ambulatory Visit: Payer: Self-pay | Admitting: Internal Medicine

## 2023-07-06 ENCOUNTER — Ambulatory Visit (INDEPENDENT_AMBULATORY_CARE_PROVIDER_SITE_OTHER): Payer: Medicare Other | Admitting: Internal Medicine

## 2023-07-06 ENCOUNTER — Encounter: Payer: Self-pay | Admitting: Internal Medicine

## 2023-07-06 VITALS — BP 118/78 | HR 84 | Temp 98.9°F | Ht 75.0 in | Wt 250.6 lb

## 2023-07-06 DIAGNOSIS — M25561 Pain in right knee: Secondary | ICD-10-CM | POA: Diagnosis not present

## 2023-07-06 DIAGNOSIS — R7309 Other abnormal glucose: Secondary | ICD-10-CM | POA: Diagnosis not present

## 2023-07-06 DIAGNOSIS — E7800 Pure hypercholesterolemia, unspecified: Secondary | ICD-10-CM | POA: Diagnosis not present

## 2023-07-06 DIAGNOSIS — I5042 Chronic combined systolic (congestive) and diastolic (congestive) heart failure: Secondary | ICD-10-CM

## 2023-07-06 DIAGNOSIS — I110 Hypertensive heart disease with heart failure: Secondary | ICD-10-CM

## 2023-07-06 DIAGNOSIS — I700 Atherosclerosis of aorta: Secondary | ICD-10-CM | POA: Diagnosis not present

## 2023-07-06 DIAGNOSIS — I119 Hypertensive heart disease without heart failure: Secondary | ICD-10-CM | POA: Diagnosis not present

## 2023-07-06 DIAGNOSIS — J432 Centrilobular emphysema: Secondary | ICD-10-CM | POA: Diagnosis not present

## 2023-07-06 DIAGNOSIS — G8929 Other chronic pain: Secondary | ICD-10-CM

## 2023-07-06 DIAGNOSIS — N400 Enlarged prostate without lower urinary tract symptoms: Secondary | ICD-10-CM

## 2023-07-06 DIAGNOSIS — F17210 Nicotine dependence, cigarettes, uncomplicated: Secondary | ICD-10-CM

## 2023-07-06 DIAGNOSIS — M25562 Pain in left knee: Secondary | ICD-10-CM | POA: Diagnosis not present

## 2023-07-06 DIAGNOSIS — N528 Other male erectile dysfunction: Secondary | ICD-10-CM

## 2023-07-06 NOTE — Assessment & Plan Note (Addendum)
Chronic, well controlled.  He will continue with Earbyclor 40/25mg  daily, spironolactone 25mg  daily and verapamil 240mg  CR twice daily. He is encouraged to follow low sodium diet. He will f/u in four to six months for re-evaluation.

## 2023-07-06 NOTE — Assessment & Plan Note (Signed)
Chronic, sx likely due to OA. I will refer him to Ortho as requested. He is advised to apply topical Voltaren gel to front/back/sides of knees two or three times daily as needed.

## 2023-07-06 NOTE — Assessment & Plan Note (Signed)
Chronic, LDL goal is less than 70 due to aortic atherosclerosis. He will continue with atorvastatin MWF without any issues.

## 2023-07-06 NOTE — Patient Instructions (Signed)
Hypertension, Adult Hypertension is another name for high blood pressure. High blood pressure forces your heart to work harder to pump blood. This can cause problems over time. There are two numbers in a blood pressure reading. There is a top number (systolic) over a bottom number (diastolic). It is best to have a blood pressure that is below 120/80. What are the causes? The cause of this condition is not known. Some other conditions can lead to high blood pressure. What increases the risk? Some lifestyle factors can make you more likely to develop high blood pressure: Smoking. Not getting enough exercise or physical activity. Being overweight. Having too much fat, sugar, calories, or salt (sodium) in your diet. Drinking too much alcohol. Other risk factors include: Having any of these conditions: Heart disease. Diabetes. High cholesterol. Kidney disease. Obstructive sleep apnea. Having a family history of high blood pressure and high cholesterol. Age. The risk increases with age. Stress. What are the signs or symptoms? High blood pressure may not cause symptoms. Very high blood pressure (hypertensive crisis) may cause: Headache. Fast or uneven heartbeats (palpitations). Shortness of breath. Nosebleed. Vomiting or feeling like you may vomit (nauseous). Changes in how you see. Very bad chest pain. Feeling dizzy. Seizures. How is this treated? This condition is treated by making healthy lifestyle changes, such as: Eating healthy foods. Exercising more. Drinking less alcohol. Your doctor may prescribe medicine if lifestyle changes do not help enough and if: Your top number is above 130. Your bottom number is above 80. Your personal target blood pressure may vary. Follow these instructions at home: Eating and drinking  If told, follow the DASH eating plan. To follow this plan: Fill one half of your plate at each meal with fruits and vegetables. Fill one fourth of your plate  at each meal with whole grains. Whole grains include whole-wheat pasta, brown rice, and whole-grain bread. Eat or drink low-fat dairy products, such as skim milk or low-fat yogurt. Fill one fourth of your plate at each meal with low-fat (lean) proteins. Low-fat proteins include fish, chicken without skin, eggs, beans, and tofu. Avoid fatty meat, cured and processed meat, or chicken with skin. Avoid pre-made or processed food. Limit the amount of salt in your diet to less than 1,500 mg each day. Do not drink alcohol if: Your doctor tells you not to drink. You are pregnant, may be pregnant, or are planning to become pregnant. If you drink alcohol: Limit how much you have to: 0-1 drink a day for women. 0-2 drinks a day for men. Know how much alcohol is in your drink. In the [place] one drink equals one 12 oz bottle of beer (355 mL), one 5 oz glass of wine (148 mL), or one 1 oz glass of hard liquor (44 mL). Lifestyle  Work with your doctor to stay at a healthy weight or to lose weight. Ask your doctor what the best weight is for you. Get at least 30 minutes of exercise that causes your heart to beat faster (aerobic exercise) most days of the week. This may include walking, swimming, or biking. Get at least 30 minutes of exercise that strengthens your muscles (resistance exercise) at least 3 days a week. This may include lifting weights or doing Pilates. Do not smoke or use any products that contain nicotine or tobacco. If you need help quitting, ask your doctor. Check your blood pressure at home as told by your doctor. Keep all follow-up visits. Medicines Take over-the-counter and prescription medicines   only as told by your doctor. Follow directions carefully. Do not skip doses of blood pressure medicine. The medicine does not work as well if you skip doses. Skipping doses also puts you at risk for problems. Ask your doctor about side effects or reactions to medicines that you should watch  for. Contact a doctor if: You think you are having a reaction to the medicine you are taking. You have headaches that keep coming back. You feel dizzy. You have swelling in your ankles. You have trouble with your vision. Get help right away if: You get a very bad headache. You start to feel mixed up (confused). You feel weak or numb. You feel faint. You have very bad pain in your: Chest. Belly (abdomen). You vomit more than once. You have trouble breathing. These symptoms may be an emergency. Get help right away. Call 911. Do not wait to see if the symptoms will go away. Do not drive yourself to the hospital. Summary Hypertension is another name for high blood pressure. High blood pressure forces your heart to work harder to pump blood. For most people, a normal blood pressure is less than 120/80. Making healthy choices can help lower blood pressure. If your blood pressure does not get lower with healthy choices, you may need to take medicine. This information is not intended to replace advice given to you by your health care provider. Make sure you discuss any questions you have with your health care provider. Document Revised: 05/08/2021 Document Reviewed: 05/08/2021 Elsevier Patient Education  2024 Elsevier Inc.  

## 2023-07-06 NOTE — Assessment & Plan Note (Signed)
Previous labs reviewed, his A1c has been elevated in the past. I will check an A1c today. Reminded to avoid refined sugars including sugary drinks/foods and processed meats including bacon, sausages and deli meats.     

## 2023-07-06 NOTE — Progress Notes (Signed)
I,Victoria T Deloria Lair, CMA,acting as a Neurosurgeon for Gwynneth Aliment, MD.,have documented all relevant documentation on the behalf of Gwynneth Aliment, MD,as directed by  Gwynneth Aliment, MD while in the presence of Gwynneth Aliment, MD.  Subjective:  [name] , male    DOB: 29-Oct-1950 , 72 y.o.   MRN: [id number]  Chief Complaint  Patient presents with   Hypertension   Hyperlipidemia    HPI  Patient is here for a hypertension & cholesterol follow up.  He reports compliance with meds. He denies headaches, chest pain and shortness of breath.  He has no specific concerns or complaints at this time.   Hypertension This is a chronic problem. The current episode started more than 1 year ago. The problem has been gradually improving since onset. The problem is controlled. Risk factors for coronary artery disease include obesity and male gender. Past treatments include diuretics, calcium channel blockers and angiotensin blockers. The current treatment provides moderate improvement.     Past Medical History:  Diagnosis Date   COPD (chronic obstructive pulmonary disease) (HCC)    Diabetes mellitus without complication (HCC) type 2    Per pt "Borderline"/"pre-diabetic"   GERD (gastroesophageal reflux disease)    H/O: CVA (cerebrovascular accident) 2003   Patient states the only residual deficit is an occasional stutter.   Hyperlipidemia    Hypertension    OSA (obstructive sleep apnea)    wears CPAP q night   Wears partial denture upper      Family History  Problem Relation Age of Onset   Cancer Mother    Stroke Father    Heart disease Father      Current Outpatient Medications:    albuterol (VENTOLIN HFA) 108 (90 Base) MCG/ACT inhaler, Inhale 2 puffs into the lungs every 6 (six) hours as needed for wheezing or shortness of breath., Disp: 8 g, Rfl: 2   aspirin EC 81 MG tablet, Take 81 mg by mouth in the morning., Disp: , Rfl:    atorvastatin (LIPITOR) 10 MG tablet, TAKE  ONE TABLET ONCE DAILY BY MOUTH MONDAY, WEDNESDAY AND FRIDAY WTIH EVENING MEAL, Disp: 36 tablet, Rfl: 2   Azilsartan-Chlorthalidone (EDARBYCLOR) 40-25 MG TABS, TAKE ONE TABLET DAILY IN THE MORNING, Disp: 90 tablet, Rfl: 3   ciprofloxacin-dexamethasone (CIPRODEX) OTIC suspension, Place 4 drops into the left ear 2 (two) times daily., Disp: 7.5 mL, Rfl: 0   Omega-3 Fatty Acids (FISH OIL PO), Take 1 capsule by mouth in the morning., Disp: , Rfl:    potassium chloride SA (KLOR-CON M) 20 MEQ tablet, TAKE 1&1/2 tablets by MOUTH daily, Disp: 135 tablet, Rfl: 2   spironolactone (ALDACTONE) 25 MG tablet, TAKE 1 TABLET BY MOUTH IN THE  MORNING, Disp: 100 tablet, Rfl: 2   triamcinolone ointment (KENALOG) 0.1 %, Apply 1 application  topically 2 (two) times daily as needed (eczema)., Disp: , Rfl:    verapamil (CALAN-SR) 240 MG CR tablet, TAKE 1 TABLET BY MOUTH TWICE  DAILY, Disp: 200 tablet, Rfl: 2   VITAMIN D PO, Take 1 capsule by mouth in the morning., Disp: , Rfl:    oxyCODONE-acetaminophen (PERCOCET) 5-325 MG tablet, Take 1 tablet by mouth every 6 (six) hours as needed for severe pain or moderate pain (post-operatively). (Patient not taking: Reported on 02/25/2023), Disp: 15 tablet, Rfl: 0   senna-docusate (SENOKOT-S) 8.6-50 MG tablet, Take 1 tablet by mouth 2 (two) times daily. While taking strong pain meds to prevent constipation (Patient  not taking: Reported on 02/25/2023), Disp: 10 tablet, Rfl: 0   sildenafil (VIAGRA) 100 MG tablet, SMARTSIG:1 Tablet(s) By Mouth (Patient not taking: Reported on 02/25/2023), Disp: , Rfl:    tadalafil (CIALIS) 5 MG tablet, One tab po qd (Patient not taking: Reported on 12/31/2022), Disp: 90 tablet, Rfl: 0   No Known Allergies   Review of Systems  Constitutional: Negative.   HENT: Negative.    Respiratory: Negative.    Cardiovascular: Negative.   Gastrointestinal: Negative.   Genitourinary: Negative.   Musculoskeletal:  Positive for arthralgias.       He c/o worsening knee  pain. Denies recent fall/trauma. Feels this is a chronic issue, that has worsened over time. R>L knee pain. There is pain with ambulation. He takes Tylenol prn. He has also used hot/cold packs with minimal relief of his sx.    Skin: Negative.   Allergic/Immunologic: Negative.   Neurological: Negative.   Hematological: Negative.      Today's Vitals   07/06/23 1020  BP: 118/78  Pulse: 84  Temp: 98.9 F (37.2 C)  SpO2: 98%  Weight: 250 lb 9.6 oz (113.7 kg)  Height: 6\' 3"  (1.905 m)   Body mass index is 31.32 kg/m.  Wt Readings from Last 3 Encounters:  07/06/23 250 lb 9.6 oz (113.7 kg)  02/05/23 248 lb 9.6 oz (112.8 kg)  01/28/23 245 lb 3.2 oz (111.2 kg)     Objective:  Physical Exam Vitals and nursing note reviewed.  Constitutional:      Appearance: Normal appearance.  HENT:     Head: Normocephalic and atraumatic.  Eyes:     Extraocular Movements: Extraocular movements intact.  Cardiovascular:     Rate and Rhythm: Normal rate and regular rhythm.     Heart sounds: Normal heart sounds.  Pulmonary:     Effort: Pulmonary effort is normal.     Breath sounds: Normal breath sounds.  Musculoskeletal:     Cervical back: Normal range of motion.     Comments: R>L crepitus  Skin:    General: Skin is warm.  Neurological:     General: No focal deficit present.     Mental Status: He is alert.  Psychiatric:        Mood and Affect: Mood normal.        Assessment And Plan:  Hypertensive heart disease with heart failure (HCC) Assessment & Plan: Chronic, well controlled.  2023 echocardiogram revealed mildly decreased EF 45-50%. He will continue with Earbyclor 40/25mg  daily, spironolactone 25mg  daily and verapamil 240mg  CR twice daily. He is encouraged to follow low sodium diet. He will f/u in four to six months for re-evaluation.   Orders: -     CMP14+EGFR  Chronic combined systolic and diastolic CHF (congestive heart failure) (HCC) Assessment & Plan: Most recent echo  reviewed. Importance of dietary/medication compliance was discussed with the patient. He is also followed by Cardiology.    Chronic pain of both knees Assessment & Plan: Chronic, sx likely due to OA. I will refer him to Ortho as requested. He is advised to apply topical Voltaren gel to front/back/sides of knees two or three times daily as needed.   Orders: -     Ambulatory referral to Orthopedic Surgery  Pure hypercholesterolemia Assessment & Plan: Chronic, LDL goal is less than 70 due to aortic atherosclerosis. He will continue with atorvastatin MWF without any issues.    Orders: -     TSH  Other abnormal glucose Assessment & Plan: Previous  labs reviewed, his A1c has been elevated in the past. I will check an A1c today. Reminded to avoid refined sugars including sugary drinks/foods and processed meats including bacon, sausages and deli meats.    Orders: -     Hemoglobin A1c     Return if symptoms worsen or fail to improve.  Patient was given opportunity to ask questions. Patient verbalized understanding of the plan and was able to repeat key elements of the plan. All questions were answered to their satisfaction.   I, Gwynneth Aliment, MD, have reviewed all documentation for this visit. The documentation on 07/06/23 for the exam, diagnosis, procedures, and orders are all accurate and complete.   IF YOU HAVE BEEN REFERRED TO A SPECIALIST, IT MAY TAKE 1-2 WEEKS TO SCHEDULE/PROCESS THE REFERRAL. IF YOU HAVE NOT HEARD FROM US/SPECIALIST IN TWO WEEKS, PLEASE GIVE [name] A CALL AT [phone] X [phone].   THE PATIENT IS ENCOURAGED TO PRACTICE SOCIAL DISTANCING DUE TO THE COVID-19 PANDEMIC.

## 2023-07-06 NOTE — Assessment & Plan Note (Signed)
 Chronic, encouraged to follow heart healthy lifestyle. Clean eating, regular exercise and stress management are all a part of this lifestyle. She is also encouraged to continue with statin therapy.

## 2023-07-06 NOTE — Assessment & Plan Note (Signed)
Most recent echo reviewed. Importance of dietary/medication compliance was discussed with the patient. He is also followed by Cardiology.

## 2023-07-06 NOTE — Assessment & Plan Note (Addendum)
Chronic, well controlled.  2023 echocardiogram revealed mildly decreased EF 45-50%. He will continue with Earbyclor 40/25mg  daily, spironolactone 25mg  daily and verapamil 240mg  CR twice daily. He is encouraged to follow low sodium diet. He will f/u in four to six months for re-evaluation.

## 2023-07-07 LAB — CMP14+EGFR
ALT: 18 [IU]/L (ref 0–44)
AST: 24 [IU]/L (ref 0–40)
Albumin: 3.5 g/dL — ABNORMAL LOW (ref 3.9–4.9)
Alkaline Phosphatase: 59 [IU]/L (ref 44–121)
BUN/Creatinine Ratio: 12 (ref 10–24)
BUN: 13 mg/dL (ref 8–27)
Bilirubin Total: 0.2 mg/dL (ref 0.0–1.2)
CO2: 25 mmol/L (ref 20–29)
Calcium: 9.7 mg/dL (ref 8.6–10.2)
Chloride: 101 mmol/L (ref 96–106)
Creatinine, Ser: 1.05 mg/dL (ref 0.76–1.27)
Globulin, Total: 3.4 g/dL (ref 1.5–4.5)
Glucose: 104 mg/dL — ABNORMAL HIGH (ref 70–99)
Potassium: 3.5 mmol/L (ref 3.5–5.2)
Sodium: 139 mmol/L (ref 134–144)
Total Protein: 6.9 g/dL (ref 6.0–8.5)
eGFR: 76 mL/min/{1.73_m2} (ref >59)

## 2023-07-07 LAB — HEMOGLOBIN A1C
Est. average glucose Bld gHb Est-mCnc: 134 mg/dL
Hgb A1c MFr Bld: 6.3 % — ABNORMAL HIGH (ref 4.8–5.6)

## 2023-07-07 LAB — TSH: TSH: 0.912 u[IU]/mL (ref 0.450–4.500)

## 2023-07-15 ENCOUNTER — Encounter: Payer: Self-pay | Admitting: Orthopaedic Surgery

## 2023-07-15 ENCOUNTER — Ambulatory Visit: Payer: Medicare Other | Admitting: Orthopaedic Surgery

## 2023-07-15 ENCOUNTER — Other Ambulatory Visit (INDEPENDENT_AMBULATORY_CARE_PROVIDER_SITE_OTHER): Payer: Medicare Other

## 2023-07-15 VITALS — Ht 75.0 in | Wt 250.0 lb

## 2023-07-15 DIAGNOSIS — M25561 Pain in right knee: Secondary | ICD-10-CM

## 2023-07-15 DIAGNOSIS — M25562 Pain in left knee: Secondary | ICD-10-CM

## 2023-07-15 DIAGNOSIS — G8929 Other chronic pain: Secondary | ICD-10-CM

## 2023-07-15 NOTE — Progress Notes (Signed)
Office Visit Note   Patient: Terry Andrews           Date of Birth: Apr 27, 1951           MRN: [id number] Visit Date: 07/15/2023              Requested by: Dorothyann Peng, MD [address] [place],  [place] [address] PCP: Dorothyann Peng, MD   Assessment & Plan: Visit Diagnoses:  1. Chronic pain of both knees     Plan: Glenn is a 72 year old gentleman with end-stage bilateral knee DJD.  Findings were reviewed with the patient and treatment options were discussed.  For now he would like to just continue what he is doing with activity modification and Tylenol as needed.  He also uses Voltaren gel.  He will follow-up with [name] if he decides he wants to try some cortisone injections.  Follow-Up Instructions: No follow-ups on file.   Orders:  Orders Placed This Encounter  Procedures   XR KNEE 3 VIEW LEFT   XR KNEE 3 VIEW RIGHT   No orders of the defined types were placed in this encounter.     Procedures: No procedures performed   Clinical Data: No additional findings.   Subjective: Chief Complaint  Patient presents with   Right Knee - Pain   Left Knee - Pain    HPI Patient is a very pleasant 72 year old gentleman here for evaluation of bilateral knee pain that has gotten worse in the last year.  Experiences frequent popping and giving way especially in the right knee.  He has pain with activity.  Denies any nighttime pain.  Uses Voltaren gel and Tylenol.  Has tried Aleve in the past but due to congestive heart failure has been instructed not to use it although Aleve is very effective for the pain. Review of Systems  Constitutional: Negative.   HENT: Negative.    Eyes: Negative.   Respiratory: Negative.    Cardiovascular: Negative.   Gastrointestinal: Negative.   Endocrine: Negative.   Genitourinary: Negative.   Skin: Negative.   Allergic/Immunologic: Negative.   Neurological: Negative.   Hematological: Negative.   Psychiatric/Behavioral: Negative.     All other systems reviewed and are negative.    Objective: Vital Signs: Ht 6\' 3"  (1.905 m)   Wt 250 lb (113.4 kg)   BMI 31.25 kg/m   Physical Exam Vitals and nursing note reviewed.  Constitutional:      Appearance: He is well-developed.  HENT:     Head: Normocephalic and atraumatic.  Eyes:     Pupils: Pupils are equal, round, and reactive to light.  Pulmonary:     Effort: Pulmonary effort is normal.  Abdominal:     Palpations: Abdomen is soft.  Musculoskeletal:        General: Normal range of motion.     Cervical back: Neck supple.  Skin:    General: Skin is warm.  Neurological:     Mental Status: He is alert and oriented to person, place, and time.  Psychiatric:        Behavior: Behavior normal.        Thought Content: Thought content normal.        Judgment: Judgment normal.     Ortho Exam Exam of bilateral knees show 2+ patellofemoral crepitus with range of motion.  No joint effusion.  Significant pain along the medial joint line. Specialty Comments:  No specialty comments available.  Imaging: XR KNEE 3 VIEW LEFT Result  Date: 07/15/2023 X-rays of the left knee show advanced tricompartmental degenerative joint disease.  Bone-on-bone joint space narrowing.  XR KNEE 3 VIEW RIGHT Result Date: 07/15/2023 X-rays demonstrate severe osteoarthritis.  Bone-on-bone joint space narrowing.    PMFS History: Patient Active Problem List   Diagnosis Date Noted   Pure hypercholesterolemia 07/06/2023   Chronic combined systolic and diastolic CHF (congestive heart failure) (HCC) 07/06/2023   Chronic pain of both knees 07/06/2023   Atherosclerosis of aorta (HCC) 12/31/2022   Atherosclerosis of native coronary artery of native heart without angina pectoris 12/31/2022   Cigarette nicotine dependence without complication 12/31/2022   Centrilobular emphysema (HCC) 09/09/2022   Dyspnea on exertion 08/14/2022   Abnormal stress ECG 08/14/2022   Hypertensive heart disease with  heart failure (HCC) 08/14/2022   Hypertensive heart disease without CHF 08/14/2022   Hypertensive heart disease without heart failure 04/22/2021   Other abnormal glucose 04/22/2021   Class 1 obesity due to excess calories with serious comorbidity and body mass index (BMI) of 34.0 to 34.9 in adult 04/22/2021   Obstructive sleep apnea treated with continuous positive airway pressure (CPAP) 05/05/2017   Past Medical History:  Diagnosis Date   COPD (chronic obstructive pulmonary disease) (HCC)    Diabetes mellitus without complication (HCC) type 2    Per pt "Borderline"/"pre-diabetic"   GERD (gastroesophageal reflux disease)    H/O: CVA (cerebrovascular accident) 2003   Patient states the only residual deficit is an occasional stutter.   Hyperlipidemia    Hypertension    OSA (obstructive sleep apnea)    wears CPAP q night   Wears partial denture upper     Family History  Problem Relation Age of Onset   Cancer Mother    Stroke Father    Heart disease Father     Past Surgical History:  Procedure Laterality Date   ANAL FISTULOTOMY N/A 05/29/2021   Procedure: WIDE EXCISION OF HYDRADINITIS;  Surgeon: Romie Levee, MD;  Location[facility];  Service: General;  Laterality: N/A;   COLONOSCOPY  12/21/2019   HYDROCELE EXCISION Right 02/05/2023   Procedure: OPEN HYDROCELECTOMY ADULT;  Surgeon: Loletta Parish., MD;  Location: [facility];  Service: Urology;  Laterality: Right;   LEFT HEART CATH AND CORONARY ANGIOGRAPHY N/A 08/14/2022   Procedure: LEFT HEART CATH AND CORONARY ANGIOGRAPHY;  Surgeon: Yates Decamp, MD;  Location: [facility];  Service: Cardiovascular;  Laterality: N/A;   ORCHIOPEXY Right 02/05/2023   Procedure: ORCHIOPEXY ADULT;  Surgeon: Loletta Parish., MD;  Location: [facility];  Service: Urology;  Laterality: Right;   Social History   Occupational History   Occupation: retired  Tobacco Use   Smoking status:  Every Day    Current packs/day: 0.50    Average packs/day: 0.5 packs/day for 59.9 years (30.0 ttl pk-yrs)    Types: Cigarettes    Start date: 1965   Smokeless tobacco: Never   Tobacco comments:    he has cut back number cigs/smoked,   Vaping Use   Vaping status: Never Used  Substance and Sexual Activity   Alcohol use: Not Currently    Comment: ocassionally   Drug use: Yes    Types: Marijuana    Comment: every once in a while, (as of 05/22/21 patient last smoked marijuana yeterday 6-13-2024una on 05/21/2021)   Sexual activity: Yes

## 2023-07-27 ENCOUNTER — Other Ambulatory Visit: Payer: Self-pay | Admitting: Cardiology

## 2023-08-12 DIAGNOSIS — H524 Presbyopia: Secondary | ICD-10-CM | POA: Diagnosis not present

## 2023-08-12 DIAGNOSIS — H5203 Hypermetropia, bilateral: Secondary | ICD-10-CM | POA: Diagnosis not present

## 2023-08-12 DIAGNOSIS — H16041 Marginal corneal ulcer, right eye: Secondary | ICD-10-CM | POA: Diagnosis not present

## 2023-08-12 DIAGNOSIS — H52223 Regular astigmatism, bilateral: Secondary | ICD-10-CM | POA: Diagnosis not present

## 2023-08-24 DIAGNOSIS — N281 Cyst of kidney, acquired: Secondary | ICD-10-CM | POA: Diagnosis not present

## 2023-08-30 DIAGNOSIS — H16041 Marginal corneal ulcer, right eye: Secondary | ICD-10-CM | POA: Diagnosis not present

## 2023-09-16 NOTE — Progress Notes (Deleted)
 He may be a possible candidate for the Hermes HF study with LVEF > 40%

## 2023-09-22 ENCOUNTER — Ambulatory Visit: Payer: Medicare Other | Admitting: Cardiology

## 2023-09-24 ENCOUNTER — Encounter: Payer: Self-pay | Admitting: Cardiology

## 2023-09-24 ENCOUNTER — Ambulatory Visit: Payer: Medicare Other | Attending: Cardiology | Admitting: Cardiology

## 2023-09-24 VITALS — BP 132/72 | HR 61 | Resp 16 | Ht 75.0 in | Wt 257.2 lb

## 2023-09-24 DIAGNOSIS — E7800 Pure hypercholesterolemia, unspecified: Secondary | ICD-10-CM

## 2023-09-24 DIAGNOSIS — Z720 Tobacco use: Secondary | ICD-10-CM

## 2023-09-24 DIAGNOSIS — R0609 Other forms of dyspnea: Secondary | ICD-10-CM

## 2023-09-24 DIAGNOSIS — I119 Hypertensive heart disease without heart failure: Secondary | ICD-10-CM | POA: Diagnosis not present

## 2023-09-24 NOTE — Progress Notes (Signed)
Cardiology Office Note:  .   Date:  09/24/2023  ID:  Terry Andrews, DOB [birth year], MRN [id number] PCP: Dorothyann Peng, MD  [facility] Providers Cardiologist:  Yates Decamp, MD   History of Present Illness: .   Terry Andrews is a 73 y.o. African-American male with hypertension, history of remote stroke in 2003 with no residual defects probably related to hypertension, tobacco use disorder, obstructive sleep apnea on CPAP, underwent cardiac catheterization with abnormal nuclear stress test on 08/14/2022 revealing normal coronary arteries.     Except for mild chronic dyspnea that he had not stated much before, states that he is doing well.   Discussed the use of AI scribe software for clinical note transcription with the patient, who gave verbal consent to proceed.  History of Present Illness   The patient, a 73 year old individual with a history of hypertension and heart disease, presents with concerns about knee pain. The patient reports that the pain worsens in the winter and has been evaluated by an orthopedic doctor who suggested a potential knee replacement. However, the patient is hesitant about this option due to his home situation and the need to care for his wife who has dementia.  The patient also discusses his smoking habits, reporting that he smokes less than a pack of cigarettes over a span of three days to a week. The patient acknowledges that the frequency of smoking can increase during times of stress or frustration.  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 The patient reports that this can be a challenging and frustrating role at times, but he finds strength and support through his faith.     Labs   Lab Results  Component Value Date   CHOL 105 12/31/2022   HDL 57 12/31/2022   LDLCALC 37 12/31/2022   TRIG 43 12/31/2022   CHOLHDL 1.8 12/31/2022   Lab Results  Component Value Date   NA 139  07/06/2023   K 3.5 07/06/2023   CO2 25 07/06/2023   GLUCOSE 104 (H) 07/06/2023   BUN 13 07/06/2023   CREATININE 1.05 07/06/2023   CALCIUM 9.7 07/06/2023   EGFR 76 07/06/2023   GFRNONAA 84 09/23/2020      Latest Ref Rng & Units 07/06/2023   11:08 AM 02/05/2023   12:10 PM 12/31/2022   10:17 AM  BMP  Glucose 70 - 99 mg/dL 409  811  914   BUN 8 - 27 mg/dL 13  16  15    Creatinine 0.76 - 1.27 mg/dL 7.82  9.56  2.13   BUN/Creat Ratio 10 - 24 12   14    Sodium 134 - 144 mmol/L 139  139  138   Potassium 3.5 - 5.2 mmol/L 3.5  3.3  3.6   Chloride 96 - 106 mmol/L 101  103  102   CO2 20 - 29 mmol/L 25   21   Calcium 8.6 - 10.2 mg/dL 9.7   9.9       Latest Ref Rng & Units 02/05/2023   12:10 PM 08/04/2022   10:16 AM 05/29/2021    6:04 AM  CBC  WBC 3.4 - 10.8 x10E3/uL  6.5    Hemoglobin 13.0 - 17.0 g/dL 08.6  57.8  46.9   Hematocrit 39.0 - 52.0 % 44.0  39.7  43.0   Platelets 150 - 450 x10E3/uL  311     Lab Results  Component Value Date  HGBA1C 6.3 (H) 07/06/2023    Lab Results  Component Value Date   TSH 0.912 07/06/2023    Review of Systems  Cardiovascular:  Positive for dyspnea on exertion. Negative for chest pain, leg swelling, orthopnea and palpitations.   Physical Exam:   VS:  BP 132/72 (BP Location: Left Arm, Patient Position: Sitting, Cuff Size: Large)   Pulse 61   Resp 16   Ht 6\' 3"  (1.905 m)   Wt 257 lb 3.2 oz (116.7 kg)   SpO2 97%   BMI 32.15 kg/m    Wt Readings from Last 3 Encounters:  09/24/23 257 lb 3.2 oz (116.7 kg)  07/15/23 250 lb (113.4 kg)  07/06/23 250 lb 9.6 oz (113.7 kg)    Physical Exam Neck:     Vascular: No carotid bruit or JVD.  Cardiovascular:     Rate and Rhythm: Normal rate and regular rhythm.     Pulses: Intact distal pulses.     Heart sounds: Normal heart sounds. No murmur heard.    No gallop.  Pulmonary:     Effort: Pulmonary effort is normal.     Breath sounds: Normal breath sounds.  Abdominal:     General: Bowel sounds are normal.      Palpations: Abdomen is soft.  Musculoskeletal:     Right lower leg: No edema.     Left lower leg: No edema.    Studies Reviewed: Marland Kitchen    Left Heart Catheterization 08/14/22 :   Normal coronary arteries.  Stress test abnormalities probably related to hypertensive heart disease.       EKG:    EKG Interpretation Date/Time:  Friday September 24 2023 11:16:08 EST Ventricular Rate:  71 PR Interval:  220 QRS Duration:  168 QT Interval:  450 QTC Calculation: 489 R Axis:   0  Text Interpretation: EKG 09/24/2023: Sinus rhythm with first-degree block at rate of 71 bpm, left heart enlargement, incomplete left bundle branch block.  No significant change from 08/10/2022. Confirmed by Delrae Rend [phone]) on 09/24/2023 11:35:12 AM   (Atypical LBBB, unchanged from previous).   Medications and allergies    No Known Allergies   Current Outpatient Medications:    aspirin EC 81 MG tablet, Take 81 mg by mouth in the morning., Disp: , Rfl:    atorvastatin (LIPITOR) 10 MG tablet, TAKE ONE TABLET ONCE DAILY BY MOUTH MONDAY, WEDNESDAY AND FRIDAY WTIH EVENING MEAL, Disp: 36 tablet, Rfl: 2   Azilsartan-Chlorthalidone (EDARBYCLOR) 40-25 MG TABS, TAKE ONE TABLET DAILY IN THE MORNING, Disp: 90 tablet, Rfl: 3   Omega-3 Fatty Acids (FISH OIL PO), Take 1 capsule by mouth in the morning., Disp: , Rfl:    potassium chloride SA (KLOR-CON M) 20 MEQ tablet, TAKE 1&1/2 tablets by MOUTH daily, Disp: 135 tablet, Rfl: 2   senna-docusate (SENOKOT-S) 8.6-50 MG tablet, Take 1 tablet by mouth 2 (two) times daily. While taking strong pain meds to prevent constipation, Disp: 10 tablet, Rfl: 0   sildenafil (VIAGRA) 100 MG tablet, , Disp: , Rfl:    spironolactone (ALDACTONE) 25 MG tablet, TAKE 1 TABLET BY MOUTH IN THE  MORNING, Disp: 90 tablet, Rfl: 0   tobramycin-dexamethasone (TOBRADEX) ophthalmic solution, Place 1 drop into the right eye 4 (four) times daily., Disp: , Rfl:    triamcinolone ointment (KENALOG) 0.1 %, Apply 1  application  topically 2 (two) times daily as needed (eczema)., Disp: , Rfl:    verapamil (CALAN-SR) 240 MG CR tablet, TAKE 1 TABLET BY MOUTH  TWICE  DAILY, Disp: 180 tablet, Rfl: 0   VITAMIN D PO, Take 1 capsule by mouth in the morning., Disp: , Rfl:    ASSESSMENT AND PLAN: .      ICD-10-CM   1. Hypertensive heart disease without heart failure  I11.9 EKG 12-Lead    2. Pure hypercholesterolemia  E78.00     3. Dyspnea on exertion  R06.09     4. Tobacco use  Z72.0      Assessment and Plan    Hypertension Hypertension is well-controlled with current medications. Blood pressure is well controlled with no significant changes in EKG or other cardiovascular assessments. Continue Edarbichlor 40/25 mg, spironolactone 25 mg, and verapamil SR 240 mg once daily. There is no evidence of heart failure, dyspnea, or peripheral edema. Lungs are clear, and coronary arteries show no blockage. Monitor for new symptoms of heart failure and encourage smoking cessation. Dyspnea has remained stable. Smoking cessation discussed again (5-6 cig per day).  Hyperlipidemia Cholesterol levels are at goal with an LDL of 37 mg/dL. Continue atorvastatin 10 mg once daily.  Knee Pain Chronic knee pain worsens in winter. There is hesitance about knee replacement surgery due to caregiving responsibilities, with a discussion on potential temporary dependency post-surgery. Consider knee replacement if pain becomes unmanageable.  General Health Maintenance General health is good with normal kidney and liver function. Weight gain is noted, but there is active work on weight loss. Encourage regular physical activity, monitor weight, and promote a healthy diet.  Follow-up Schedule a follow-up appointment with Dr. Allyne Gee for routine medical care and I will see him back on a PRN basis.  Signed,  Yates Decamp, MD, [facility] 09/24/2023, 12:54 PM [facility] [address] [place], [place] [address] Phone: [phone].  Fax:  [phone]

## 2023-09-24 NOTE — Patient Instructions (Signed)
 Medication Instructions:   *If you need a refill on your cardiac medications before your next appointment, please call your pharmacy*   Lab Work:  If you have labs (blood work) drawn today and your tests are completely normal, you will receive your results only by: MyChart Message (if you have MyChart) OR A paper copy in the mail If you have any lab test that is abnormal or we need to change your treatment, we will call you to review the results.   Testing/Procedures:    Follow-Up: At [facility], you and your health needs are our priority.  As part of our continuing mission to provide you with exceptional heart care, we have created designated Provider Care Teams.  These Care Teams include your primary Cardiologist (physician) and Advanced Practice Providers (APPs -  Physician Assistants and Nurse Practitioners) who all work together to provide you with the care you need, when you need it.  We recommend signing up for the patient portal called "MyChart".  Sign up information is provided on this After Visit Summary.  MyChart is used to connect with patients for Virtual Visits (Telemedicine).  Patients are able to view lab/test results, encounter notes, upcoming appointments, etc.  Non-urgent messages can be sent to your provider as well.   To learn more about what you can do with MyChart, go to [email or web address].

## 2023-09-25 ENCOUNTER — Other Ambulatory Visit: Payer: Self-pay | Admitting: Cardiology

## 2023-09-26 ENCOUNTER — Other Ambulatory Visit: Payer: Self-pay | Admitting: Cardiology

## 2023-11-28 ENCOUNTER — Other Ambulatory Visit: Payer: Self-pay | Admitting: Internal Medicine

## 2023-12-29 ENCOUNTER — Other Ambulatory Visit: Payer: Self-pay | Admitting: Internal Medicine

## 2024-01-06 ENCOUNTER — Encounter: Payer: Self-pay | Admitting: Internal Medicine

## 2024-01-06 ENCOUNTER — Ambulatory Visit: Payer: Self-pay | Admitting: Internal Medicine

## 2024-01-06 VITALS — BP 128/74 | HR 83 | Temp 98.6°F | Ht 75.0 in | Wt 257.4 lb

## 2024-01-06 DIAGNOSIS — Z0000 Encounter for general adult medical examination without abnormal findings: Secondary | ICD-10-CM | POA: Diagnosis not present

## 2024-01-06 DIAGNOSIS — H6122 Impacted cerumen, left ear: Secondary | ICD-10-CM

## 2024-01-06 DIAGNOSIS — M2550 Pain in unspecified joint: Secondary | ICD-10-CM

## 2024-01-06 DIAGNOSIS — F17210 Nicotine dependence, cigarettes, uncomplicated: Secondary | ICD-10-CM

## 2024-01-06 DIAGNOSIS — E559 Vitamin D deficiency, unspecified: Secondary | ICD-10-CM

## 2024-01-06 DIAGNOSIS — Z125 Encounter for screening for malignant neoplasm of prostate: Secondary | ICD-10-CM

## 2024-01-06 DIAGNOSIS — E7800 Pure hypercholesterolemia, unspecified: Secondary | ICD-10-CM | POA: Diagnosis not present

## 2024-01-06 DIAGNOSIS — I5042 Chronic combined systolic (congestive) and diastolic (congestive) heart failure: Secondary | ICD-10-CM

## 2024-01-06 DIAGNOSIS — J432 Centrilobular emphysema: Secondary | ICD-10-CM

## 2024-01-06 DIAGNOSIS — E6609 Other obesity due to excess calories: Secondary | ICD-10-CM

## 2024-01-06 DIAGNOSIS — R7309 Other abnormal glucose: Secondary | ICD-10-CM | POA: Diagnosis not present

## 2024-01-06 DIAGNOSIS — E66811 Obesity, class 1: Secondary | ICD-10-CM

## 2024-01-06 DIAGNOSIS — I110 Hypertensive heart disease with heart failure: Secondary | ICD-10-CM | POA: Diagnosis not present

## 2024-01-06 DIAGNOSIS — Z6832 Body mass index (BMI) 32.0-32.9, adult: Secondary | ICD-10-CM

## 2024-01-06 LAB — POCT URINALYSIS DIPSTICK
Bilirubin, UA: NEGATIVE
Blood, UA: NEGATIVE
Glucose, UA: NEGATIVE
Ketones, UA: NEGATIVE
Leukocytes, UA: NEGATIVE
Nitrite, UA: NEGATIVE
Protein, UA: NEGATIVE
Spec Grav, UA: >=1.03 — AB (ref 1.010–1.025)
Urobilinogen, UA: 0.2 U/dL
pH, UA: 6 (ref 5.0–8.0)

## 2024-01-06 NOTE — Progress Notes (Signed)
 I,Victoria T Basil Lim, CMA,acting as a Neurosurgeon for Smiley Dung, MD.,have documented all relevant documentation on the behalf of Smiley Dung, MD,as directed by  Smiley Dung, MD while in the presence of Smiley Dung, MD.  Subjective:   [name] , male    DOB: 1951/04/09 , 73 y.o.   MRN: [id number]  Chief Complaint  Patient presents with   Annual Exam    Patient presents today for annual exam. He reports compliance with medications. Denies headache, chest pain & sob. EKG completed on 09/24/23 with Dr Belma Boxer.   Hypertension   Hyperlipidemia    HPI Discussed the use of AI scribe software for clinical note transcription with the patient, who gave verbal consent to proceed.  History of Present Illness Terry Andrews is a 73 year old male with hypertension and degenerative joint disease who presents for a physical exam and blood pressure check.  He monitors his blood pressure daily and noted a reading of 113/70 mmHg at home this morning, although it was elevated in the office. He attributes this to smoking a cigarette before the visit. He is currently taking verapamil  twice a day, Aldactone  25 mg daily, and a combination of darbaclor 40/25 mg and atorvastatin  on Monday, Wednesday, and Friday. He has switched from fish oil and vitamin D  to a multivitamin for individuals over 50. He has a half bottle of vitamin D  at home.  He experiences ongoing aches and pains, describing them as 'the same aches and pains' and 'still hurting everywhere.' He attributes these to degenerative joint disease, mentioning a 'degenerate spine,' 'bad knees,' 'bad feet,' and a 'bad shoulder.' He wears a sleeve on his left arm, especially in winter, due to cold and vascular pain. He had a stroke affecting his left side in the past.  He continues to smoke cigarettes and acknowledges the impact on his health, including blood pressure elevation. He is a caregiver for his wife and is concerned about her  well-being if something happens to him. He has discussed plans for her to move to a smaller apartment if he passes away first.  He has a history of seeing a chiropractor for hip issues. He reports that his chiropractor mentioned an 'off the line' spine, which he attributes to sports and physical activity over the years.  He experiences hearing issues, particularly in the left ear. No new concerns reported.   Hypertension This is a chronic problem. The current episode started more than 1 year ago. The problem has been gradually improving since onset. The problem is controlled. Pertinent negatives include no blurred vision, chest pain, palpitations or shortness of breath. Risk factors for coronary artery disease include obesity, sedentary lifestyle, smoking/tobacco exposure and male gender. Past treatments include diuretics and angiotensin blockers. Compliance problems include exercise.      Past Medical History:  Diagnosis Date   COPD (chronic obstructive pulmonary disease) (HCC)    Diabetes mellitus without complication (HCC) type 2    Per pt Borderline/pre-diabetic   GERD (gastroesophageal reflux disease)    H/O: CVA (cerebrovascular accident) 2003   Patient states the only residual deficit is an occasional stutter.   Hyperlipidemia    Hypertension    OSA (obstructive sleep apnea)    wears CPAP q night   Wears partial denture upper      Family History  Problem Relation Age of Onset   Cancer Mother    Stroke Father    Heart disease Father  Current Outpatient Medications:    aspirin  EC 81 MG tablet, Take 81 mg by mouth in the morning., Disp: , Rfl:    atorvastatin  (LIPITOR) 10 MG tablet, TAKE ONE TABLET ONCE DAILY BY MOUTH MONDAY, WEDNESDAY AND FRIDAY WTIH EVENING MEAL, Disp: 36 tablet, Rfl: 2   Azilsartan-Chlorthalidone (EDARBYCLOR ) 40-25 MG TABS, TAKE ONE TABLET BY MOUTH DAILY EVERY MORNING, Disp: 90 tablet, Rfl: 3   potassium chloride  SA (KLOR-CON  M) 20 MEQ tablet,  TAKE 1&1/2 tablets by MOUTH daily, Disp: 135 tablet, Rfl: 2   sildenafil (VIAGRA) 100 MG tablet, , Disp: , Rfl:    spironolactone  (ALDACTONE ) 25 MG tablet, TAKE 1 TABLET BY MOUTH IN THE  MORNING, Disp: 90 tablet, Rfl: 3   tobramycin-dexamethasone  (TOBRADEX) ophthalmic solution, Place 1 drop into the right eye 4 (four) times daily., Disp: , Rfl:    triamcinolone  ointment (KENALOG ) 0.1 %, Apply 1 application  topically 2 (two) times daily as needed (eczema)., Disp: , Rfl:    verapamil  (CALAN -SR) 240 MG CR tablet, TAKE 1 TABLET BY MOUTH TWICE  DAILY, Disp: 180 tablet, Rfl: 3   VITAMIN D  PO, Take 1 capsule by mouth in the morning. (Patient not taking: Reported on 01/06/2024), Disp: , Rfl:    No Known Allergies   Men's preventive visit. Patient Health Questionnaire (PHQ-2) is  Flowsheet Row Clinical Support from 02/25/2023 in [facility]  PHQ-2 Total Score 0  . Patient is on a low sodium diet. Marital status: Single. Relevant history for alcohol use is:  Social History   Substance and Sexual Activity  Alcohol Use Not Currently   Comment: ocassionally  . Relevant history for tobacco use is:  Social History   Tobacco Use  Smoking Status Every Day   Current packs/day: 0.50   Average packs/day: 0.5 packs/day for 60.5 years (30.2 ttl pk-yrs)   Types: Cigarettes   Start date: 1965  Smokeless Tobacco Never  Tobacco Comments   he has cut back number cigs/smoked,   .   Review of Systems  Constitutional: Negative.   HENT: Negative.    Eyes: Negative.  Negative for blurred vision.  Respiratory: Negative.  Negative for shortness of breath.   Cardiovascular: Negative.  Negative for chest pain and palpitations.  Gastrointestinal: Negative.   Endocrine: Negative.   Genitourinary: Negative.   Musculoskeletal:  Positive for arthralgias.  Skin: Negative.   Allergic/Immunologic: Negative.   Neurological: Negative.   Hematological: Negative.    Psychiatric/Behavioral: Negative.       Today's Vitals   01/06/24 0945 01/06/24 1014  BP: (!) 142/88 128/74  Pulse: 83   Temp: 98.6 F (37 C)   SpO2: 98%   Weight: 257 lb 6.4 oz (116.8 kg)   Height: 6' 3 (1.905 m)    Body mass index is 32.17 kg/m.  Wt Readings from Last 3 Encounters:  01/06/24 257 lb 6.4 oz (116.8 kg)  09/24/23 257 lb 3.2 oz (116.7 kg)  07/15/23 250 lb (113.4 kg)    Objective:  Physical Exam Vitals and nursing note reviewed.  Constitutional:      Appearance: Normal appearance. He is obese.  HENT:     Head: Normocephalic and atraumatic.     Right Ear: Tympanic membrane, ear canal and external ear normal.     Left Ear: Ear canal and external ear normal. There is impacted cerumen.     Nose: Nose normal.     Mouth/Throat:     Mouth: Mucous membranes are moist.  Pharynx: Oropharynx is clear.   Eyes:     Extraocular Movements: Extraocular movements intact.     Conjunctiva/sclera: Conjunctivae normal.     Pupils: Pupils are equal, round, and reactive to light.    Cardiovascular:     Rate and Rhythm: Normal rate and regular rhythm.     Pulses: Normal pulses.     Heart sounds: Normal heart sounds.  Pulmonary:     Effort: Pulmonary effort is normal.     Breath sounds: Normal breath sounds.  Chest:  Breasts:    Right: Normal. No swelling, bleeding, inverted nipple, mass or nipple discharge.     Left: Normal. No swelling, bleeding, inverted nipple, mass or nipple discharge.  Abdominal:     General: Abdomen is flat. Bowel sounds are normal.     Palpations: Abdomen is soft.  Genitourinary:    Comments: Deferred   Musculoskeletal:        General: Normal range of motion.     Cervical back: Normal range of motion and neck supple.  Feet:     Comments: He kept shoes on for exam  Skin:    General: Skin is warm.     Comments: rhinophyma   Neurological:     General: No focal deficit present.     Mental Status: He is alert.   Psychiatric:         Mood and Affect: Mood normal.        Behavior: Behavior normal.      Assessment And Plan:    Encounter for general adult medical examination w/o abnormal findings Assessment & Plan: A full exam was performed.  DRE deferred, he is also followed by Urology. He is advised to get 30-45 minutes of regular exercise, no less than four to five days per week. Both weight-bearing and aerobic exercises are recommended.  He is advised to follow a healthy diet with at least six fruits/veggies per day, decrease intake of red meat and other saturated fats and to increase fish intake to twice weekly.  Meats/fish should not be fried -- baked, boiled or broiled is preferable. It is also important to cut back on your sugar intake.  Be sure to read labels - try to avoid anything with added sugar, high fructose corn syrup or other sweeteners.  If you must use a sweetener, you can try stevia or monkfruit.  It is also important to avoid artificially sweetened foods/beverages and diet drinks. Lastly, wear SPF 50 sunscreen on exposed skin and when in direct sunlight for an extended period of time.  Be sure to avoid fast food restaurants and aim for at least 60 ounces of water daily.       Hypertensive heart disease with heart failure [facility]) Assessment & Plan: Chronic, fair control.  Initially uncontrolled.  2023 echocardiogram revealed mildly decreased EF 45-50%. He will continue with Earbyclor 40/25mg  daily, spironolactone  25mg  daily and verapamil  240mg  CR twice daily. He is encouraged to follow low sodium diet. He will f/u in four to six months for re-evaluation.  Blood pressure elevated, possibly due to pre-visit smoking. Normal home reading reported. - Recheck blood pressure during visit. - Advised on smoking effects on blood pressure.  Orders: -     POCT urinalysis dipstick -     Microalbumin / creatinine urine ratio -     CBC -     CMP14+EGFR -     Lipid panel  Chronic combined systolic and diastolic CHF  (congestive heart failure) (HCC) Assessment &  Plan: Most recent echo reviewed. Importance of dietary/medication compliance was discussed with the patient. He is also followed by Cardiology.  - encouraged to follow low sodium diet   Pure hypercholesterolemia Assessment & Plan: Chronic, LDL goal is less than 70 due to aortic atherosclerosis. He will continue with atorvastatin  MWF without any issues.     Left ear impacted cerumen Assessment & Plan: After obtaining verbal consent, left ear was flushed by irrigation. No TM abnormalities were noted. He tolerated procedure well without any complications.     Orders: -     Ear Lavage  Arthralgia, unspecified joint Assessment & Plan: Joint pain possibly due to osteoarthritis or autoimmune condition. Degenerative changes noted. - Order arthritis panel for autoimmune evaluation. - Discuss potential vitamin D  supplementation.  Orders: -     ANA, IFA (with reflex) -     CYCLIC CITRUL PEPTIDE ANTIBODY, IGG/IGA -     Rheumatoid factor -     Sedimentation rate -     Uric acid  Centrilobular emphysema (HCC) Assessment & Plan: Seen on LDCT. He has been evaluated by Pulmonary.  --Continue Albuterol  AS NEEDED for shortness of breath --Reviewed pulmonary function tests. No obstructive defect noted. Per pulmonary, maintenance inhalers not indicated unless you become more symptomatic   Cigarette nicotine dependence without complication Assessment & Plan: Continues smoking, increasing health risks. Agreed to lung cancer screening.  Orders: -     CT CHEST LUNG CANCER SCREENING LOW DOSE WO CONTRAST; Future  Other abnormal glucose Assessment & Plan: Previous labs reviewed, his A1c has been elevated in the past. I will check an A1c today. Reminded to avoid refined sugars including sugary drinks/foods and processed meats including bacon, sausages and deli meats.    Orders: -     CMP14+EGFR -     Hemoglobin A1c  Vitamin D  deficiency  disease -     VITAMIN D  25 Hydroxy (Vit-D Deficiency, Fractures)  Prostate cancer screening -     PSA Total (Reflex To Free)  Class 1 obesity due to excess calories with serious comorbidity and body mass index (BMI) of 32.0 to 32.9 in adult Assessment & Plan: He is encouraged to initially strive for BMI less than 30 to decrease cardiac risk. He is advised to exercise no less than 150 minutes per week.      Return in 6 months (on 07/07/2024), or bp check, for 1 year HM, . Patient was given opportunity to ask questions. Patient verbalized understanding of the plan and was able to repeat key elements of the plan. All questions were answered to their satisfaction.   I, Smiley Dung, MD, have reviewed all documentation for this visit. The documentation on 01/06/24 for the exam, diagnosis, procedures, and orders are all accurate and complete.

## 2024-01-06 NOTE — Patient Instructions (Signed)
 Health Maintenance, Male  Adopting a healthy lifestyle and getting preventive care are important in promoting health and wellness. Ask your health care provider about:  The right schedule for you to have regular tests and exams.  Things you can do on your own to prevent diseases and keep yourself healthy.  What should I know about diet, weight, and exercise?  Eat a healthy diet    Eat a diet that includes plenty of vegetables, fruits, low-fat dairy products, and lean protein.  Do not eat a lot of foods that are high in solid fats, added sugars, or sodium.  Maintain a healthy weight  Body mass index (BMI) is a measurement that can be used to identify possible weight problems. It estimates body fat based on height and weight. Your health care provider can help determine your BMI and help you achieve or maintain a healthy weight.  Get regular exercise  Get regular exercise. This is one of the most important things you can do for your health. Most adults should:  Exercise for at least 150 minutes each week. The exercise should increase your heart rate and make you sweat (moderate-intensity exercise).  Do strengthening exercises at least twice a week. This is in addition to the moderate-intensity exercise.  Spend less time sitting. Even light physical activity can be beneficial.  Watch cholesterol and blood lipids  Have your blood tested for lipids and cholesterol at 71 years of age, then have this test every 5 years.  You may need to have your cholesterol levels checked more often if:  Your lipid or cholesterol levels are high.  You are older than 71 years of age.  You are at high risk for heart disease.  What should I know about cancer screening?  Many types of cancers can be detected early and may often be prevented. Depending on your health history and family history, you may need to have cancer screening at various ages. This may include screening for:  Colorectal cancer.  Prostate cancer.  Skin cancer.  Lung  cancer.  What should I know about heart disease, diabetes, and high blood pressure?  Blood pressure and heart disease  High blood pressure causes heart disease and increases the risk of stroke. This is more likely to develop in people who have high blood pressure readings or are overweight.  Talk with your health care provider about your target blood pressure readings.  Have your blood pressure checked:  Every 3-5 years if you are 71-95 years of age.  Every year if you are 71 years old or older.  If you are between the ages of 29 and 29 and are a current or former smoker, ask your health care provider if you should have a one-time screening for abdominal aortic aneurysm (AAA).  Diabetes  Have regular diabetes screenings. This checks your fasting blood sugar level. Have the screening done:  Once every three years after age 71 if you are at a normal weight and have a low risk for diabetes.  More often and at a younger age if you are overweight or have a high risk for diabetes.  What should I know about preventing infection?  Hepatitis B  If you have a higher risk for hepatitis B, you should be screened for this virus. Talk with your health care provider to find out if you are at risk for hepatitis B infection.  Hepatitis C  Blood testing is recommended for:  Everyone born from [birth year] through 1963.  Anyone  with known risk factors for hepatitis C.  Sexually transmitted infections (STIs)  You should be screened each year for STIs, including gonorrhea and chlamydia, if:  You are sexually active and are younger than 71 years of age.  You are older than 71 years of age and your health care provider tells you that you are at risk for this type of infection.  Your sexual activity has changed since you were last screened, and you are at increased risk for chlamydia or gonorrhea. Ask your health care provider if you are at risk.  Ask your health care provider about whether you are at high risk for HIV. Your health care provider  may recommend a prescription medicine to help prevent HIV infection. If you choose to take medicine to prevent HIV, you should first get tested for HIV. You should then be tested every 3 months for as long as you are taking the medicine.  Follow these instructions at home:  Alcohol use  Do not drink alcohol if your health care provider tells you not to drink.  If you drink alcohol:  Limit how much you have to 0-2 drinks a day.  Know how much alcohol is in your drink. In the [place] one drink equals one 12 oz bottle of beer (355 mL), one 5 oz glass of wine (148 mL), or one 1 oz glass of hard liquor (44 mL).  Lifestyle  Do not use any products that contain nicotine or tobacco. These products include cigarettes, chewing tobacco, and vaping devices, such as e-cigarettes. If you need help quitting, ask your health care provider.  Do not use street drugs.  Do not share needles.  Ask your health care provider for help if you need support or information about quitting drugs.  General instructions  Schedule regular health, dental, and eye exams.  Stay current with your vaccines.  Tell your health care provider if:  You often feel depressed.  You have ever been abused or do not feel safe at home.  Summary  Adopting a healthy lifestyle and getting preventive care are important in promoting health and wellness.  Follow your health care provider's instructions about healthy diet, exercising, and getting tested or screened for diseases.  Follow your health care provider's instructions on monitoring your cholesterol and blood pressure.  This information is not intended to replace advice given to you by your health care provider. Make sure you discuss any questions you have with your health care provider.  Document Revised: 12/09/2020 Document Reviewed: 12/09/2020  Elsevier Patient Education  2024 [employer].

## 2024-01-12 LAB — CBC
Hematocrit: 43.1 % (ref 37.5–51.0)
Hemoglobin: 14.3 g/dL (ref 13.0–17.7)
MCH: 32.7 pg (ref 26.6–33.0)
MCHC: 33.2 g/dL (ref 31.5–35.7)
MCV: 99 fL — ABNORMAL HIGH (ref 79–97)
Platelets: 267 10*3/uL (ref 150–450)
RBC: 4.37 x10E6/uL (ref 4.14–5.80)
RDW: 13.3 % (ref 11.6–15.4)
WBC: 6.6 10*3/uL (ref 3.4–10.8)

## 2024-01-12 LAB — CYCLIC CITRUL PEPTIDE ANTIBODY, IGG/IGA: Cyclic Citrullin Peptide Ab: 5 U (ref 0–19)

## 2024-01-12 LAB — ANTINUCLEAR ANTIBODIES, IFA: ANA Titer 1: NEGATIVE

## 2024-01-12 LAB — CMP14+EGFR
ALT: 25 IU/L (ref 0–44)
AST: 22 IU/L (ref 0–40)
Albumin: 3.7 g/dL — ABNORMAL LOW (ref 3.9–4.9)
Alkaline Phosphatase: 62 IU/L (ref 44–121)
BUN/Creatinine Ratio: 12 (ref 10–24)
BUN: 12 mg/dL (ref 8–27)
Bilirubin Total: 0.2 mg/dL (ref 0.0–1.2)
CO2: 24 mmol/L (ref 20–29)
Calcium: 9.9 mg/dL (ref 8.6–10.2)
Chloride: 102 mmol/L (ref 96–106)
Creatinine, Ser: 0.97 mg/dL (ref 0.76–1.27)
Globulin, Total: 3.3 g/dL (ref 1.5–4.5)
Glucose: 101 mg/dL — ABNORMAL HIGH (ref 70–99)
Potassium: 4 mmol/L (ref 3.5–5.2)
Sodium: 141 mmol/L (ref 134–144)
Total Protein: 7 g/dL (ref 6.0–8.5)
eGFR: 84 mL/min/{1.73_m2} (ref >59)

## 2024-01-12 LAB — MICROALBUMIN / CREATININE URINE RATIO
Creatinine, Urine: 145.3 mg/dL
Microalb/Creat Ratio: 7 mg/g{creat} (ref 0–29)
Microalbumin, Urine: 10.3 ug/mL

## 2024-01-12 LAB — SEDIMENTATION RATE: Sed Rate: 35 mm/h — ABNORMAL HIGH (ref 0–30)

## 2024-01-12 LAB — LIPID PANEL
Chol/HDL Ratio: 2 ratio (ref 0.0–5.0)
Cholesterol, Total: 98 mg/dL — ABNORMAL LOW (ref 100–199)
HDL: 49 mg/dL (ref >39)
LDL Chol Calc (NIH): 37 mg/dL (ref 0–99)
Triglycerides: 44 mg/dL (ref 0–149)
VLDL Cholesterol Cal: 12 mg/dL (ref 5–40)

## 2024-01-12 LAB — PSA TOTAL (REFLEX TO FREE): Prostate Specific Ag, Serum: 1.7 ng/mL (ref 0.0–4.0)

## 2024-01-12 LAB — URIC ACID: Uric Acid: 5.4 mg/dL (ref 3.8–8.4)

## 2024-01-12 LAB — VITAMIN D 25 HYDROXY (VIT D DEFICIENCY, FRACTURES): Vit D, 25-Hydroxy: 76.1 ng/mL (ref 30.0–100.0)

## 2024-01-12 LAB — HEMOGLOBIN A1C
Est. average glucose Bld gHb Est-mCnc: 131 mg/dL
Hgb A1c MFr Bld: 6.2 % — ABNORMAL HIGH (ref 4.8–5.6)

## 2024-01-12 LAB — RHEUMATOID FACTOR: Rheumatoid fact SerPl-aCnc: <10 [IU]/mL (ref <14.0)

## 2024-01-16 DIAGNOSIS — H6122 Impacted cerumen, left ear: Secondary | ICD-10-CM | POA: Insufficient documentation

## 2024-01-16 DIAGNOSIS — Z0000 Encounter for general adult medical examination without abnormal findings: Secondary | ICD-10-CM | POA: Insufficient documentation

## 2024-01-16 NOTE — Assessment & Plan Note (Signed)
 A full exam was performed.  DRE deferred, he is also followed by Urology. He is advised to get 30-45 minutes of regular exercise, no less than four to five days per week. Both weight-bearing and aerobic exercises are recommended.  He is advised to follow a healthy diet with at least six fruits/veggies per day, decrease intake of red meat and other saturated fats and to increase fish intake to twice weekly.  Meats/fish should not be fried -- baked, boiled or broiled is preferable. It is also important to cut back on your sugar intake.  Be sure to read labels - try to avoid anything with added sugar, high fructose corn syrup or other sweeteners.  If you must use a sweetener, you can try stevia or monkfruit.  It is also important to avoid artificially sweetened foods/beverages and diet drinks. Lastly, wear SPF 50 sunscreen on exposed skin and when in direct sunlight for an extended period of time.  Be sure to avoid fast food restaurants and aim for at least 60 ounces of water daily.

## 2024-01-16 NOTE — Assessment & Plan Note (Signed)
 Previous labs reviewed, his A1c has been elevated in the past. I will check an A1c today. Reminded to avoid refined sugars including sugary drinks/foods and processed meats including bacon, sausages and deli meats.

## 2024-01-16 NOTE — Assessment & Plan Note (Signed)
 Chronic, LDL goal is less than 70 due to aortic atherosclerosis. He will continue with atorvastatin MWF without any issues.

## 2024-01-16 NOTE — Assessment & Plan Note (Signed)
 After obtaining verbal consent, left ear was flushed by irrigation. No TM abnormalities were noted. He tolerated procedure well without any complications.

## 2024-01-16 NOTE — Assessment & Plan Note (Signed)
 Continues smoking, increasing health risks. Agreed to lung cancer screening.

## 2024-01-16 NOTE — Assessment & Plan Note (Signed)
 Joint pain possibly due to osteoarthritis or autoimmune condition. Degenerative changes noted. - Order arthritis panel for autoimmune evaluation. - Discuss potential vitamin D  supplementation.

## 2024-01-16 NOTE — Assessment & Plan Note (Addendum)
 Most recent echo reviewed. Importance of dietary/medication compliance was discussed with the patient. He is also followed by Cardiology.  - encouraged to follow low sodium diet

## 2024-01-16 NOTE — Assessment & Plan Note (Addendum)
 Seen on LDCT. He has been evaluated by Pulmonary.  --Continue Albuterol  AS NEEDED for shortness of breath --Reviewed pulmonary function tests. No obstructive defect noted. Per pulmonary, maintenance inhalers not indicated unless you become more symptomatic

## 2024-01-16 NOTE — Assessment & Plan Note (Signed)
 He is encouraged to initially strive for BMI less than 30 to decrease cardiac risk. He is advised to exercise no less than 150 minutes per week.

## 2024-01-16 NOTE — Assessment & Plan Note (Addendum)
 Chronic, fair control.  Initially uncontrolled.  2023 echocardiogram revealed mildly decreased EF 45-50%. He will continue with Earbyclor 40/25mg  daily, spironolactone  25mg  daily and verapamil  240mg  CR twice daily. He is encouraged to follow low sodium diet. He will f/u in four to six months for re-evaluation.  Blood pressure elevated, possibly due to pre-visit smoking. Normal home reading reported. - Recheck blood pressure during visit. - Advised on smoking effects on blood pressure.

## 2024-01-17 ENCOUNTER — Ambulatory Visit
Admission: RE | Admit: 2024-01-17 | Discharge: 2024-01-17 | Disposition: A | Discharge status: Home / Self Care | Source: Ambulatory Visit | Attending: Internal Medicine | Admitting: Internal Medicine

## 2024-01-17 DIAGNOSIS — F17210 Nicotine dependence, cigarettes, uncomplicated: Secondary | ICD-10-CM

## 2024-01-17 DIAGNOSIS — Z87891 Personal history of nicotine dependence: Secondary | ICD-10-CM | POA: Diagnosis not present

## 2024-01-17 DIAGNOSIS — Z122 Encounter for screening for malignant neoplasm of respiratory organs: Secondary | ICD-10-CM | POA: Diagnosis not present

## 2024-02-09 ENCOUNTER — Ambulatory Visit

## 2024-02-09 DIAGNOSIS — Z0000 Encounter for general adult medical examination without abnormal findings: Secondary | ICD-10-CM

## 2024-02-09 NOTE — Progress Notes (Signed)
 Subjective:   Vincenzo V Rehman is a 73 y.o. who presents for a Medicare Wellness preventive visit.  As a reminder, Annual Wellness Visits don't include a physical exam, and some assessments may be limited, especially if this visit is performed virtually. We may recommend an in-person follow-up visit with your provider if needed.  Visit Complete: Virtual I connected with  Jama GAILS Crady on 02/09/24 by a audio enabled telemedicine application and verified that I am speaking with the correct person using two identifiers.  Patient Location: Home  Provider Location: Office/Clinic  I discussed the limitations of evaluation and management by telemedicine. The patient expressed understanding and agreed to proceed.  Vital Signs: Because this visit was a virtual/telehealth visit, some criteria may be missing or patient reported. Any vitals not documented were not able to be obtained and vitals that have been documented are patient reported.  VideoError- Librarian, academic were attempted between this provider and patient, however failed, due to patient having technical difficulties OR patient did not have access to video capability.  We continued and completed visit with audio only.   Persons Participating in Visit: Patient.  AWV Questionnaire: No: Patient Medicare AWV questionnaire was not completed prior to this visit.  Cardiac Risk Factors include: advanced age (>107men, >25 women);hypertension     Objective:    Today's Vitals   02/09/24 1450  PainSc: 4    There is no height or weight on file to calculate BMI.     02/09/2024    2:55 PM 02/25/2023    2:51 PM 02/05/2023   11:37 AM 08/14/2022   10:46 AM 02/19/2022    9:43 AM 05/29/2021    5:45 AM 01/16/2021    8:32 AM  Advanced Directives  Does Patient Have a Medical Advance Directive? No No Yes No No No No  Type of Surveyor, minerals;Living will      Copy of Healthcare Power of  Attorney in Chart?   No - copy requested      Would patient like information on creating a medical advance directive?    No - Patient declined  No - Patient declined     Current Medications (verified) Outpatient Encounter Medications as of 02/09/2024  Medication Sig   aspirin  EC 81 MG tablet Take 81 mg by mouth in the morning.   atorvastatin  (LIPITOR) 10 MG tablet TAKE ONE TABLET ONCE DAILY BY MOUTH MONDAY, WEDNESDAY AND FRIDAY WTIH EVENING MEAL   Azilsartan-Chlorthalidone (EDARBYCLOR ) 40-25 MG TABS TAKE ONE TABLET BY MOUTH DAILY EVERY MORNING   potassium chloride  SA (KLOR-CON  M) 20 MEQ tablet TAKE 1&1/2 tablets by MOUTH daily   sildenafil (VIAGRA) 100 MG tablet    spironolactone  (ALDACTONE ) 25 MG tablet TAKE 1 TABLET BY MOUTH IN THE  MORNING   tobramycin-dexamethasone  (TOBRADEX) ophthalmic solution Place 1 drop into the right eye 4 (four) times daily.   triamcinolone  ointment (KENALOG ) 0.1 % Apply 1 application  topically 2 (two) times daily as needed (eczema).   verapamil  (CALAN -SR) 240 MG CR tablet TAKE 1 TABLET BY MOUTH TWICE  DAILY   VITAMIN D  PO Take 1 capsule by mouth in the morning. (Patient not taking: Reported on 02/09/2024)   No facility-administered encounter medications on file as of 02/09/2024.    Allergies (verified) Patient has no known allergies.   History: Past Medical History:  Diagnosis Date   COPD (chronic obstructive pulmonary disease) (HCC)    Diabetes mellitus without complication (HCC)  type 2    Per pt Borderline/pre-diabetic   GERD (gastroesophageal reflux disease)    H/O: CVA (cerebrovascular accident) 2003   Patient states the only residual deficit is an occasional stutter.   Hyperlipidemia    Hypertension    OSA (obstructive sleep apnea)    wears CPAP q night   Wears partial denture upper    Past Surgical History:  Procedure Laterality Date   ANAL FISTULOTOMY N/A 05/29/2021   Procedure: WIDE EXCISION OF HYDRADINITIS;  Surgeon: Debby Hila, MD;   Location: [facility];  Service: General;  Laterality: N/A;   COLONOSCOPY  12/21/2019   HYDROCELE EXCISION Right 02/05/2023   Procedure: OPEN HYDROCELECTOMY ADULT;  Surgeon: Alvaro Ricardo KATHEE Mickey., MD;  Location[facility];  Service: Urology;  Laterality: Right;   LEFT HEART CATH AND CORONARY ANGIOGRAPHY N/A 08/14/2022   Procedure: LEFT HEART CATH AND CORONARY ANGIOGRAPHY;  Surgeon: Ladona Heinz, MD;  Location: [facility];  Service: Cardiovascular;  Laterality: N/A;   ORCHIOPEXY Right 02/05/2023   Procedure: ORCHIOPEXY ADULT;  Surgeon: Alvaro Ricardo KATHEE Mickey., MD;  Location[facility];  Service: Urology;  Laterality: Right;   Family History  Problem Relation Age of Onset   Cancer Mother    Stroke Father    Heart disease Father    Social History   Socioeconomic History   Marital status: Single    Spouse name: Not on file   Number of children: 2   Years of education: College   Highest education level: Not on file  Occupational History   Occupation: retired  Tobacco Use   Smoking status: Every Day    Current packs/day: 0.50    Average packs/day: 0.5 packs/day for 60.5 years (30.3 ttl pk-yrs)    Types: Cigarettes    Start date: 1965   Smokeless tobacco: Never   Tobacco comments:    he has cut back number cigs/smoked,   Vaping Use   Vaping status: Never Used  Substance and Sexual Activity   Alcohol use: Not Currently    Comment: ocassionally   Drug use: Yes    Types: Marijuana    Comment: every once in a while, (as of 05/22/21 patient last smoked marijuana yeterday 6-13-2024una on 05/21/2021)   Sexual activity: Yes  Other Topics Concern   Not on file  Social History Narrative   Some caffeine use    Social Drivers of Corporate investment banker Strain: Low Risk  (02/09/2024)   Overall Financial Resource Strain (CARDIA)    Difficulty of Paying Living Expenses: Not hard at all  Food Insecurity: No Food Insecurity (02/09/2024)    Hunger Vital Sign    Worried About Running Out of Food in the Last Year: Never true    Ran Out of Food in the Last Year: Never true  Transportation Needs: No Transportation Needs (02/09/2024)   PRAPARE - Administrator, Civil Service (Medical): No    Lack of Transportation (Non-Medical): No  Physical Activity: Inactive (02/09/2024)   Exercise Vital Sign    Days of Exercise per Week: 0 days    Minutes of Exercise per Session: 0 min  Stress: No Stress Concern Present (02/09/2024)   [employer] of Occupational Health - Occupational Stress Questionnaire    Feeling of Stress: Not at all  Social Connections: Moderately Isolated (02/09/2024)   Social Connection and Isolation Panel    Frequency of Communication with Friends and Family: Three times a  week    Frequency of Social Gatherings with Friends and Family: Never    Attends Religious Services: Never    Database administrator or Organizations: No    Attends Engineer, structural: Never    Marital Status: Married    Tobacco Counseling Ready to quit: Not Answered Counseling given: Not Answered Tobacco comments: he has cut back number cigs/smoked,     Clinical Intake:  Pre-visit preparation completed: Yes  Pain : 0-10 Pain Score: 4  Pain Type: Chronic pain Pain Location: Generalized Pain Descriptors / Indicators: Aching Pain Onset: More than a month ago Pain Frequency: Constant     Nutritional Risks: None Diabetes: No  Lab Results  Component Value Date   HGBA1C 6.2 (H) 01/06/2024   HGBA1C 6.3 (H) 07/06/2023   HGBA1C 6.2 (H) 12/31/2022     How often do you need to have someone help you when you read instructions, pamphlets, or other written materials from your doctor or pharmacy?: 1 - Never  Interpreter Needed?: No  Information entered by :: NAllen LPN   Activities of Daily Living     02/09/2024    2:51 PM 02/25/2023    2:43 PM  In your present state of health, do you have any difficulty  performing the following activities:  Hearing? 0 0  Vision? 0 0  Difficulty concentrating or making decisions? 0 0  Walking or climbing stairs? 1 1  Dressing or bathing? 0 0  Doing errands, shopping? 0 0  Preparing Food and eating ? N N  Using the Toilet? N N  In the past six months, have you accidently leaked urine? N N  Do you have problems with loss of bowel control? N N  Managing your Medications? N N  Managing your Finances? N N  Housekeeping or managing your Housekeeping? N N    Patient Care Team: Jarold Medici, MD as PCP - General (Internal Medicine) Ladona Heinz, MD as PCP - Cardiology (Cardiology)  I have updated your Care Teams any recent Medical Services you may have received from other providers in the past year.     Assessment:   This is a routine wellness examination for [employer].  Hearing/Vision screen Hearing Screening - Comments:: Denies hearing issues Vision Screening - Comments:: Regular eye exams, Vision Source   Goals Addressed             This Visit's Progress    Patient Stated       02/09/2024, stay alive       Depression Screen     02/09/2024    2:56 PM 02/25/2023    2:53 PM 02/19/2022    9:44 AM 01/16/2021    8:33 AM 01/11/2020    9:09 AM 03/08/2019    9:14 AM 01/10/2019    9:05 AM  PHQ 2/9 Scores  PHQ - 2 Score 0 0 0 0 0 0 0  PHQ- 9 Score 1 0   0 0 0    Fall Risk     02/09/2024    2:55 PM 01/06/2024    9:54 AM 02/25/2023    2:53 PM 02/19/2022    9:44 AM 01/16/2021    8:33 AM  Fall Risk   Falls in the past year? 0 0 0 0 0  Number falls in past yr: 0 0 0 0   Injury with Fall? 0 0 0 0   Risk for fall due to : Medication side effect No Fall Risks Medication side effect  Medication side effect Medication side effect  Follow up Falls evaluation completed;Falls prevention discussed Falls evaluation completed Falls prevention discussed;Falls evaluation completed Falls evaluation completed;Education provided;Falls prevention discussed  Falls evaluation  completed;Education provided;Falls prevention discussed      Data saved with a previous flowsheet row definition    MEDICARE RISK AT HOME:  Medicare Risk at Home Any stairs in or around the home?: Yes If so, are there any without handrails?: No Home free of loose throw rugs in walkways, pet beds, electrical cords, etc?: Yes Adequate lighting in your home to reduce risk of falls?: Yes Life alert?: No Use of a cane, walker or w/c?: No Grab bars in the bathroom?: No Shower chair or bench in shower?: No Elevated toilet seat or a handicapped toilet?: Yes  TIMED UP AND GO:  Was the test performed?  No  Cognitive Function: 6CIT completed        02/09/2024    2:56 PM 02/25/2023    2:54 PM 02/19/2022    9:45 AM 01/16/2021    8:35 AM 01/11/2020    9:10 AM  6CIT Screen  What Year? 0 points 0 points 0 points 0 points 0 points  What month? 0 points 0 points 0 points 0 points 0 points  What time? 0 points 0 points 0 points 0 points 0 points  Count back from 20 0 points 0 points 0 points 0 points 0 points  Months in reverse 0 points 0 points 0 points 0 points 0 points  Repeat phrase 0 points 0 points 6 points 6 points 4 points  Total Score 0 points 0 points 6 points 6 points 4 points    Immunizations Immunization History  Administered Date(s) Administered   PFIZER(Purple Top)SARS-COV-2 Vaccination 08/22/2019, 09/12/2019, 05/07/2020, 12/20/2020   Pfizer Covid-19 Vaccine Bivalent Booster 5y-11y 06/23/2021   Tdap 02/18/2017    Screening Tests Health Maintenance  Topic Date Due   COVID-19 Vaccine (6 - 2024-25 season) 04/04/2023   Zoster Vaccines- Shingrix (1 of 2) 04/07/2024 (Originally 06/02/2003)   Pneumococcal Vaccine: 50+ Years (1 of 2 - PCV) 01/05/2025 (Originally 06/01/1970)   INFLUENZA VACCINE  03/03/2024   Lung Cancer Screening  01/16/2025   Medicare Annual Wellness (AWV)  02/08/2025   DTaP/Tdap/Td (2 - Td or Tdap) 02/19/2027   Colonoscopy  12/20/2029   Hepatitis C  Screening  Completed   Hepatitis B Vaccines  Aged Out   HPV VACCINES  Aged Out   Meningococcal B Vaccine  Aged Out    Health Maintenance  Health Maintenance Due  Topic Date Due   COVID-19 Vaccine (6 - 2024-25 season) 04/04/2023   Health Maintenance Items Addressed: Up to date  Additional Screening:  Vision Screening: Recommended annual ophthalmology exams for early detection of glaucoma and other disorders of the eye. Would you like a referral to an eye doctor? No    Dental Screening: Recommended annual dental exams for proper oral hygiene  Community Resource Referral / Chronic Care Management: CRR required this visit?  No   CCM required this visit?  No   Plan:    I have personally reviewed and noted the following in the patient's chart:   Medical and social history Use of alcohol, tobacco or illicit drugs  Current medications and supplements including opioid prescriptions. Patient is not currently taking opioid prescriptions. Functional ability and status Nutritional status Physical activity Advanced directives List of other physicians Hospitalizations, surgeries, and ER visits in previous 12 months Vitals Screenings to include cognitive, depression,  and falls Referrals and appointments  In addition, I have reviewed and discussed with patient certain preventive protocols, quality metrics, and best practice recommendations. A written personalized care plan for preventive services as well as general preventive health recommendations were provided to patient.   Ardella FORBES Dawn, LPN   2/[id number]/7974   After Visit Summary: (Pick Up) Due to this being a telephonic visit, with patients personalized plan was offered to patient and patient has requested to Pick up at office.  Notes: Nothing significant to report at this time.

## 2024-02-09 NOTE — Patient Instructions (Signed)
 [name] , Thank you for taking time out of your busy schedule to complete your Annual Wellness Visit with me. I enjoyed our conversation and look forward to speaking with you again next year. I, as well as your care team,  appreciate your ongoing commitment to your health goals. Please review the following plan we discussed and let me know if I can assist you in the future. Your Game plan/ To Do List    Referrals: If you haven't heard from the office you've been referred to, please reach out to them at the phone provided.  N/a Follow up Visits: Next Medicare AWV with our clinical staff: office will schedule   Have you seen your provider in the last 6 months (3 months if uncontrolled diabetes)? Yes Next Office Visit with your provider: 07/10/2024 at 10:40  Clinician Recommendations:  Aim for 30 minutes of exercise or brisk walking, 6-8 glasses of water, and 5 servings of fruits and vegetables each day.       This is a list of the screening recommended for you and due dates:  Health Maintenance  Topic Date Due   COVID-19 Vaccine (6 - 2024-25 season) 04/04/2023   Zoster (Shingles) Vaccine (1 of 2) 04/07/2024*   Pneumococcal Vaccine for age over 93 (1 of 2 - PCV) 01/05/2025*   Flu Shot  03/03/2024   Screening for Lung Cancer  01/16/2025   Medicare Annual Wellness Visit  02/08/2025   DTaP/Tdap/Td vaccine (2 - Td or Tdap) 02/19/2027   Colon Cancer Screening  12/20/2029   Hepatitis C Screening  Completed   Hepatitis B Vaccine  Aged Out   HPV Vaccine  Aged Out   Meningitis B Vaccine  Aged Out  *Topic was postponed. The date shown is not the original due date.    Advanced directives: (Declined) Advance directive discussed with you today. Even though you declined this today, please call our office should you change your mind, and we can give you the proper paperwork for you to fill out. Advance Care Planning is important because it:  [x]  Makes sure you receive the medical care that is  consistent with your values, goals, and preferences  [x]  It provides guidance to your family and loved ones and reduces their decisional burden about whether or not they are making the right decisions based on your wishes.  Follow the link provided in your after visit summary or read over the paperwork we have mailed to you to help you started getting your Advance Directives in place. If you need assistance in completing these, please reach out to us  so that we can help you!  See attachments for Preventive Care and Fall Prevention Tips.

## 2024-02-16 ENCOUNTER — Ambulatory Visit

## 2024-04-24 DIAGNOSIS — L2089 Other atopic dermatitis: Secondary | ICD-10-CM | POA: Diagnosis not present

## 2024-04-24 DIAGNOSIS — L810 Postinflammatory hyperpigmentation: Secondary | ICD-10-CM | POA: Diagnosis not present

## 2024-06-11 ENCOUNTER — Other Ambulatory Visit: Payer: Self-pay | Admitting: Cardiology

## 2024-06-20 ENCOUNTER — Telehealth: Payer: Self-pay | Admitting: Cardiology

## 2024-06-20 ENCOUNTER — Other Ambulatory Visit (INDEPENDENT_AMBULATORY_CARE_PROVIDER_SITE_OTHER)

## 2024-06-20 ENCOUNTER — Ambulatory Visit: Admitting: Orthopaedic Surgery

## 2024-06-20 ENCOUNTER — Other Ambulatory Visit

## 2024-06-20 VITALS — Ht 74.0 in | Wt 251.4 lb

## 2024-06-20 DIAGNOSIS — M25561 Pain in right knee: Secondary | ICD-10-CM | POA: Diagnosis not present

## 2024-06-20 DIAGNOSIS — M170 Bilateral primary osteoarthritis of knee: Secondary | ICD-10-CM

## 2024-06-20 DIAGNOSIS — M1711 Unilateral primary osteoarthritis, right knee: Secondary | ICD-10-CM

## 2024-06-20 DIAGNOSIS — M25562 Pain in left knee: Secondary | ICD-10-CM | POA: Diagnosis not present

## 2024-06-20 DIAGNOSIS — M1712 Unilateral primary osteoarthritis, left knee: Secondary | ICD-10-CM | POA: Insufficient documentation

## 2024-06-20 NOTE — Telephone Encounter (Signed)
 Paper Work Dropped Off: [facility] preop clearance  Date:06/20/24  Location of paper:  faxed to preop

## 2024-06-20 NOTE — Telephone Encounter (Signed)
   Pre-operative Risk Assessment    Patient Name: Terry Andrews  DOB: 06/20/1951 MRN: [id number]   Date of last office visit: 09/24/23 DR. LADONA Date of next office visit: NONE   Request for Surgical Clearance    Procedure:  RIGHT TOTAL KNEE ARTHROPLASTY  Date of Surgery:  Clearance TBD                                Surgeon:  DR. KAY OZELL Cummins Surgeon's Group or Practice Name:  [facility] Phone number:  [phone] Fax number:  [phone]   Type of Clearance Requested:   - Medical  - Pharmacy:  Hold Aspirin      Type of Anesthesia:  Not Indicated (GENERAL ?)   Additional requests/questions:    Bonney Niels Jest   06/20/2024, 3:34 PM

## 2024-06-20 NOTE — Telephone Encounter (Signed)
 Clearance still has not been input into patient's chart will keep an eye on our fax for clearance

## 2024-06-20 NOTE — Addendum Note (Signed)
 Addended by: Jonavin Seder on: 06/20/2024 03:59 PM   Modules accepted: Orders

## 2024-06-20 NOTE — Telephone Encounter (Signed)
   Name: Terry Andrews  DOB: 1951-07-25  MRN: [id number]  Primary Cardiologist: Gordy Bergamo, MD   Preoperative team, please contact this patient and set up a phone call appointment for further preoperative risk assessment. Please obtain consent and complete medication review. Thank you for your help.  I confirm that guidance regarding antiplatelet and oral anticoagulation therapy has been completed and, if necessary, noted below.  Patient's aspirin  is prescribed for history of CVA.  Recommendations for holding medication will need to come from prescribing provider.  I also confirmed the patient resides in the state of [place] . As per [facility] Medical Board telemedicine laws, the patient must reside in the state in which the provider is licensed.   [name], NP 06/20/2024, 3:58 PM [facility]

## 2024-06-20 NOTE — Telephone Encounter (Signed)
 Left message to call back to schedule tele pre op appt.

## 2024-06-20 NOTE — Progress Notes (Signed)
 Office Visit Note   Patient: Terry Andrews           Date of Birth: [birth year]           MRN: [id number] Visit Date: 06/20/2024              Requested by: Jarold Medici, MD [address] [place],  [place] [address] PCP: Jarold Medici, MD   Assessment & Plan: Visit Diagnoses:  1. Primary osteoarthritis of right knee   2. Primary osteoarthritis of left knee     Plan: History of Present Illness Terry Andrews is a 73 year old male with severe bilateral knee arthritis who presents for evaluation of worsening knee pain and consideration for knee replacement surgery.  He experiences pain in both knees, with the right knee being more severe. 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 He has difficulty with mobility, especially when entering a vehicle and moving around his backyard, as he cannot initiate movement with his right foot.  He smokes half a pack of cigarettes daily and takes pantoprazole three to five days a week. He lives with his fiance and is concerned about maintaining his health to fulfill his caregiving responsibilities.  Results RADIOLOGY Knee X-ray: Severe osteoarthritis in both knees, right knee worse  Assessment and Plan Bilateral primary knee osteoarthritis Severe osteoarthritis in both knees, right worse than left. X-rays confirm readiness for knee replacement surgery. - Obtain surgical clearance from Dr. Jarold and Dr. Ladona for right knee arthroplasty. - Schedule right total knee arthroplasty for next year. - Arrange outpatient physical therapy post-surgery, 2-3 times a week, with home exercises.  Impression is severe right knee degenerative joint disease secondary to Osteoarthritis.  Patient has attempted conservative treatment for at least 6 consecutive weeks within the past 12 weeks, including but not limited to physical therapy, home exercise program,  NSAIDs, activity modification, and/or corticosteroid injections. Despite these efforts, symptoms have not improved or have worsened. Conservative measures have been deemed unsuccessful at this time. After a detailed discussion covering diagnosis and treatment options--including the risks, benefits, alternatives, and potential complications of surgical and nonsurgical management--the patient elected to proceed with surgery  Anticoagulants: No antithrombotic Postop anticoagulation: Eliquis Diabetic: No  Nickel allergy: No Prior DVT/PE: No Tobacco use: Yes Clearances needed for surgery: PCP, cardiology Anticipated discharge dispo: outpatient vs 23 hr obs, would like to go to outpatient PT   Planned right total knee arthroplasty Right knee osteoarthritis severe, necessitating total knee arthroplasty. Discussed potential for same-day discharge and outpatient physical therapy for faster rehabilitation. - Obtain surgical clearance from Dr. Jarold and Dr. Ladona. - Schedule right total knee arthroplasty for next year. - Arrange outpatient physical therapy post-surgery.  Planned left knee viscosupplementation Left knee osteoarthritis managed with viscosupplementation. Interested in gel injections as alternative to cortisone. Insurance approval required. - [employer] approval for left knee viscosupplementation. - Administer gel injection in left knee once approved.  This patient is diagnosed with osteoarthritis of the knee(s).    Radiographs show evidence of joint space narrowing, osteophytes, subchondral sclerosis and/or subchondral cysts.  This patient has knee pain which interferes with functional and activities of daily living.    This patient has experienced inadequate response, adverse effects and/or intolerance with conservative treatments such as acetaminophen , NSAIDS, topical creams, physical therapy or regular exercise, knee bracing and/or weight loss.   This patient has  experienced inadequate response or has a contraindication to intra articular steroid injections for at least 3 months.   This patient is not scheduled to have a total knee replacement within 6 months of starting treatment with viscosupplementation.  Follow-Up Instructions: No follow-ups on file.   Orders:  Orders Placed This Encounter  Procedures   XR KNEE 3 VIEW RIGHT   XR KNEE 3 VIEW LEFT   No orders of the defined types were placed in this encounter.     Procedures: No procedures performed   Clinical Data: No additional findings.   Subjective: Chief Complaint  Patient presents with   Right Knee - Pain   Left Knee - Pain    HPI  Review of Systems  Constitutional: Negative.   HENT: Negative.    Eyes: Negative.   Respiratory: Negative.    Cardiovascular: Negative.   Gastrointestinal: Negative.   Endocrine: Negative.   Genitourinary: Negative.   Skin: Negative.   Allergic/Immunologic: Negative.   Neurological: Negative.   Hematological: Negative.   Psychiatric/Behavioral: Negative.    All other systems reviewed and are negative.    Objective: Vital Signs: Ht 6' 2 (1.88 m)   Wt 251 lb 6.4 oz (114 kg)   BMI 32.28 kg/m   Physical Exam Vitals and nursing note reviewed.  Constitutional:      Appearance: He is well-developed.  HENT:     Head: Normocephalic and atraumatic.  Eyes:     Pupils: Pupils are equal, round, and reactive to light.  Pulmonary:     Effort: Pulmonary effort is normal.  Abdominal:     Palpations: Abdomen is soft.  Musculoskeletal:        General: Normal range of motion.     Cervical back: Neck supple.  Skin:    General: Skin is warm.  Neurological:     Mental Status: He is alert and oriented to person, place, and time.  Psychiatric:        Behavior: Behavior normal.        Thought Content: Thought content normal.        Judgment: Judgment normal.     Ortho Exam  Specialty Comments:  No specialty comments  available.  Imaging: XR KNEE 3 VIEW RIGHT Result Date: 06/20/2024 X-rays demonstrate severe tricompartmental osteoarthritis.  Bone-on-bone joint space narrowing.  Kellgren-Lawrence stage IV  XR KNEE 3 VIEW LEFT Result Date: 06/20/2024 X-rays of the left knee show advanced tricompartmental osteoarthritis.  Bone-on-bone joint space narrowing.  Kellgren-Lawrence stage IV    PMFS History: Patient Active Problem List   Diagnosis Date Noted   Primary osteoarthritis of left knee 06/20/2024   Encounter for general adult medical examination w/o abnormal findings 01/16/2024   Left ear impacted cerumen 01/16/2024   Arthralgia 01/06/2024   Pure hypercholesterolemia 07/06/2023   Chronic combined systolic and diastolic CHF (congestive heart failure) (HCC) 07/06/2023   Chronic pain of both knees 07/06/2023   Atherosclerosis of aorta 12/31/2022   Atherosclerosis of native coronary artery of native heart without angina pectoris 12/31/2022   Cigarette nicotine dependence without complication 12/31/2022   Centrilobular emphysema (HCC) 09/09/2022   Dyspnea on exertion 08/14/2022   Abnormal stress ECG 08/14/2022   Hypertensive heart disease with heart failure (HCC) 08/14/2022   Hypertensive heart disease without CHF 08/14/2022   Hypertensive heart disease without heart failure 04/22/2021   Other abnormal glucose 04/22/2021   Class 1 obesity due to excess calories with serious comorbidity and body mass index (BMI) of 32.0 to 32.9  in adult 04/22/2021   Obstructive sleep apnea treated with continuous positive airway pressure (CPAP) 05/05/2017   Past Medical History:  Diagnosis Date   COPD (chronic obstructive pulmonary disease) (HCC)    Diabetes mellitus without complication (HCC) type 2    Per pt Borderline/pre-diabetic   GERD (gastroesophageal reflux disease)    H/O: CVA (cerebrovascular accident) 2003   Patient states the only residual deficit is an occasional stutter.   Hyperlipidemia     Hypertension    OSA (obstructive sleep apnea)    wears CPAP q night   Wears partial denture upper     Family History  Problem Relation Age of Onset   Cancer Mother    Stroke Father    Heart disease Father     Past Surgical History:  Procedure Laterality Date   ANAL FISTULOTOMY N/A 05/29/2021   Procedure: WIDE EXCISION OF HYDRADINITIS;  Surgeon: Debby Hila, MD;  Location: [facility];  Service: General;  Laterality: N/A;   COLONOSCOPY  12/21/2019   HYDROCELE EXCISION Right 02/05/2023   Procedure: OPEN HYDROCELECTOMY ADULT;  Surgeon: Alvaro Ricardo KATHEE Mickey., MD;  Location: [facility];  Service: Urology;  Laterality: Right;   LEFT HEART CATH AND CORONARY ANGIOGRAPHY N/A 08/14/2022   Procedure: LEFT HEART CATH AND CORONARY ANGIOGRAPHY;  Surgeon: Ladona Heinz, MD;  Location: [facility];  Service: Cardiovascular;  Laterality: N/A;   ORCHIOPEXY Right 02/05/2023   Procedure: ORCHIOPEXY ADULT;  Surgeon: Alvaro Ricardo KATHEE Mickey., MD;  Location[facility];  Service: Urology;  Laterality: Right;   Social History   Occupational History   Occupation: retired  Tobacco Use   Smoking status: Every Day    Current packs/day: 0.50    Average packs/day: 0.5 packs/day for 60.9 years (30.4 ttl pk-yrs)    Types: Cigarettes    Start date: 1965   Smokeless tobacco: Never   Tobacco comments:    he has cut back number cigs/smoked,   Vaping Use   Vaping status: Never Used  Substance and Sexual Activity   Alcohol use: Not Currently    Comment: ocassionally   Drug use: Yes    Types: Marijuana    Comment: every once in a while, (as of 05/22/21 patient last smoked marijuana yeterday 6-13-2024una on 05/21/2021)   Sexual activity: Yes

## 2024-06-21 ENCOUNTER — Telehealth (HOSPITAL_BASED_OUTPATIENT_CLINIC_OR_DEPARTMENT_OTHER): Payer: Self-pay | Admitting: *Deleted

## 2024-06-21 NOTE — Telephone Encounter (Signed)
 Pt has been scheduled for tele preop appt 07/31/24 per pt as surgery not until Jan or Feb 2026. I did tell the pt that the clearance we provide will only be good x 2 months, so this will give surgeon until 2/29/26. Pt understands.   Med rec and consent are done.      Patient Consent for Virtual Visit        Terry Andrews has provided verbal consent on 06/21/2024 for a virtual visit (video or telephone).   CONSENT FOR VIRTUAL VISIT FOR:  Terry Andrews  By participating in this virtual visit I agree to the following:  I hereby voluntarily request, consent and authorize [facility] and its employed or contracted physicians, physician assistants, nurse practitioners or other licensed health care professionals (the Practitioner), to provide me with telemedicine health care services (the "Services) as deemed necessary by the treating Practitioner. I acknowledge and consent to receive the Services by the Practitioner via telemedicine. I understand that the telemedicine visit will involve communicating with the Practitioner through live audiovisual communication technology and the disclosure of certain medical information by electronic transmission. I acknowledge that I have been given the opportunity to request an in-person assessment or other available alternative prior to the telemedicine visit and am voluntarily participating in the telemedicine visit.  I understand that I have the right to withhold or withdraw my consent to the use of telemedicine in the course of my care at any time, without affecting my right to future care or treatment, and that the Practitioner or I may terminate the telemedicine visit at any time. I understand that I have the right to inspect all information obtained and/or recorded in the course of the telemedicine visit and may receive copies of available information for a reasonable fee.  I understand that some of the potential risks of receiving the Services via  telemedicine include:  Delay or interruption in medical evaluation due to technological equipment failure or disruption; Information transmitted may not be sufficient (e.g. poor resolution of images) to allow for appropriate medical decision making by the Practitioner; and/or  In rare instances, security protocols could fail, causing a breach of personal health information.  Furthermore, I acknowledge that it is my responsibility to provide information about my medical history, conditions and care that is complete and accurate to the best of my ability. I acknowledge that Practitioner's advice, recommendations, and/or decision may be based on factors not within their control, such as incomplete or inaccurate data provided by me or distortions of diagnostic images or specimens that may result from electronic transmissions. I understand that the practice of medicine is not an exact science and that Practitioner makes no warranties or guarantees regarding treatment outcomes. I acknowledge that a copy of this consent can be made available to me via my patient portal [facility] MyChart), or I can request a printed copy by calling the office of [facility].    I understand that my insurance will be billed for this visit.   I have read or had this consent read to me. I understand the contents of this consent, which adequately explains the benefits and risks of the Services being provided via telemedicine.  I have been provided ample opportunity to ask questions regarding this consent and the Services and have had my questions answered to my satisfaction. I give my informed consent for the services to be provided through the use of telemedicine in my medical care

## 2024-06-21 NOTE — Telephone Encounter (Signed)
 Pt returning call

## 2024-06-21 NOTE — Telephone Encounter (Signed)
 Pt has been scheduled for tele preop appt 07/31/24 per pt as surgery not until Jan or Feb 2026. I did tell the pt that the clearance we provide will only be good x 2 months, so this will give surgeon until 2/29/26. Pt understands.   Med rec and consent are done.

## 2024-07-07 NOTE — Patient Instructions (Signed)
 Hypertension, Adult High blood pressure (hypertension) is when the force of blood pumping through the arteries is too strong. The arteries are the blood vessels that carry blood from the heart throughout the body. Hypertension forces the heart to work harder to pump blood and may cause arteries to become narrow or stiff. Untreated or uncontrolled hypertension can lead to a heart attack, heart failure, a stroke, kidney disease, and other problems. A blood pressure reading consists of a higher number over a lower number. Ideally, your blood pressure should be below 120/80. The first ("top") number is called the systolic pressure. It is a measure of the pressure in your arteries as your heart beats. The second ("bottom") number is called the diastolic pressure. It is a measure of the pressure in your arteries as the heart relaxes. What are the causes? The exact cause of this condition is not known. There are some conditions that result in high blood pressure. What increases the risk? Certain factors may make you more likely to develop high blood pressure. Some of these risk factors are under your control, including: Smoking. Not getting enough exercise or physical activity. Being overweight. Having too much fat, sugar, calories, or salt (sodium) in your diet. Drinking too much alcohol. Other risk factors include: Having a personal history of heart disease, diabetes, high cholesterol, or kidney disease. Stress. Having a family history of high blood pressure and high cholesterol. Having obstructive sleep apnea. Age. The risk increases with age. What are the signs or symptoms? High blood pressure may not cause symptoms. Very high blood pressure (hypertensive crisis) may cause: Headache. Fast or irregular heartbeats (palpitations). Shortness of breath. Nosebleed. Nausea and vomiting. Vision changes. Severe chest pain, dizziness, and seizures. How is this diagnosed? This condition is diagnosed by  measuring your blood pressure while you are seated, with your arm resting on a flat surface, your legs uncrossed, and your feet flat on the floor. The cuff of the blood pressure monitor will be placed directly against the skin of your upper arm at the level of your heart. Blood pressure should be measured at least twice using the same arm. Certain conditions can cause a difference in blood pressure between your right and left arms. If you have a high blood pressure reading during one visit or you have normal blood pressure with other risk factors, you may be asked to: Return on a different day to have your blood pressure checked again. Monitor your blood pressure at home for 1 week or longer. If you are diagnosed with hypertension, you may have other blood or imaging tests to help your health care provider understand your overall risk for other conditions. How is this treated? This condition is treated by making healthy lifestyle changes, such as eating healthy foods, exercising more, and reducing your alcohol intake. You may be referred for counseling on a healthy diet and physical activity. Your health care provider may prescribe medicine if lifestyle changes are not enough to get your blood pressure under control and if: Your systolic blood pressure is above 130. Your diastolic blood pressure is above 80. Your personal target blood pressure may vary depending on your medical conditions, your age, and other factors. Follow these instructions at home: Eating and drinking  Eat a diet that is high in fiber and potassium, and low in sodium, added sugar, and fat. An example of this eating plan is called the DASH diet. DASH stands for Dietary Approaches to Stop Hypertension. To eat this way: Eat  plenty of fresh fruits and vegetables. Try to fill one half of your plate at each meal with fruits and vegetables. Eat whole grains, such as whole-wheat pasta, brown rice, or whole-grain bread. Fill about one  fourth of your plate with whole grains. Eat or drink low-fat dairy products, such as skim milk or low-fat yogurt. Avoid fatty cuts of meat, processed or cured meats, and poultry with skin. Fill about one fourth of your plate with lean proteins, such as fish, chicken without skin, beans, eggs, or tofu. Avoid pre-made and processed foods. These tend to be higher in sodium, added sugar, and fat. Reduce your daily sodium intake. Many people with hypertension should eat less than 1,500 mg of sodium a day. Do not drink alcohol if: Your health care provider tells you not to drink. You are pregnant, may be pregnant, or are planning to become pregnant. If you drink alcohol: Limit how much you have to: 0-1 drink a day for women. 0-2 drinks a day for men. Know how much alcohol is in your drink. In the [place] one drink equals one 12 oz bottle of beer (355 mL), one 5 oz glass of wine (148 mL), or one 1 oz glass of hard liquor (44 mL). Lifestyle  Work with your health care provider to maintain a healthy body weight or to lose weight. Ask what an ideal weight is for you. Get at least 30 minutes of exercise that causes your heart to beat faster (aerobic exercise) most days of the week. Activities may include walking, swimming, or biking. Include exercise to strengthen your muscles (resistance exercise), such as Pilates or lifting weights, as part of your weekly exercise routine. Try to do these types of exercises for 30 minutes at least 3 days a week. Do not use any products that contain nicotine or tobacco. These products include cigarettes, chewing tobacco, and vaping devices, such as e-cigarettes. If you need help quitting, ask your health care provider. Monitor your blood pressure at home as told by your health care provider. Keep all follow-up visits. This is important. Medicines Take over-the-counter and prescription medicines only as told by your health care provider. Follow directions carefully. Blood  pressure medicines must be taken as prescribed. Do not skip doses of blood pressure medicine. Doing this puts you at risk for problems and can make the medicine less effective. Ask your health care provider about side effects or reactions to medicines that you should watch for. Contact a health care provider if you: Think you are having a reaction to a medicine you are taking. Have headaches that keep coming back (recurring). Feel dizzy. Have swelling in your ankles. Have trouble with your vision. Get help right away if you: Develop a severe headache or confusion. Have unusual weakness or numbness. Feel faint. Have severe pain in your chest or abdomen. Vomit repeatedly. Have trouble breathing. These symptoms may be an emergency. Get help right away. Call 911. Do not wait to see if the symptoms will go away. Do not drive yourself to the hospital. Summary Hypertension is when the force of blood pumping through your arteries is too strong. If this condition is not controlled, it may put you at risk for serious complications. Your personal target blood pressure may vary depending on your medical conditions, your age, and other factors. For most people, a normal blood pressure is less than 120/80. Hypertension is treated with lifestyle changes, medicines, or a combination of both. Lifestyle changes include losing weight, eating a healthy,  low-sodium diet, exercising more, and limiting alcohol. This information is not intended to replace advice given to you by your health care provider. Make sure you discuss any questions you have with your health care provider. Document Revised: 05/27/2021 Document Reviewed: 05/27/2021 Elsevier Patient Education  2024 [employer].

## 2024-07-10 ENCOUNTER — Encounter: Payer: Self-pay | Admitting: Internal Medicine

## 2024-07-10 ENCOUNTER — Other Ambulatory Visit: Payer: Self-pay

## 2024-07-10 ENCOUNTER — Ambulatory Visit: Payer: Self-pay | Admitting: Internal Medicine

## 2024-07-10 VITALS — BP 122/84 | HR 91 | Temp 98.3°F | Ht 74.0 in | Wt 255.4 lb

## 2024-07-10 DIAGNOSIS — E6609 Other obesity due to excess calories: Secondary | ICD-10-CM

## 2024-07-10 DIAGNOSIS — R7309 Other abnormal glucose: Secondary | ICD-10-CM

## 2024-07-10 DIAGNOSIS — I110 Hypertensive heart disease with heart failure: Secondary | ICD-10-CM

## 2024-07-10 DIAGNOSIS — I5042 Chronic combined systolic (congestive) and diastolic (congestive) heart failure: Secondary | ICD-10-CM

## 2024-07-10 DIAGNOSIS — M1711 Unilateral primary osteoarthritis, right knee: Secondary | ICD-10-CM | POA: Insufficient documentation

## 2024-07-10 DIAGNOSIS — I700 Atherosclerosis of aorta: Secondary | ICD-10-CM

## 2024-07-10 DIAGNOSIS — E7800 Pure hypercholesterolemia, unspecified: Secondary | ICD-10-CM

## 2024-07-10 LAB — CMP14+EGFR
ALT: 24 IU/L (ref 0–44)
AST: 26 IU/L (ref 0–40)
Albumin: 3.7 g/dL — ABNORMAL LOW (ref 3.8–4.8)
Alkaline Phosphatase: 60 IU/L (ref 47–123)
BUN/Creatinine Ratio: 15 (ref 10–24)
BUN: 14 mg/dL (ref 8–27)
Bilirubin Total: <0.2 mg/dL (ref 0.0–1.2)
CO2: 26 mmol/L (ref 20–29)
Calcium: 9.9 mg/dL (ref 8.6–10.2)
Chloride: 102 mmol/L (ref 96–106)
Creatinine, Ser: 0.93 mg/dL (ref 0.76–1.27)
Globulin, Total: 3.1 g/dL (ref 1.5–4.5)
Glucose: 130 mg/dL — ABNORMAL HIGH (ref 70–99)
Potassium: 3.6 mmol/L (ref 3.5–5.2)
Sodium: 138 mmol/L (ref 134–144)
Total Protein: 6.8 g/dL (ref 6.0–8.5)
eGFR: 88 mL/min/1.73 (ref >59)

## 2024-07-10 LAB — CBC
Hematocrit: 42.7 % (ref 37.5–51.0)
Hemoglobin: 14 g/dL (ref 13.0–17.7)
MCH: 31.7 pg (ref 26.6–33.0)
MCHC: 32.8 g/dL (ref 31.5–35.7)
MCV: 97 fL (ref 79–97)
Platelets: 254 x10E3/uL (ref 150–450)
RBC: 4.41 x10E6/uL (ref 4.14–5.80)
RDW: 13.3 % (ref 11.6–15.4)
WBC: 6.2 x10E3/uL (ref 3.4–10.8)

## 2024-07-10 LAB — HEMOGLOBIN A1C
Est. average glucose Bld gHb Est-mCnc: 123 mg/dL
Hgb A1c MFr Bld: 5.9 % — ABNORMAL HIGH (ref 4.8–5.6)

## 2024-07-10 MED ORDER — ATORVASTATIN CALCIUM 10 MG PO TABS: ORAL_TABLET | ORAL | 2 refills | Status: AC

## 2024-07-10 MED ORDER — POTASSIUM CHLORIDE CRYS ER 20 MEQ PO TBCR: EXTENDED_RELEASE_TABLET | ORAL | 2 refills | Status: AC

## 2024-07-10 NOTE — Progress Notes (Signed)
 I,Terry Andrews, CMA,acting as a neurosurgeon for Terry LOISE Slocumb, MD.,have documented all relevant documentation on the behalf of Terry LOISE Slocumb, MD,as directed by  Terry LOISE Slocumb, MD while in the presence of Terry LOISE Slocumb, MD.  Subjective:  [name] , male    DOB: 1951/06/09 , 73 y.o.   MRN: [id number]  Chief Complaint  Patient presents with   Hypertension    Patient presents today for bp & cholesterol follow up. He reports compliance with medications. Denies headache, chest pain & sob.   Hyperlipidemia    HPI Discussed the use of AI scribe software for clinical note transcription with the patient, who gave verbal consent to proceed.  History of Present Illness Terry Andrews is a 73 year old male with hypertension and hyperlipidemia who presents for a blood pressure and cholesterol check.  He is currently taking aspirin , cholesterol medication on Monday, Wednesday, and Friday, edarbyclor  daily, spironolactone , and verapamil . His blood pressure was 118/76 mmHg this morning.  He wears a brace on his right knee and experiences significant pain and swelling, impacting his mobility and ability to care for his wife. He is concerned about potentially needing a wheelchair if the condition worsens.  He has a history of a hand injury approximately 30 years ago, which he suspects may have resulted in arthritis. He experiences occasional pain and swelling in the hand, particularly in the thumb, and sometimes uses a brace for support.  No shortness of breath with activities such as bringing in groceries.  He does not have shortness of breath when walking up steps.    Hypertension This is a chronic problem. The current episode started more than 1 year ago. The problem has been gradually improving since onset. The problem is controlled. Risk factors for coronary artery disease include obesity and male gender. Past treatments include diuretics, calcium  channel blockers and  angiotensin blockers. The current treatment provides moderate improvement.  Hyperlipidemia This is a chronic problem. The current episode started more than 1 year ago. Exacerbating diseases include obesity. Current antihyperlipidemic treatment includes statins.     Past Medical History:  Diagnosis Date   COPD (chronic obstructive pulmonary disease) (HCC)    Diabetes mellitus without complication (HCC) type 2    Per pt Borderline/pre-diabetic   GERD (gastroesophageal reflux disease)    H/O: CVA (cerebrovascular accident) 2003   Patient states the only residual deficit is an occasional stutter.   Hyperlipidemia    Hypertension    OSA (obstructive sleep apnea)    wears CPAP q night   Wears partial denture upper      Family History  Problem Relation Age of Onset   Cancer Mother    Stroke Father    Heart disease Father      Current Outpatient Medications:    aspirin  EC 81 MG tablet, Take 81 mg by mouth in the morning., Disp: , Rfl:    Azilsartan-Chlorthalidone (EDARBYCLOR ) 40-25 MG TABS, TAKE ONE TABLET BY MOUTH DAILY EVERY MORNING, Disp: 90 tablet, Rfl: 3   sildenafil (VIAGRA) 100 MG tablet, , Disp: , Rfl:    spironolactone  (ALDACTONE ) 25 MG tablet, TAKE 1 TABLET BY MOUTH IN THE  MORNING, Disp: 90 tablet, Rfl: 0   tobramycin-dexamethasone  (TOBRADEX) ophthalmic solution, Place 1 drop into the right eye 4 (four) times daily., Disp: , Rfl:    triamcinolone  ointment (KENALOG ) 0.1 %, Apply 1 application  topically 2 (two) times daily as needed (eczema)., Disp: , Rfl:  verapamil  (CALAN -SR) 240 MG CR tablet, TAKE 1 TABLET BY MOUTH TWICE  DAILY, Disp: 180 tablet, Rfl: 0   atorvastatin  (LIPITOR) 10 MG tablet, TAKE ONE TABLET ONCE DAILY BY MOUTH MONDAY, WEDNESDAY AND FRIDAY WTIH EVENING MEAL, Disp: 36 tablet, Rfl: 2   potassium chloride  SA (KLOR-CON  M) 20 MEQ tablet, TAKE 1&1/2 tablets by MOUTH daily, Disp: 135 tablet, Rfl: 2   VITAMIN D  PO, Take 1 capsule by mouth in the morning.  (Patient not taking: Reported on 07/10/2024), Disp: , Rfl:    No Known Allergies   Review of Systems  Constitutional: Negative.   Respiratory: Negative.    Cardiovascular: Negative.   Gastrointestinal: Negative.   Neurological: Negative.   Psychiatric/Behavioral: Negative.       Today's Vitals   07/10/24 1040  BP: 122/84  Pulse: 91  Temp: 98.3 F (36.8 C)  SpO2: 98%  Weight: 255 lb 6.4 oz (115.8 kg)  Height: 6' 2 (1.88 m)   Body mass index is 32.79 kg/m.  Wt Readings from Last 3 Encounters:  07/10/24 255 lb 6.4 oz (115.8 kg)  06/20/24 251 lb 6.4 oz (114 kg)  01/06/24 257 lb 6.4 oz (116.8 kg)     Objective:  Physical Exam Vitals and nursing note reviewed.  Constitutional:      Appearance: Normal appearance.  HENT:     Head: Normocephalic and atraumatic.  Eyes:     Extraocular Movements: Extraocular movements intact.  Cardiovascular:     Rate and Rhythm: Normal rate and regular rhythm.     Heart sounds: Normal heart sounds.  Pulmonary:     Effort: Pulmonary effort is normal.     Breath sounds: Normal breath sounds.  Musculoskeletal:     Cervical back: Normal range of motion.     Comments: R>L crepitus  Skin:    General: Skin is warm.  Neurological:     General: No focal deficit present.     Mental Status: He is alert.  Psychiatric:        Mood and Affect: Mood normal.       Assessment And Plan:  Hypertensive heart disease with heart failure (HCC) Assessment & Plan: Chronic, well controlled. Blood pressure controlled with current medications. - Continue edarbyclor , spironolactone , and verapamil . - Follow low sodium diet.  - He will f/u in six months.   Orders: -     CMP14+EGFR -     CBC  Chronic combined systolic and diastolic CHF (congestive heart failure) (HCC) Assessment & Plan: Most recent echo reviewed. Importance of dietary/medication compliance was discussed with the patient. He is also followed by Cardiology.  - encouraged to follow low  sodium diet   Atherosclerosis of aorta Assessment & Plan: Chronic, encouraged to follow heart healthy lifestyle. Clean eating, regular exercise and stress management are all a part of this lifestyle. She is also encouraged to continue with statin therapy.     Pure hypercholesterolemia Assessment & Plan: Chronic, LDL goal is less than 70 due to aortic atherosclerosis. He will continue with atorvastatin  MWF without any issues.  Last LDL was 37, no need to recheck today.  - Follow heart healthy lifestyle.   Orders: -     CMP14+EGFR  Primary osteoarthritis of right knee Assessment & Plan: Significant pain and swelling affecting mobility. Pending clearance for knee replacement. - Coordinate with orthopedic surgeon for surgery scheduling   Other abnormal glucose Assessment & Plan: Previous labs reviewed, his A1c has been elevated in the past. I will check  an A1c today. Reminded to avoid refined sugars including sugary drinks/foods and processed meats including bacon, sausages and deli meats.    Orders: -     Hemoglobin A1c  Class 1 obesity due to excess calories with serious comorbidity and body mass index (BMI) of 32.0 to 32.9 in adult Assessment & Plan: He is encouraged to initially strive for BMI less than 30 to decrease cardiac risk. He is advised to exercise no less than 150 minutes per week.      Return if symptoms worsen or fail to improve.  Patient was given opportunity to ask questions. Patient verbalized understanding of the plan and was able to repeat key elements of the plan. All questions were answered to their satisfaction.   I, Terry LOISE Slocumb, MD, have reviewed all documentation for this visit. The documentation on 07/10/24 for the exam, diagnosis, procedures, and orders are all accurate and complete.   IF YOU HAVE BEEN REFERRED TO A SPECIALIST, IT MAY TAKE 1-2 WEEKS TO SCHEDULE/PROCESS THE REFERRAL. IF YOU HAVE NOT HEARD FROM US /SPECIALIST IN TWO WEEKS, PLEASE GIVE  US  A CALL AT [phone].   THE PATIENT IS ENCOURAGED TO PRACTICE SOCIAL DISTANCING DUE TO THE COVID-19 PANDEMIC.

## 2024-07-10 NOTE — Assessment & Plan Note (Signed)
 He is encouraged to initially strive for BMI less than 30 to decrease cardiac risk. He is advised to exercise no less than 150 minutes per week.

## 2024-07-10 NOTE — Assessment & Plan Note (Signed)
 Chronic, well controlled. Blood pressure controlled with current medications. - Continue edarbyclor , spironolactone , and verapamil . - Follow low sodium diet.  - He will f/u in six months.

## 2024-07-10 NOTE — Assessment & Plan Note (Signed)
 Chronic, LDL goal is less than 70 due to aortic atherosclerosis. He will continue with atorvastatin  MWF without any issues.  Last LDL was 37, no need to recheck today.  - Follow heart healthy lifestyle.

## 2024-07-10 NOTE — Assessment & Plan Note (Signed)
 Chronic, encouraged to follow heart healthy lifestyle. Clean eating, regular exercise and stress management are all a part of this lifestyle. She is also encouraged to continue with statin therapy.

## 2024-07-10 NOTE — Assessment & Plan Note (Signed)
 Previous labs reviewed, his A1c has been elevated in the past. I will check an A1c today. Reminded to avoid refined sugars including sugary drinks/foods and processed meats including bacon, sausages and deli meats.

## 2024-07-10 NOTE — Assessment & Plan Note (Signed)
 Significant pain and swelling affecting mobility. Pending clearance for knee replacement. - Coordinate with orthopedic surgeon for surgery scheduling

## 2024-07-11 NOTE — Assessment & Plan Note (Signed)
 Most recent echo reviewed. Importance of dietary/medication compliance was discussed with the patient. He is also followed by Cardiology.  - encouraged to follow low sodium diet

## 2024-07-12 ENCOUNTER — Ambulatory Visit: Payer: Self-pay | Admitting: Internal Medicine

## 2024-07-31 ENCOUNTER — Ambulatory Visit: Attending: Cardiovascular Disease | Admitting: Physician Assistant

## 2024-07-31 DIAGNOSIS — Z01810 Encounter for preprocedural cardiovascular examination: Secondary | ICD-10-CM | POA: Diagnosis not present

## 2024-07-31 NOTE — Progress Notes (Signed)
 "   Virtual Visit via Telephone Note   Because of Terry Andrews co-morbid illnesses, he is at least at moderate risk for complications without adequate follow up.  This format is felt to be most appropriate for this patient at this time.  Due to technical limitations with video connection (technology), today's appointment will be conducted as an audio only telehealth visit, and Terry Andrews verbally agreed to proceed in this manner.   All issues noted in this document were discussed and addressed.  No physical exam could be performed with this format.  Evaluation Performed:  Preoperative cardiovascular risk assessment _____________   Date:  07/31/2024   Patient ID:  Terry Andrews, DOB [birth year], MRN [id number] Patient Location:  Home Provider location:   Office  Primary Care Provider:  Jarold Medici, MD Primary Cardiologist:  Gordy Bergamo, MD  Chief Complaint / Patient Profile   73 y.o. y/o male with a h/o HTN, HLD, OSA, CVA who is pending right total knee arthoplasty and presents today for telephonic preoperative cardiovascular risk assessment.  History of Present Illness    Terry Andrews is a 73 y.o. male who presents via audio/video conferencing for a telehealth visit today.  Pt was last seen in cardiology clinic on 09/24/23 by Dr. Bergamo.  At that time Terry Andrews was doing well .  The patient is now pending procedure as outlined above. Since his last visit, he tells me that he has not had any chest pain or shortness of breath.  No new issues with his heart to his knowledge.  He is unfortunately still smoking but plans to quit cold turkey prior to surgery.  He does meet minimum METS on the DASI.  Patient's aspirin  is prescribed for history of CVA. Recommendations for holding medication will need to come from prescribing provider.   Past Medical History    Past Medical History:  Diagnosis Date   COPD (chronic obstructive pulmonary disease) (HCC)    Diabetes mellitus without  complication (HCC) type 2    Per pt Borderline/pre-diabetic   GERD (gastroesophageal reflux disease)    H/O: CVA (cerebrovascular accident) 2003   Patient states the only residual deficit is an occasional stutter.   Hyperlipidemia    Hypertension    OSA (obstructive sleep apnea)    wears CPAP q night   Wears partial denture upper    Past Surgical History:  Procedure Laterality Date   ANAL FISTULOTOMY N/A 05/29/2021   Procedure: WIDE EXCISION OF HYDRADINITIS;  Surgeon: Debby Hila, MD;  Location: [facility];  Service: General;  Laterality: N/A;   COLONOSCOPY  12/21/2019   HYDROCELE EXCISION Right 02/05/2023   Procedure: OPEN HYDROCELECTOMY ADULT;  Surgeon: Alvaro Ricardo KATHEE Mickey., MD;  Location: [facility];  Service: Urology;  Laterality: Right;   LEFT HEART CATH AND CORONARY ANGIOGRAPHY N/A 08/14/2022   Procedure: LEFT HEART CATH AND CORONARY ANGIOGRAPHY;  Surgeon: Bergamo Gordy, MD;  Location: [facility];  Service: Cardiovascular;  Laterality: N/A;   ORCHIOPEXY Right 02/05/2023   Procedure: ORCHIOPEXY ADULT;  Surgeon: Alvaro Ricardo KATHEE Mickey., MD;  [facility];  Service: Urology;  Laterality: Right;    Allergies  Allergies[1]  Home Medications    Prior to Admission medications  Medication Sig Start Date End Date Taking? Authorizing Provider  aspirin  EC 81 MG tablet Take 81 mg by mouth in the morning.    [provider]  atorvastatin  (LIPITOR) 10 MG tablet  TAKE ONE TABLET ONCE DAILY BY MOUTH MONDAY, WEDNESDAY AND FRIDAY WTIH EVENING MEAL 07/10/24   Jarold Medici, MD  Azilsartan-Chlorthalidone (EDARBYCLOR ) 40-25 MG TABS TAKE ONE TABLET BY MOUTH DAILY EVERY MORNING 09/27/23   Ladona Heinz, MD  potassium chloride  SA (KLOR-CON  M) 20 MEQ tablet TAKE 1&1/2 tablets by MOUTH daily 07/10/24   Jarold Medici, MD  sildenafil (VIAGRA) 100 MG tablet  02/08/23   [provider]  spironolactone  (ALDACTONE ) 25 MG tablet TAKE  1 TABLET BY MOUTH IN THE  MORNING 06/13/24   Ladona Heinz, MD  tobramycin-dexamethasone  [facility]) ophthalmic solution Place 1 drop into the right eye 4 (four) times daily. 08/12/23   [provider]  triamcinolone  ointment (KENALOG ) 0.1 % Apply 1 application  topically 2 (two) times daily as needed (eczema).    [provider]  verapamil  (CALAN -SR) 240 MG CR tablet TAKE 1 TABLET BY MOUTH TWICE  DAILY 06/13/24   Ladona Heinz, MD  VITAMIN D  PO Take 1 capsule by mouth in the morning. Patient not taking: Reported on 07/10/2024    [provider]    Physical Exam    Vital Signs:  Terry Andrews does not have vital signs available for review today.  Given telephonic nature of communication, physical exam is limited. AAOx3. NAD. Normal affect.  Speech and respirations are unlabored.  Accessory Clinical Findings    None  Assessment & Plan    1.  Preoperative Cardiovascular Risk Assessment:  Terry Andrews perioperative risk of a major cardiac event is 0.9% according to the Revised Cardiac Risk Index (RCRI).  Therefore, he is at low risk for perioperative complications.   His functional capacity is fair at 4.61 METs according to the [employer] Activity Status Index (DASI). Recommendations: According to ACC/AHA guidelines, no further cardiovascular testing needed.  The patient may proceed to surgery at acceptable risk.    The patient was advised that if he develops new symptoms prior to surgery to contact our office to arrange for a follow-up visit, and he verbalized understanding.   A copy of this note will be routed to requesting surgeon.  Time:   Today, I have spent 6 minutes with the patient with telehealth technology discussing medical history, symptoms, and management plan.     Terry LOISE Fabry, PA-C  07/31/2024, 10:27 AM     [1] No Known Allergies  "

## 2024-08-25 NOTE — Progress Notes (Signed)
 Terry Andrews                                          MRN: [id number]   08/25/2024   The [facility] Quality Team Specialist reviewed this patient medical record for the purposes of chart review for care gap closure. The following were reviewed: abstraction for care gap closure-glycemic status assessment.    [facility] Quality Team

## 2024-09-04 ENCOUNTER — Other Ambulatory Visit: Payer: Self-pay | Admitting: Cardiology

## 2024-09-25 ENCOUNTER — Ambulatory Visit (HOSPITAL_COMMUNITY): Admit: 2024-09-25 | Admitting: Orthopaedic Surgery

## 2024-09-25 DIAGNOSIS — M1711 Unilateral primary osteoarthritis, right knee: Secondary | ICD-10-CM

## 2024-09-25 SURGERY — ARTHROPLASTY, KNEE, TOTAL
Anesthesia: Spinal | Site: Knee | Laterality: Right

## 2024-10-10 ENCOUNTER — Encounter: Admitting: Physician Assistant

## 2025-01-09 ENCOUNTER — Encounter: Payer: Self-pay | Admitting: Internal Medicine

## 2025-03-07 ENCOUNTER — Ambulatory Visit: Payer: Self-pay
